# Patient Record
Sex: Male | Born: 1970 | Race: White | Hispanic: No | Marital: Married | Smoking: Former smoker
Health system: Southern US, Community
[De-identification: ages and names within clinical notes are randomized; demographics above are authoritative.]

## PROBLEM LIST (undated history)

## (undated) DIAGNOSIS — R519 Headache, unspecified: Secondary | ICD-10-CM

## (undated) DIAGNOSIS — Z9889 Other specified postprocedural states: Secondary | ICD-10-CM

## (undated) DIAGNOSIS — R112 Nausea with vomiting, unspecified: Secondary | ICD-10-CM

## (undated) DIAGNOSIS — E349 Endocrine disorder, unspecified: Secondary | ICD-10-CM

## (undated) DIAGNOSIS — F32A Depression, unspecified: Secondary | ICD-10-CM

## (undated) DIAGNOSIS — M1990 Unspecified osteoarthritis, unspecified site: Secondary | ICD-10-CM

## (undated) DIAGNOSIS — K5900 Constipation, unspecified: Secondary | ICD-10-CM

## (undated) DIAGNOSIS — T4145XA Adverse effect of unspecified anesthetic, initial encounter: Secondary | ICD-10-CM

## (undated) DIAGNOSIS — I10 Essential (primary) hypertension: Secondary | ICD-10-CM

## (undated) DIAGNOSIS — G4730 Sleep apnea, unspecified: Secondary | ICD-10-CM

## (undated) DIAGNOSIS — F319 Bipolar disorder, unspecified: Secondary | ICD-10-CM

## (undated) DIAGNOSIS — T8859XA Other complications of anesthesia, initial encounter: Secondary | ICD-10-CM

## (undated) DIAGNOSIS — S0990XA Unspecified injury of head, initial encounter: Secondary | ICD-10-CM

## (undated) DIAGNOSIS — K219 Gastro-esophageal reflux disease without esophagitis: Secondary | ICD-10-CM

## (undated) DIAGNOSIS — F419 Anxiety disorder, unspecified: Secondary | ICD-10-CM

## (undated) DIAGNOSIS — F909 Attention-deficit hyperactivity disorder, unspecified type: Secondary | ICD-10-CM

## (undated) DIAGNOSIS — F99 Mental disorder, not otherwise specified: Secondary | ICD-10-CM

## (undated) DIAGNOSIS — F329 Major depressive disorder, single episode, unspecified: Secondary | ICD-10-CM

## (undated) HISTORY — PX: WRIST ARTHROSCOPY: SHX838

## (undated) HISTORY — PX: WRIST SURGERY: SHX841

## (undated) HISTORY — PX: VASECTOMY: SHX75

## (undated) HISTORY — PX: HAND SURGERY: SHX662

## (undated) HISTORY — PX: MICRODISCECTOMY LUMBAR: SUR864

---

## 1996-03-14 HISTORY — PX: WRIST FUSION: SHX839

## 2000-02-28 ENCOUNTER — Ambulatory Visit: Admission: RE | Admit: 2000-02-28 | Discharge: 2000-02-28 | Payer: Self-pay | Admitting: Family Medicine

## 2000-03-03 ENCOUNTER — Encounter: Payer: Self-pay | Admitting: Family Medicine

## 2000-03-03 ENCOUNTER — Ambulatory Visit (HOSPITAL_COMMUNITY): Admission: RE | Admit: 2000-03-03 | Discharge: 2000-03-03 | Payer: Self-pay | Admitting: Family Medicine

## 2000-10-17 ENCOUNTER — Encounter: Admission: RE | Admit: 2000-10-17 | Discharge: 2000-10-17 | Payer: Self-pay | Admitting: Family Medicine

## 2000-10-17 ENCOUNTER — Encounter: Payer: Self-pay | Admitting: Family Medicine

## 2005-08-23 ENCOUNTER — Encounter: Admission: RE | Admit: 2005-08-23 | Discharge: 2005-08-23 | Payer: Self-pay | Admitting: Family Medicine

## 2007-04-24 ENCOUNTER — Encounter: Admission: RE | Admit: 2007-04-24 | Discharge: 2007-04-24 | Payer: Self-pay | Admitting: Family Medicine

## 2008-03-14 ENCOUNTER — Emergency Department (HOSPITAL_COMMUNITY): Admission: EM | Admit: 2008-03-14 | Discharge: 2008-03-15 | Payer: Self-pay | Admitting: Emergency Medicine

## 2008-03-27 ENCOUNTER — Ambulatory Visit (HOSPITAL_COMMUNITY): Admission: RE | Admit: 2008-03-27 | Discharge: 2008-03-27 | Payer: Self-pay | Admitting: Family Medicine

## 2009-12-12 ENCOUNTER — Emergency Department (HOSPITAL_COMMUNITY): Admission: EM | Admit: 2009-12-12 | Discharge: 2009-12-12 | Payer: Self-pay | Admitting: Emergency Medicine

## 2010-03-01 ENCOUNTER — Ambulatory Visit (HOSPITAL_COMMUNITY)
Admission: RE | Admit: 2010-03-01 | Discharge: 2010-03-02 | Payer: Self-pay | Source: Home / Self Care | Attending: Orthopaedic Surgery | Admitting: Orthopaedic Surgery

## 2010-04-22 ENCOUNTER — Emergency Department (HOSPITAL_COMMUNITY)
Admission: EM | Admit: 2010-04-22 | Discharge: 2010-04-23 | Disposition: A | Discharge status: Other Acute Inpatient Hospital | Payer: BC Managed Care – PPO | Attending: Emergency Medicine | Admitting: Emergency Medicine

## 2010-04-22 DIAGNOSIS — Z79899 Other long term (current) drug therapy: Secondary | ICD-10-CM | POA: Insufficient documentation

## 2010-04-22 DIAGNOSIS — R45851 Suicidal ideations: Secondary | ICD-10-CM | POA: Insufficient documentation

## 2010-04-22 DIAGNOSIS — F3112 Bipolar disorder, current episode manic without psychotic features, moderate: Secondary | ICD-10-CM

## 2010-04-22 DIAGNOSIS — F329 Major depressive disorder, single episode, unspecified: Secondary | ICD-10-CM | POA: Insufficient documentation

## 2010-04-22 DIAGNOSIS — F3289 Other specified depressive episodes: Secondary | ICD-10-CM | POA: Insufficient documentation

## 2010-04-22 LAB — COMPREHENSIVE METABOLIC PANEL
BUN: 10 mg/dL (ref 6–23)
CO2: 29 mEq/L (ref 19–32)
Calcium: 9.6 mg/dL (ref 8.4–10.5)
Creatinine, Ser: 1.18 mg/dL (ref 0.4–1.5)
GFR calc Af Amer: >60 mL/min (ref >60)
GFR calc non Af Amer: >60 mL/min (ref >60)
Glucose, Bld: 89 mg/dL (ref 70–99)

## 2010-04-22 LAB — CBC
MCV: 89.7 fL (ref 78.0–100.0)
Platelets: 228 10*3/uL (ref 150–400)
RBC: 4.55 MIL/uL (ref 4.22–5.81)
WBC: 5 10*3/uL (ref 4.0–10.5)

## 2010-04-22 LAB — RAPID URINE DRUG SCREEN, HOSP PERFORMED
Barbiturates: NOT DETECTED
Benzodiazepines: POSITIVE — AB
Cocaine: NOT DETECTED

## 2010-04-22 LAB — DIFFERENTIAL
Eosinophils Absolute: 0.1 10*3/uL (ref 0.0–0.7)
Lymphocytes Relative: 40 % (ref 12–46)
Lymphs Abs: 2 10*3/uL (ref 0.7–4.0)
Neutrophils Relative %: 49 % (ref 43–77)

## 2010-04-22 LAB — ETHANOL: Alcohol, Ethyl (B): <5 mg/dL (ref 0–10)

## 2010-04-23 ENCOUNTER — Inpatient Hospital Stay (HOSPITAL_COMMUNITY)
Admission: AD | Admit: 2010-04-23 | Discharge: 2010-04-27 | DRG: 430 | Disposition: A | Discharge status: Home / Self Care | Payer: BC Managed Care – PPO | Source: Ambulatory Visit | Attending: Psychiatry | Admitting: Psychiatry

## 2010-04-23 DIAGNOSIS — M549 Dorsalgia, unspecified: Secondary | ICD-10-CM

## 2010-04-23 DIAGNOSIS — R45851 Suicidal ideations: Secondary | ICD-10-CM

## 2010-04-23 DIAGNOSIS — R45850 Homicidal ideations: Secondary | ICD-10-CM

## 2010-04-23 DIAGNOSIS — Z6379 Other stressful life events affecting family and household: Secondary | ICD-10-CM

## 2010-04-23 DIAGNOSIS — G8929 Other chronic pain: Secondary | ICD-10-CM

## 2010-04-23 DIAGNOSIS — F39 Unspecified mood [affective] disorder: Principal | ICD-10-CM

## 2010-04-23 LAB — VALPROIC ACID LEVEL: Valproic Acid Lvl: 76.6 ug/mL (ref 50.0–100.0)

## 2010-04-24 DIAGNOSIS — F39 Unspecified mood [affective] disorder: Secondary | ICD-10-CM

## 2010-04-26 ENCOUNTER — Emergency Department (HOSPITAL_COMMUNITY)
Admission: EM | Admit: 2010-04-26 | Discharge: 2010-04-26 | Disposition: A | Discharge status: Other Acute Inpatient Hospital | Payer: BC Managed Care – PPO | Attending: Emergency Medicine | Admitting: Emergency Medicine

## 2010-04-26 ENCOUNTER — Emergency Department (HOSPITAL_COMMUNITY): Payer: BC Managed Care – PPO

## 2010-04-26 DIAGNOSIS — Y9367 Activity, basketball: Secondary | ICD-10-CM | POA: Insufficient documentation

## 2010-04-26 DIAGNOSIS — Y9210 Unspecified residential institution as the place of occurrence of the external cause: Secondary | ICD-10-CM | POA: Insufficient documentation

## 2010-04-26 DIAGNOSIS — F319 Bipolar disorder, unspecified: Secondary | ICD-10-CM | POA: Insufficient documentation

## 2010-04-26 DIAGNOSIS — IMO0002 Reserved for concepts with insufficient information to code with codable children: Secondary | ICD-10-CM | POA: Insufficient documentation

## 2010-04-26 DIAGNOSIS — W219XXA Striking against or struck by unspecified sports equipment, initial encounter: Secondary | ICD-10-CM | POA: Insufficient documentation

## 2010-05-02 NOTE — H&P (Signed)
NAMENOELLE, [name]                ACCOUNT NO.:  [id number]  MEDICAL RECORD NO.:  [id number]           PATIENT TYPE:  I  LOCATION:  0503                          FACILITY:  [facility]  PHYSICIAN:  Marlis Edelson, DO        DATE OF BIRTH:  May 09, 1970  DATE OF ADMISSION:  04/23/2010 DATE OF DISCHARGE:                      PSYCHIATRIC ADMISSION ASSESSMENT   This is a voluntary admission to the services of Dr. Huston Foley.  This is a 40 year old married white male.  He presented to the emergency department at [facility].  He reported that he was having an increase in anxiety and panic attacks.  He stated he was suicidal and homicidal without a plan.  He is employed as an Stage manager at [employer] and he had to leave a meeting because he felt like he was going to harm his coworkers. He has been having thoughts of hurting individuals who may look at him wrong or say the wrong things the wrong time.  He states he is at the end of a very tight rope and he is trying to hold on but he is losing his grip.  He is status post an L4-L5 microdiskectomy on March 01, 2010.  He has been totally off of pain pills now about 3 weeks but he states that his sleep is still off.  He is barely getting 6 hours of sleep a night and this is with Seroquel and Xanax.  His Depakote was cut back about 3 weeks ago because at 2000 mg a day he gets severe tremor. His tremor is better since decreasing it to 1500 mg and his Depakote level is therapeutic at 76.6.  PAST PSYCHIATRIC HISTORY:  He has seen Valinda Hoar, nurse practitioner, for mood disorder complaints for a number of years.  He is not sure how long.  He has 20 years ago cut his wrists when he was upset.  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  He is married twice, this time for 12 years.  He has a 49 year old daughter who is up at [employer] and a 72 year old son who lives with the patient.  He is employed as an Environmental consultant at  [employer].  FAMILY HISTORY:  His father and grandfather were alcoholics.  His grandmother was a substance abuse person.  ALCOHOL AND DRUG HISTORY:  He denies anything other than occasional social beer, one 12 ounce beer maybe 4 times a month.  PRIMARY CARE PROVIDER:  [facility] Sprang.  MEDICAL PROBLEMS:  He has chronic back pain for which he had an L4-L5 microdiskectomy on March 01, 2010.  MEDICATIONS: 1. His currently prescribed meds are Depakote 1500 mg at h.s. 2. Seroquel 25 mg at h.s. 3. Xanax 0.5 to 1 mg p.o. daily.  ALLERGIES:  He has no known drug allergies.  POSITIVE PHYSICAL FINDINGS:  He was afebrile.  His temperature was 97.4 to 98.3.  His pulse was 72 to 108, respirations were 18 to 20 and his blood pressure ranged from 113/74 to 131/84.  His Depakote level was therapeutic at 76.6.  He had no abnormalities of his BMET  nor his CBC. His UDS was positive for benzodiazepines but he is prescribed and he had no measurable alcohol.  He does walk gingerly, he is still having some back discomfort.  MENTAL STATUS EXAM:  Today he is alert and oriented.  He is appropriately groomed, dressed and nourished in his own clothing.  His speech is not pressured.  His mood is anxiously depressed.  Thought processes are clear, rational and goal oriented.  He is starting to second guess whether it was wise to be admitted.  Judgment and insight are intact.  Concentration and memory are intact.  Intelligence is at least average.  DIAGNOSES:  AXIS I:  Mood disorder not otherwise specified. AXIS II:  None. AXIS III:  Chronic back pain status post microdiskectomy L4-L5 on March 01, 2010. AXIS IV:  Moderate, some conflict at work. AXIS V:  36.  PLAN:  To admit for safety and stabilization.  Medication adjustments will be made as indicated.  He has taken Neurontin in the past with benefit and we are going to go ahead and restart that at 300 mg q.4 h while awake.  He was seen in the emergency  department by Dr. Rogers Blocker and Risperdal 1 mg p.o. b.i.d. was added to his regimen.  Estimated length of stay is 3 to 5 days.     Mickie Leonarda Salon, P.A.-C.   ______________________________ Marlis Edelson, DO    MD/MEDQ  D:  04/24/2010  T:  04/24/2010  Job:  [id number]  Electronically Signed by Jaci Lazier ADAMS P.A.-C. on 05/02/2010 12:36:38 PM Electronically Signed by Marlis Edelson MD on 05/02/2010 07:33:32 PM

## 2010-05-05 NOTE — Discharge Summary (Signed)
NAMENICHOALS, HEYDE NO.:  [id number]  MEDICAL RECORD NO.:  [id number]          PATIENT TYPE:  LOCATION:                                 FACILITY:  PHYSICIAN:  Marlis Edelson, DO             DATE OF BIRTH:  DATE OF ADMISSION:  04/23/2010 DATE OF DISCHARGE:                              DISCHARGE SUMMARY   PATIENT IDENTIFICATION:  This is a 40 year old male that was voluntarily admitted on 02/10-02/2011.  REASON FOR ADMISSION:  This is a of voluntary admission where patient reporting increased anxiety and panic attacks, suicidal and homicidal thoughts without a plan.  He was having thoughts of hurting individuals at work who he felt looked at him wrong or said the wrong things at the wrong time.  He was feeling very stressed, having mood swings.  Sleep has been poor.  The patient had been in outpatient treatment with Valinda Hoar for a history of mood disorder.  PERTINENT LABS:  Valproic acid level was at 76.6.  Alcohol level was less than 5.  His CMP was within normal limits.  Urine drug screen was positive for benzodiazepines.  FINAL DIAGNOSIS:  Mood disorder NOS.  SIGNIFICANT FINDINGS:  This is a middle-aged male who was casually dressed with good eye contact.  Normal motor behavior with normal speech, not pressured, not slurred.  Fully alert and appropriate affect. Had good and judgment and present insight.  Started the patient on Risperdal and added Rozerem for sleep.  The patient's mood became more stable.  He felt more composed.  PROCEDURES PERFORM AND TREATMENT RENDERED:  The patient had to go to the emergency room on February 13.  The patient was playing basketball and had thought his finger was broken.  He went to the emergency department and x-rays showed a fracture base of the middle phalanx little finger left hand.  A splint was applied.  The patient did receive a prescription for some pain medication with follow-up with a  hand specialist.  CONDITION OF THE PATIENT ON DISCHARGE:  The patient was fully alert, cooperative, casually dressed, good eye contact, was feeling much better with the medication.  Slept better on the Rozerem.  Felt more composed and more relaxed.  He was fully coherent.  No signs of mania or hypomania.  Did not appear to be overtly anxious or in any significant distress from the fracture.  The patient was seen by Dr. Huston Foley.  Mood was improved.  He felt he was doing well with his medications, and we felt the patient was stable for discharge.  DISCHARGE MEDICATIONS: 1. Neurontin 300 mg q.i.d. 2. Risperdal 1 mg daily, 2 mg at bedtime. 3. Naproxen 500 mg twice a day p.r.n. for pain. 4. Remeron 8 mg for sleep. 5. Depakote 1500 mg q.h.s.  FOLLOWUP:  The patient had an appointment with Valinda Hoar nurse practitioner on Wednesday February 22, and the patient was advised to see his hand specialist.  The patient reported having his own hand specialist that he would rather follow up with in 3 days for fracture of his  finger.  We also provided instructions in the emergency room, the patient requested a prescription for his pain medicine on discharge which we as well provided to the patient.  The patient was advised to return to the emergency department if he was having suicidal or homicidal thoughts or psychotic symptoms.     Landry Corporal, N.P.   ______________________________ Marlis Edelson, DO    JO/MEDQ  D:  04/28/2010  T:  04/28/2010  Job:  [id number]  Electronically Signed by Limmie PatriciaP. on 05/03/2010 09:49:54 AM Electronically Signed by Marlis Edelson MD on 05/05/2010 08:36:44 PM

## 2010-05-24 LAB — PROTIME-INR
INR: 0.94 (ref 0.00–1.49)
Prothrombin Time: 12.8 seconds (ref 11.6–15.2)

## 2010-05-24 LAB — BASIC METABOLIC PANEL
Calcium: 9.7 mg/dL (ref 8.4–10.5)
GFR calc Af Amer: >60 mL/min (ref >60)
GFR calc non Af Amer: >60 mL/min (ref >60)
Sodium: 142 mEq/L (ref 135–145)

## 2010-05-24 LAB — CBC
Hemoglobin: 14.2 g/dL (ref 13.0–17.0)
Platelets: 218 10*3/uL (ref 150–400)
RBC: 4.49 MIL/uL (ref 4.22–5.81)

## 2010-05-24 LAB — SURGICAL PCR SCREEN: MRSA, PCR: NEGATIVE

## 2010-06-28 LAB — CBC
HCT: 41.6 % (ref 39.0–52.0)
MCHC: 34.6 g/dL (ref 30.0–36.0)
Platelets: 244 10*3/uL (ref 150–400)
RDW: 13 % (ref 11.5–15.5)

## 2010-06-28 LAB — URINALYSIS, ROUTINE W REFLEX MICROSCOPIC
Bilirubin Urine: NEGATIVE
Glucose, UA: NEGATIVE mg/dL
Ketones, ur: 15 mg/dL — AB
Nitrite: NEGATIVE
Protein, ur: NEGATIVE mg/dL

## 2010-06-28 LAB — COMPREHENSIVE METABOLIC PANEL
Albumin: 4.1 g/dL (ref 3.5–5.2)
Alkaline Phosphatase: 55 U/L (ref 39–117)
BUN: 21 mg/dL (ref 6–23)
Calcium: 8.9 mg/dL (ref 8.4–10.5)
Glucose, Bld: 115 mg/dL — ABNORMAL HIGH (ref 70–99)
Potassium: 3.8 mEq/L (ref 3.5–5.1)
Total Protein: 6.7 g/dL (ref 6.0–8.3)

## 2010-06-28 LAB — DIFFERENTIAL
Lymphocytes Relative: 36 % (ref 12–46)
Lymphs Abs: 1.9 10*3/uL (ref 0.7–4.0)
Monocytes Absolute: 0.5 10*3/uL (ref 0.1–1.0)
Monocytes Relative: 9 % (ref 3–12)
Neutro Abs: 2.8 10*3/uL (ref 1.7–7.7)
Neutrophils Relative %: 52 % (ref 43–77)

## 2010-06-28 LAB — VALPROIC ACID LEVEL: Valproic Acid Lvl: 59.5 ug/mL (ref 50.0–100.0)

## 2010-10-12 ENCOUNTER — Other Ambulatory Visit: Payer: Self-pay | Admitting: Gastroenterology

## 2010-10-12 DIAGNOSIS — R112 Nausea with vomiting, unspecified: Secondary | ICD-10-CM

## 2010-10-12 DIAGNOSIS — K921 Melena: Secondary | ICD-10-CM

## 2010-10-12 DIAGNOSIS — R197 Diarrhea, unspecified: Secondary | ICD-10-CM

## 2010-10-15 ENCOUNTER — Ambulatory Visit
Admission: RE | Admit: 2010-10-15 | Discharge: 2010-10-15 | Disposition: A | Discharge status: Home / Self Care | Payer: BC Managed Care – PPO | Source: Ambulatory Visit | Attending: Gastroenterology | Admitting: Gastroenterology

## 2010-10-15 DIAGNOSIS — K921 Melena: Secondary | ICD-10-CM

## 2010-10-15 DIAGNOSIS — R112 Nausea with vomiting, unspecified: Secondary | ICD-10-CM

## 2010-10-15 DIAGNOSIS — R197 Diarrhea, unspecified: Secondary | ICD-10-CM

## 2010-10-15 MED ORDER — IOHEXOL 300 MG/ML  SOLN
125.0000 mL | Freq: Once | INTRAMUSCULAR | Status: AC | PRN
  Administered 2010-10-15: 125 mL via INTRAVENOUS

## 2011-03-22 ENCOUNTER — Other Ambulatory Visit: Payer: Self-pay | Admitting: Psychiatry

## 2011-03-22 DIAGNOSIS — R51 Headache: Secondary | ICD-10-CM

## 2011-03-24 ENCOUNTER — Ambulatory Visit
Admission: RE | Admit: 2011-03-24 | Discharge: 2011-03-24 | Disposition: A | Discharge status: Home / Self Care | Payer: BC Managed Care – PPO | Source: Ambulatory Visit | Attending: Psychiatry | Admitting: Psychiatry

## 2011-03-24 ENCOUNTER — Other Ambulatory Visit: Payer: BC Managed Care – PPO

## 2011-03-24 DIAGNOSIS — R51 Headache: Secondary | ICD-10-CM

## 2011-03-24 MED ORDER — GADOBENATE DIMEGLUMINE 529 MG/ML IV SOLN
20.0000 mL | Freq: Once | INTRAVENOUS | Status: AC | PRN
  Administered 2011-03-24: 20 mL via INTRAVENOUS

## 2011-03-26 ENCOUNTER — Other Ambulatory Visit: Payer: BC Managed Care – PPO

## 2011-07-22 ENCOUNTER — Emergency Department (HOSPITAL_COMMUNITY): Payer: BC Managed Care – PPO

## 2011-07-22 ENCOUNTER — Encounter (HOSPITAL_COMMUNITY): Payer: Self-pay | Admitting: *Deleted

## 2011-07-22 ENCOUNTER — Emergency Department (HOSPITAL_COMMUNITY)
Admission: EM | Admit: 2011-07-22 | Discharge: 2011-07-23 | Disposition: A | Discharge status: Home / Self Care | Payer: BC Managed Care – PPO | Attending: Emergency Medicine | Admitting: Emergency Medicine

## 2011-07-22 DIAGNOSIS — R5381 Other malaise: Secondary | ICD-10-CM | POA: Insufficient documentation

## 2011-07-22 DIAGNOSIS — R5383 Other fatigue: Secondary | ICD-10-CM | POA: Insufficient documentation

## 2011-07-22 DIAGNOSIS — R079 Chest pain, unspecified: Secondary | ICD-10-CM | POA: Insufficient documentation

## 2011-07-22 DIAGNOSIS — Z79899 Other long term (current) drug therapy: Secondary | ICD-10-CM | POA: Insufficient documentation

## 2011-07-22 DIAGNOSIS — R209 Unspecified disturbances of skin sensation: Secondary | ICD-10-CM | POA: Insufficient documentation

## 2011-07-22 LAB — POCT I-STAT TROPONIN I: Troponin i, poc: 0 ng/mL (ref 0.00–0.08)

## 2011-07-22 LAB — POCT I-STAT, CHEM 8
BUN: 15 mg/dL (ref 6–23)
Chloride: 102 mEq/L (ref 96–112)
Creatinine, Ser: 1 mg/dL (ref 0.50–1.35)
Potassium: 4.3 mEq/L (ref 3.5–5.1)
Sodium: 140 mEq/L (ref 135–145)
TCO2: 27 mmol/L (ref 0–100)

## 2011-07-22 NOTE — ED Notes (Signed)
Pt c/o CP since 4:30 today.  Started in left chest and was tight, has decreased and now is aching, some sharpness under chest.  SOB and weak with nausea, no diaphoresis.

## 2011-07-22 NOTE — ED Notes (Signed)
Patient transported to X-ray 

## 2011-07-23 LAB — CBC
MCV: 91.3 fL (ref 78.0–100.0)
Platelets: 287 10*3/uL (ref 150–400)
RBC: 4.71 MIL/uL (ref 4.22–5.81)
RDW: 12.5 % (ref 11.5–15.5)
WBC: 7.1 10*3/uL (ref 4.0–10.5)

## 2011-07-23 LAB — CARDIAC PANEL(CRET KIN+CKTOT+MB+TROPI)
CK, MB: 1.8 ng/mL (ref 0.3–4.0)
Total CK: 78 U/L (ref 7–232)
Troponin I: <0.3 ng/mL (ref <0.30)

## 2011-07-23 LAB — D-DIMER, QUANTITATIVE: D-Dimer, Quant: <0.22 ug/mL-FEU (ref 0.00–0.48)

## 2011-07-23 LAB — DIFFERENTIAL
Basophils Absolute: 0 10*3/uL (ref 0.0–0.1)
Eosinophils Relative: 4 % (ref 0–5)
Lymphocytes Relative: 39 % (ref 12–46)
Lymphs Abs: 2.8 10*3/uL (ref 0.7–4.0)
Neutro Abs: 3.5 10*3/uL (ref 1.7–7.7)
Neutrophils Relative %: 49 % (ref 43–77)

## 2011-07-23 MED ORDER — KETOROLAC TROMETHAMINE 30 MG/ML IJ SOLN
30.0000 mg | Freq: Once | INTRAMUSCULAR | Status: AC
  Administered 2011-07-23: 30 mg via INTRAVENOUS
  Filled 2011-07-23: qty 1

## 2011-07-23 NOTE — ED Notes (Signed)
D/c instructions reviewed w/ pt and family - pt and family deny any further questions or concerns at present.\ 

## 2011-07-23 NOTE — ED Notes (Signed)
Pt reports left sided chest pain radiating to left arm that began appox 1630 yesterday - pt reports pain worse w/ movement and deep inspiration - pt in no acute distress on assessment rates pain approx 3/10 on pain scale. Pt states he had some nausea denies vomiting - states he felt hot and diaphoretic on onset of symptoms. Family at bedside x1 - pt in no acute distress, resting comfortably in bed.

## 2011-07-23 NOTE — Discharge Instructions (Signed)
Your workup today has not shown a specific cause for your symptoms. Please followup with equal cardiology as directed. Take a full dose aspirin each day. Return to the emergency for worsening condition or new concerning symptoms.  Aspirin and Your Heart Aspirin affects the way your blood clots and helps "thin" the blood. Aspirin has many uses in heart disease. It may be used as a primary prevention to help reduce the risk of heart related events. It also can be used as a secondary measure to prevent more heart attacks or to prevent additional damage from blood clots.  ASPIRIN MAY HELP IF YOU:  Have had a heart attack or chest pain.   Have undergone open heart surgery such as CABG (Coronary Artery Bypass Surgery).   Have had coronary angioplasty with or without stents.   Have experienced a stroke or TIA (transient ischemic attack).   Have peripheral vascular disease (PAD).   Have chronic heart rhythm problems such as atrial fibrillation.   Are at risk for heart disease.  BEFORE STARTING ASPIRIN Before you start taking aspirin, your caregiver will need to review your medical history. Many things will need to be taken into consideration, such as:  Smoking status.   Blood pressure.   Diabetes.   Gender.   Weight.   Cholesterol level.  ASPIRIN DOSES  Aspirin should only be taken on the advice of your caregiver. Talk to your caregiver about how much aspirin you should take. Aspirin comes in different doses such as:   81 mg.   162 mg.   325 mg.   The aspirin dose you take may be affected by many factors, some of which include:   Your current medications, especially if your are taking blood-thinners or anti-platelet medicine.   Liver function.   Heart disease risk.   Age.   Aspirin comes in two forms:   Non-enteric-coated. This type of aspirin does not have a coating and is absorbed faster. Non-enteric coated aspirin is recommended for patients experiencing chest pain  symptoms. This type of aspirin also comes in a chewable form.   Enteric-coated. This means the aspirin has a special coating that releases the medicine very slowly. Enteric-coated aspirin causes less stomach upset. This type of aspirin should not be chewed or crushed.  ASPIRIN SIDE EFFECTS Daily use of aspirin can increase your risk of serious side effects, some of these include:  Increased bleeding. This can range from a cut that does not stop bleeding to more serious problems such as stomach bleeding or bleeding into the brain (Intracerebral bleeding).   Increased bruising.   Stomach upset.   An allergic reaction such as red, itchy skin.   Increased risk of bleeding when combined with non-steroidal anti-inflammatory medicine (NSAIDS).   Alcohol should be drank in moderation when taking aspirin. Alcohol can increase the risk of stomach bleeding when taken with aspirin.   Aspirin should not be given to children less than 47 years of age due to the association of Reye syndrome. Reye syndrome is a serious illness that can affect the brain and liver. Studies have linked Reye syndrome with aspirin use in children.   People that have nasal polyps have an increased risk of developing an aspirin allergy.  SEEK MEDICAL CARE IF:   You develop an allergic reaction such as:   Hives.   Itchy skin.   Swelling of the lips, tongue or face.   You develop stomach pain.   You have unusual bleeding or bruising.  You have ringing in your ears.  SEEK IMMEDIATE MEDICAL CARE IF:   You have severe chest pain, especially if the pain is crushing or pressure-like and spreads to the arms, back, neck, or jaw. THIS IS AN EMERGENCY. Do not wait to see if the pain will go away. Get medical help at once. Call your local emergency services (911 in the [place] DO NOT drive yourself to the hospital.   You have stroke-like symptoms such as:   Loss of vision.   Difficulty talking.   Numbness or weakness on  one side of your body.   Numbness or weakness in your arm or leg.   Not thinking clearly or feeling confused.   Your bowel movements are bloody, dark red or black in color.   You vomit or cough up blood.   You have blood in your urine.   You have shortness of breath, coughing or wheezing.  MAKE SURE YOU:   Understand these instructions.   Will monitor your condition.   Seek immediate medical care if necessary.  Document Released: 02/11/2008 Document Revised: 02/17/2011 Document Reviewed: 02/11/2008 [facility] Patient Information 2012 [place], [place].  Chest Pain (Nonspecific) It is often hard to give a specific diagnosis for the cause of chest pain. There is always a chance that your pain could be related to something serious, such as a heart attack or a blood clot in the lungs. You need to follow up with your caregiver for further evaluation. CAUSES   Heartburn.   Pneumonia or bronchitis.   Anxiety or stress.   Inflammation around your heart (pericarditis) or lung (pleuritis or pleurisy).   A blood clot in the lung.   A collapsed lung (pneumothorax). It can develop suddenly on its own (spontaneous pneumothorax) or from injury (trauma) to the chest.   Shingles infection (herpes zoster virus).  The chest wall is composed of bones, muscles, and cartilage. Any of these can be the source of the pain.  The bones can be bruised by injury.   The muscles or cartilage can be strained by coughing or overwork.   The cartilage can be affected by inflammation and become sore (costochondritis).  DIAGNOSIS  Lab tests or other studies, such as X-rays, electrocardiography, stress testing, or cardiac imaging, may be needed to find the cause of your pain.  TREATMENT   Treatment depends on what may be causing your chest pain. Treatment may include:   Acid blockers for heartburn.   Anti-inflammatory medicine.   Pain medicine for inflammatory conditions.   Antibiotics if an  infection is present.   You may be advised to change lifestyle habits. This includes stopping smoking and avoiding alcohol, caffeine, and chocolate.   You may be advised to keep your head raised (elevated) when sleeping. This reduces the chance of acid going backward from your stomach into your esophagus.   Most of the time, nonspecific chest pain will improve within 2 to 3 days with rest and mild pain medicine.  HOME CARE INSTRUCTIONS   If antibiotics were prescribed, take your antibiotics as directed. Finish them even if you start to feel better.   For the next few days, avoid physical activities that bring on chest pain. Continue physical activities as directed.   Do not smoke.   Avoid drinking alcohol.   Only take over-the-counter or prescription medicine for pain, discomfort, or fever as directed by your caregiver.   Follow your caregiver's suggestions for further testing if your chest pain does not go away.  Keep any follow-up appointments you made. If you do not go to an appointment, you could develop lasting (chronic) problems with pain. If there is any problem keeping an appointment, you must call to reschedule.  SEEK MEDICAL CARE IF:   You think you are having problems from the medicine you are taking. Read your medicine instructions carefully.   Your chest pain does not go away, even after treatment.   You develop a rash with blisters on your chest.  SEEK IMMEDIATE MEDICAL CARE IF:   You have increased chest pain or pain that spreads to your arm, neck, jaw, back, or abdomen.   You develop shortness of breath, an increasing cough, or you are coughing up blood.   You have severe back or abdominal pain, feel nauseous, or vomit.   You develop severe weakness, fainting, or chills.   You have a fever.  THIS IS AN EMERGENCY. Do not wait to see if the pain will go away. Get medical help at once. Call your local emergency services (911 in [place] Do not drive yourself to the  hospital. MAKE SURE YOU:   Understand these instructions.   Will watch your condition.   Will get help right away if you are not doing well or get worse.  Document Released: 12/08/2004 Document Revised: 02/17/2011 Document Reviewed: 10/04/2007 [facility] Patient Information 2012 [place], [place].

## 2011-07-25 NOTE — ED Provider Notes (Signed)
History     CSN: [id number]  Arrival date & time 07/22/11  2125   First MD Initiated Contact with Patient 07/22/11 2351      Chief Complaint  Patient presents with  . Chest Pain    (Consider location/radiation/quality/duration/timing/severity/associated sxs/prior treatment) HPI Redo-year-old male presents to emergency room complaining of chest pain. Pain has been ongoing for the last several months as a dull ache with occasional sharp pinching sensation. This pain has been intermittent lasting minutes to hours at a time but always resolving. Today around 4:30 he had consistent pain and some tingling in his left hand. Patient was seen at urgent care and referred on to the emergency department. Patient describes tightness across his chest, some aching he denies any cough, no leg swelling, no history of DVT or PE in himself or family. He has had no diaphoresis. Patient has had some generalized weakness but no nausea no shortness of breath. Patient reports he's not taking a full breath at times due tightness in his chest. Patient without prior history of coronary disease. He reports remote history of hypertension when he was overweight but is since been normal but normotensive since losing weight. Patient does have significant family history of coronary disease with his father with an MI in his 33s. Patient reports symptoms occurred today while he was putting on a cigar. He does not smoke on a regular basis.  History reviewed. No pertinent past medical history.  Past Surgical History  Procedure Date  . Hand surgery     right surgery  . Microdiscectomy lumbar     L4-5    History reviewed. No pertinent family history.  History  Substance Use Topics  . Smoking status: Current Everyday Smoker    Types: Cigars  . Smokeless tobacco: Not on file  . Alcohol Use: No      Review of Systems  All other systems reviewed and are negative.    Allergies  Review of patient's allergies  indicates no known allergies.  Home Medications   Current Outpatient Rx  Name Route Sig Dispense Refill  . DIVALPROEX SODIUM ER 500 MG PO TB24 Oral Take 1,500 mg by mouth at bedtime.    . OMEGA-3 FATTY ACIDS 1000 MG PO CAPS Oral Take 2 g by mouth daily.    Marland Kitchen HYDROCODONE-ACETAMINOPHEN 7.5-325 MG PO TABS Oral Take 2 tablets by mouth every 6 (six) hours as needed. For back pain    . LAMOTRIGINE 100 MG PO TABS Oral Take 100 mg by mouth 2 (two) times daily.      BP 110/67  Pulse 66  Temp(Src) 98.4 F (36.9 C) (Oral)  Resp 12  SpO2 99%  Physical Exam  Nursing note and vitals reviewed. Constitutional: He is oriented to person, place, and time. He appears well-developed and well-nourished.  HENT:  Head: Normocephalic and atraumatic.  Right Ear: External ear normal.  Left Ear: External ear normal.  Nose: Nose normal.  Mouth/Throat: Oropharynx is clear and moist.  Eyes: Conjunctivae and EOM are normal. Pupils are equal, round, and reactive to light.  Neck: Normal range of motion. Neck supple. No JVD present. No tracheal deviation present. No thyromegaly present.  Cardiovascular: Normal rate, regular rhythm, normal heart sounds and intact distal pulses.  Exam reveals no gallop and no friction rub.   No murmur heard. Pulmonary/Chest: Effort normal and breath sounds normal. No stridor. No respiratory distress. He has no wheezes. He has no rales. He exhibits no tenderness.  Abdominal: Soft. Bowel  sounds are normal. He exhibits no distension and no mass. There is no tenderness. There is no rebound and no guarding.  Musculoskeletal: Normal range of motion. He exhibits no edema and no tenderness.  Lymphadenopathy:    He has no cervical adenopathy.  Neurological: He is oriented to person, place, and time. He has normal reflexes. No cranial nerve deficit. He exhibits normal muscle tone. Coordination normal.  Skin: Skin is dry. No rash noted. No erythema. No pallor.  Psychiatric: He has a normal  mood and affect. His behavior is normal. Judgment and thought content normal.    ED Course  Procedures (including critical care time)  Results for orders placed during the hospital encounter of 07/22/11  CBC      Component Value Range   WBC 7.1  4.0 - 10.5 (K/uL)   RBC 4.71  4.22 - 5.81 (MIL/uL)   Hemoglobin 15.2  13.0 - 17.0 (g/dL)   HCT 16.1  09.6 - 04.5 (%)   MCV 91.3  78.0 - 100.0 (fL)   MCH 32.3  26.0 - 34.0 (pg)   MCHC 35.3  30.0 - 36.0 (g/dL)   RDW 40.9  81.1 - 91.4 (%)   Platelets 287  150 - 400 (K/uL)  DIFFERENTIAL      Component Value Range   Neutrophils Relative 49  43 - 77 (%)   Neutro Abs 3.5  1.7 - 7.7 (K/uL)   Lymphocytes Relative 39  12 - 46 (%)   Lymphs Abs 2.8  0.7 - 4.0 (K/uL)   Monocytes Relative 8  3 - 12 (%)   Monocytes Absolute 0.5  0.1 - 1.0 (K/uL)   Eosinophils Relative 4  0 - 5 (%)   Eosinophils Absolute 0.3  0.0 - 0.7 (K/uL)   Basophils Relative 0  0 - 1 (%)   Basophils Absolute 0.0  0.0 - 0.1 (K/uL)  POCT I-STAT, CHEM 8      Component Value Range   Sodium 140  135 - 145 (mEq/L)   Potassium 4.3  3.5 - 5.1 (mEq/L)   Chloride 102  96 - 112 (mEq/L)   BUN 15  6 - 23 (mg/dL)   Creatinine, Ser 7.82  0.50 - 1.35 (mg/dL)   Glucose, Bld 84  70 - 99 (mg/dL)   Calcium, Ion 9.56  2.13 - 1.32 (mmol/L)   TCO2 27  0 - 100 (mmol/L)   Hemoglobin 15.6  13.0 - 17.0 (g/dL)   HCT 08.6  57.8 - 46.9 (%)  POCT I-STAT TROPONIN I      Component Value Range   Troponin i, poc 0.00  0.00 - 0.08 (ng/mL)   Comment 3           D-DIMER, QUANTITATIVE      Component Value Range   D-Dimer, Quant <0.22  0.00 - 0.48 (ug/mL-FEU)  CARDIAC PANEL(CRET KIN+CKTOT+MB+TROPI)      Component Value Range   Total CK 78  7 - 232 (U/L)   CK, MB 1.8  0.3 - 4.0 (ng/mL)   Troponin I <0.30  <0.30 (ng/mL)   Relative Index RELATIVE INDEX IS INVALID  0.0 - 2.5     Results for orders placed during the hospital encounter of 07/22/11  CBC      Component Value Range   WBC 7.1  4.0 - 10.5  (K/uL)   RBC 4.71  4.22 - 5.81 (MIL/uL)   Hemoglobin 15.2  13.0 - 17.0 (g/dL)   HCT 62.9  52.8 - 41.3 (%)  MCV 91.3  78.0 - 100.0 (fL)   MCH 32.3  26.0 - 34.0 (pg)   MCHC 35.3  30.0 - 36.0 (g/dL)   RDW 78.2  95.6 - 21.3 (%)   Platelets 287  150 - 400 (K/uL)  DIFFERENTIAL      Component Value Range   Neutrophils Relative 49  43 - 77 (%)   Neutro Abs 3.5  1.7 - 7.7 (K/uL)   Lymphocytes Relative 39  12 - 46 (%)   Lymphs Abs 2.8  0.7 - 4.0 (K/uL)   Monocytes Relative 8  3 - 12 (%)   Monocytes Absolute 0.5  0.1 - 1.0 (K/uL)   Eosinophils Relative 4  0 - 5 (%)   Eosinophils Absolute 0.3  0.0 - 0.7 (K/uL)   Basophils Relative 0  0 - 1 (%)   Basophils Absolute 0.0  0.0 - 0.1 (K/uL)  POCT I-STAT, CHEM 8      Component Value Range   Sodium 140  135 - 145 (mEq/L)   Potassium 4.3  3.5 - 5.1 (mEq/L)   Chloride 102  96 - 112 (mEq/L)   BUN 15  6 - 23 (mg/dL)   Creatinine, Ser 0.86  0.50 - 1.35 (mg/dL)   Glucose, Bld 84  70 - 99 (mg/dL)   Calcium, Ion 5.78  4.69 - 1.32 (mmol/L)   TCO2 27  0 - 100 (mmol/L)   Hemoglobin 15.6  13.0 - 17.0 (g/dL)   HCT 62.9  52.8 - 41.3 (%)  POCT I-STAT TROPONIN I      Component Value Range   Troponin i, poc 0.00  0.00 - 0.08 (ng/mL)   Comment 3           D-DIMER, QUANTITATIVE      Component Value Range   D-Dimer, Quant <0.22  0.00 - 0.48 (ug/mL-FEU)  CARDIAC PANEL(CRET KIN+CKTOT+MB+TROPI)      Component Value Range   Total CK 78  7 - 232 (U/L)   CK, MB 1.8  0.3 - 4.0 (ng/mL)   Troponin I <0.30  <0.30 (ng/mL)   Relative Index RELATIVE INDEX IS INVALID  0.0 - 2.5    Dg Chest 2 View  07/23/2011  *RADIOLOGY REPORT*  Clinical Data: Chest pain and tightness radiating to the left arm.  CHEST - 2 VIEW  Comparison: 01/23/2010  Findings: Shallow inspiration with elevation of the left hemidiaphragm.  Normal heart size and pulmonary vascularity.  No focal airspace consolidation in the lungs.  No blunting of costophrenic angles.  No pneumothorax.  No significant  change since the previous study.  IMPRESSION: No evidence of active pulmonary disease.  Original Report Authenticated By: Marlon Pel, M.D.      Date: 07/22/2011  Rate: 83  Rhythm: normal sinus rhythm  QRS Axis: normal  Intervals: normal  ST/T Wave abnormalities: normal  Conduction Disutrbances:none  Narrative Interpretation:   Old EKG Reviewed: unchanged    1. Chest pain       MDM  41 yo male with chest pain that seems atypical in nature, only risk factor is family history.  Timi score 0.  D/w Cardiology for close follow up outpatient.        Olivia Mackie, MD 07/25/11 [phone]

## 2011-12-06 ENCOUNTER — Other Ambulatory Visit: Payer: Self-pay | Admitting: Endocrinology

## 2011-12-06 DIAGNOSIS — E041 Nontoxic single thyroid nodule: Secondary | ICD-10-CM

## 2011-12-09 ENCOUNTER — Other Ambulatory Visit: Payer: BC Managed Care – PPO

## 2012-01-02 ENCOUNTER — Ambulatory Visit
Admission: RE | Admit: 2012-01-02 | Discharge: 2012-01-02 | Disposition: A | Discharge status: Home / Self Care | Payer: BC Managed Care – PPO | Source: Ambulatory Visit | Attending: Endocrinology | Admitting: Endocrinology

## 2012-01-02 DIAGNOSIS — E041 Nontoxic single thyroid nodule: Secondary | ICD-10-CM

## 2012-05-01 ENCOUNTER — Other Ambulatory Visit: Payer: Self-pay | Admitting: Otolaryngology

## 2012-05-01 DIAGNOSIS — R51 Headache: Secondary | ICD-10-CM

## 2012-09-24 ENCOUNTER — Other Ambulatory Visit: Payer: Self-pay | Admitting: *Deleted

## 2012-09-24 DIAGNOSIS — E291 Testicular hypofunction: Secondary | ICD-10-CM

## 2012-09-25 ENCOUNTER — Other Ambulatory Visit (INDEPENDENT_AMBULATORY_CARE_PROVIDER_SITE_OTHER): Payer: BC Managed Care – PPO

## 2012-09-25 DIAGNOSIS — E291 Testicular hypofunction: Secondary | ICD-10-CM

## 2012-10-01 ENCOUNTER — Other Ambulatory Visit: Payer: Self-pay | Admitting: *Deleted

## 2012-10-05 ENCOUNTER — Ambulatory Visit (INDEPENDENT_AMBULATORY_CARE_PROVIDER_SITE_OTHER): Payer: BC Managed Care – PPO | Admitting: Endocrinology

## 2012-10-05 ENCOUNTER — Encounter: Payer: Self-pay | Admitting: Endocrinology

## 2012-10-05 VITALS — BP 124/78 | HR 94 | Temp 98.3°F | Resp 12 | Ht 73.0 in | Wt 222.2 lb

## 2012-10-05 DIAGNOSIS — E230 Hypopituitarism: Secondary | ICD-10-CM | POA: Insufficient documentation

## 2012-10-05 DIAGNOSIS — E291 Testicular hypofunction: Secondary | ICD-10-CM

## 2012-10-05 MED ORDER — TESTOSTERONE 4 MG/24HR TD PT24: 1.0000 | MEDICATED_PATCH | Freq: Every day | TRANSDERMAL | Status: DC

## 2012-10-05 NOTE — Progress Notes (Signed)
Patient ID: Shawn Cervantes, male   DOB: 06-08-1970, 42 y.o.   MRN: [id number]   History of Present Illness:  Followup of HYPOGONADISM   About 2 years prior to his diagnosis he started noticing increasing tiredness especially in the evening. Also was having decreased exercise capacity, decreased libido and some depression. Baseline levels of testosterone were low He was diagnosed to have hypogonadotropic hypogonadism  He did feel significantly better with starting the AndroGel initially.   The lab findings have included PRETREATMENT level of 228 total and 6.3 free ( normal >6.8) on 08/27/10. LH, prolactin in 9/13 were normal. .  Medications that the patient has used include fortesta, Axiron, Androgel.   He was previously on AndroGel and on 2 pumps a day his testosterone level in 1/13 was 293. In 2/13 the level had improved to 588 with 3 pumps daily.   He preferred the Axiron because of decreased risk of transference. With 2 pumps of Axiron since 4/13 his level was 389. Also with using 2 pumps twice a day his level was relatively low at 3.03 and his regimen was changed again.  He has been on 4-6 applications of Fortesta with variable results and has not been in the normal range Now he has less fatigue and increased muscle strength and libido with current regimen of using Fortesta 3 applications on each side.  His last testosterone level was 1192 using 6 pumps a day; with using 4 pumps the level was 118 with a different assay.   His testosterone level is now again low with reducing the dose to 4 pumps a day. He complains of some loss of energy and decreased libido but has gained 10 pounds and has not been exercising  No visits with results within 1 Week(s) from this visit. Latest known visit with results is:  Lab on 09/25/2012  Component Date Value Range Status  . Testosterone 09/25/2012 154.43* 350.00 - 890.00 ng/dL Final      Medication List       This list is accurate as of: 10/05/12   3:46 PM.  Always use your most recent med list.               amphetamine-dextroamphetamine 15 MG tablet  Commonly known as:  ADDERALL     clonazePAM 0.5 MG tablet  Commonly known as:  KLONOPIN     cyclobenzaprine 10 MG tablet  Commonly known as:  FLEXERIL     diclofenac 75 MG EC tablet  Commonly known as:  VOLTAREN     divalproex 500 MG 24 hr tablet  Commonly known as:  DEPAKOTE ER  Take 1,500 mg by mouth at bedtime.     doxycycline 100 MG tablet  Commonly known as:  VIBRA-TABS     fish oil-omega-3 fatty acids 1000 MG capsule  Take 2 g by mouth daily.     FORTESTA 10 MG/ACT (2%) Gel  Generic drug:  Testosterone     HYDROcodone-acetaminophen 7.5-325 MG per tablet  Commonly known as:  NORCO  Take 2 tablets by mouth every 6 (six) hours as needed. For back pain     lamoTRIgine 100 MG tablet  Commonly known as:  LAMICTAL  Take 100 mg by mouth 2 (two) times daily.     predniSONE 10 MG tablet  Commonly known as:  DELTASONE        Allergies: No Known Allergies  No past medical history on file.  Past Surgical History  Procedure Laterality Date  .  Hand surgery      right surgery  . Microdiscectomy lumbar      L4-5    No family history on file.  Social History:  reports that he has been smoking Cigars.  He does not have any smokeless tobacco history on file. He reports that he does not drink alcohol. His drug history is not on file.  Review of Systems -   No history of diabetes or glucose intolerance   Assessment/Plan:   Since he has had inconsistent levels with Azucena Freed will switch him to Androderm 4 mg patch daily He will have another lab test in about 6 weeks and decide on dosage at that time Also encouraged him to resume his exercise

## 2012-10-05 NOTE — Patient Instructions (Signed)
Change to Androderm 4 mg daily

## 2012-11-05 ENCOUNTER — Other Ambulatory Visit: Payer: Self-pay | Admitting: *Deleted

## 2012-11-05 DIAGNOSIS — E230 Hypopituitarism: Secondary | ICD-10-CM

## 2012-11-09 ENCOUNTER — Other Ambulatory Visit: Payer: BC Managed Care – PPO

## 2012-11-26 ENCOUNTER — Other Ambulatory Visit: Payer: BC Managed Care – PPO

## 2012-11-26 DIAGNOSIS — E230 Hypopituitarism: Secondary | ICD-10-CM

## 2012-11-27 LAB — TESTOSTERONE, FREE, TOTAL, SHBG
Testosterone-% Free: 2.1 % (ref 1.6–2.9)
Testosterone: 384 ng/dL (ref 300–890)

## 2012-12-04 ENCOUNTER — Encounter: Payer: Self-pay | Admitting: Endocrinology

## 2012-12-04 ENCOUNTER — Ambulatory Visit (INDEPENDENT_AMBULATORY_CARE_PROVIDER_SITE_OTHER): Payer: BC Managed Care – PPO | Admitting: Endocrinology

## 2012-12-04 VITALS — BP 120/84 | HR 80 | Temp 97.9°F | Wt 221.0 lb

## 2012-12-04 DIAGNOSIS — Z23 Encounter for immunization: Secondary | ICD-10-CM

## 2012-12-04 DIAGNOSIS — E230 Hypopituitarism: Secondary | ICD-10-CM

## 2012-12-04 DIAGNOSIS — E291 Testicular hypofunction: Secondary | ICD-10-CM

## 2012-12-04 MED ORDER — CLOMIPHENE CITRATE 50 MG PO TABS: 50.0000 mg | ORAL_TABLET | Freq: Every day | ORAL | Status: DC

## 2012-12-04 NOTE — Progress Notes (Signed)
Patient ID: Shawn Cervantes, male   DOB: 27-Jul-1970, 42 y.o.   MRN: [id number]   History of Present Illness:  Followup of HYPOGONADISM    Past history: About 2 years prior to his diagnosis he started noticing increasing tiredness especially in the evening. Also was having decreased exercise capacity, decreased libido and some depression. He was diagnosed to have hypogonadotropic hypogonadism  He did feel significantly better with starting the AndroGel initially.   The lab findings have included PRETREATMENT testosterone level of 228 total and 6.3 free ( normal >6.8) on 08/27/10.  LH, prolactin in 9/13 were normal. .   Medications that the patient has used include fortesta, Axiron, Androgel.  On AndroGel and on 2 pumps a day his testosterone level in 1/13 was 293. In 2/13 the level had improved to 588 with 3 pumps daily.  With 2 pumps of Axiron since 4/13 his level was 389. Also with using 2 pumps twice a day his level was relatively low at 3.03 and his regimen was changed again.  On 4-6 applications of Fortesta daily had variable results and has not been in the normal range His last testosterone level was 1192 using 6 pumps a day; with using 4 pumps the level was 118 with a different assay.   Most recently because of lower levels with 4 pumps a day he was switched to Androderm 4 mg However  he now complains of some loss of energy and decreased libido but has not been exercising Has not been making efforts to lose weight with better diet also He does note that he and his wife want to start a family now  Wt Readings from Last 3 Encounters:  12/04/12 221 lb (100.245 kg)  10/05/12 222 lb 3.2 oz (100.789 kg)     No visits with results within 1 Week(s) from this visit. Latest known visit with results is:  Appointment on 11/26/2012  Component Date Value Range Status  . Testosterone 11/26/2012 384  300 - 890 ng/dL Final   Comment:           Tanner Stage       Male              Male                                 I              < 30 ng/dL        < 10 ng/dL                                        II             < 150 ng/dL       < 30 ng/dL                                        III            100-320 ng/dL     < 35 ng/dL  IV             200-970 ng/dL     98-11 ng/dL                                        V/Adult        300-890 ng/dL     91-47 ng/dL                             . Sex Hormone Binding 11/26/2012 31  13 - 71 nmol/L Final  . Testosterone, Free 11/26/2012 80.5  47.0 - 244.0 pg/mL Final   Comment:                            The concentration of free testosterone is derived from a mathematical                          expression based on constants for the binding of testosterone to sex                          hormone-binding globulin and albumin.  . Testosterone-% Freee. 11/26/2012 2.1  1.6 - 2.9 % Final      Medication List       This list is accurate as of: 12/04/12  9:07 AM.  Always use your most recent med list.               amphetamine-dextroamphetamine 15 MG tablet  Commonly known as:  ADDERALL     clonazePAM 0.5 MG tablet  Commonly known as:  KLONOPIN     cyclobenzaprine 10 MG tablet  Commonly known as:  FLEXERIL     diclofenac 75 MG EC tablet  Commonly known as:  VOLTAREN     divalproex 500 MG 24 hr tablet  Commonly known as:  DEPAKOTE ER  Take 1,500 mg by mouth at bedtime.     fish oil-omega-3 fatty acids 1000 MG capsule  Take 2 g by mouth daily.     lamoTRIgine 100 MG tablet  Commonly known as:  LAMICTAL  Take 100 mg by mouth 2 (two) times daily.     predniSONE 10 MG tablet  Commonly known as:  DELTASONE     testosterone 4 MG/24HR Pt24 patch  Commonly known as:  ANDRODERM  Place 1 patch onto the skin daily.     traMADol 50 MG tablet  Commonly known as:  ULTRAM        Allergies: No Known Allergies  No past medical history on file.  Past Surgical History  Procedure Laterality  Date  . Hand surgery      right surgery  . Microdiscectomy lumbar      L4-5    No family history on file.  Social History:  reports that he has been smoking Cigars.  He does not have any smokeless tobacco history on file. He reports that he does not drink alcohol. His drug history is not on file.  Review of Systems -   No history of diabetes or glucose intolerance No history of hypertension, blood pressure high normal    Assessment/Plan:   Since he has been on Androderm 4 mg patch daily he does not feel subjectively as good and  this may be related to his level being lower than before although still in the therapeutic range. Since he is interested in fertility will go head and switch him to clomiphene low dose and check testosterone level in a month. May consider 6 mg of Androderm subsequently Also encouraged him to resume his exercise when he can and also improve his diet for weight loss

## 2012-12-04 NOTE — Patient Instructions (Signed)
No visits with results within 1 Week(s) from this visit. Latest known visit with results is:  Appointment on 11/26/2012  Component Date Value Range Status  . Testosterone 11/26/2012 384  300 - 890 ng/dL Final   Comment:           Tanner Stage       Male              Male                                        I              < 30 ng/dL        < 10 ng/dL                                        II             < 150 ng/dL       < 30 ng/dL                                        III            100-320 ng/dL     < 35 ng/dL                                        IV             200-970 ng/dL     16-10 ng/dL                                        V/Adult        300-890 ng/dL     96-04 ng/dL                             . Sex Hormone Binding 11/26/2012 31  13 - 71 nmol/L Final  . Testosterone, Free 11/26/2012 80.5  47.0 - 244.0 pg/mL Final   Comment:                            The concentration of free testosterone is derived from a mathematical                          expression based on constants for the binding of testosterone to sex                          hormone-binding globulin and albumin.  . Testosterone-% Freee. 11/26/2012 2.1  1.6 - 2.9 % Final   Clomiphene 50 mg 1/2 tab 3x week Lab 1 mth

## 2013-01-04 ENCOUNTER — Other Ambulatory Visit: Payer: BC Managed Care – PPO

## 2013-01-28 ENCOUNTER — Ambulatory Visit (INDEPENDENT_AMBULATORY_CARE_PROVIDER_SITE_OTHER): Payer: BC Managed Care – PPO | Admitting: Endocrinology

## 2013-01-28 ENCOUNTER — Telehealth: Payer: Self-pay | Admitting: Endocrinology

## 2013-01-28 ENCOUNTER — Encounter: Payer: Self-pay | Admitting: Endocrinology

## 2013-01-28 VITALS — BP 110/76 | HR 79 | Temp 98.4°F | Resp 12 | Ht 73.0 in | Wt 222.7 lb

## 2013-01-28 DIAGNOSIS — E291 Testicular hypofunction: Secondary | ICD-10-CM

## 2013-01-28 DIAGNOSIS — E230 Hypopituitarism: Secondary | ICD-10-CM

## 2013-01-28 NOTE — Progress Notes (Signed)
Patient ID: Shawn Cervantes, male   DOB: [birth year], 42 y.o.   MRN: [id number]   Chief complaint: Followup of HYPOGONADISM   History of Present Illness:    Past history: About 2 years prior to his diagnosis he started noticing increasing tiredness especially in the evening. Also was having decreased exercise capacity, decreased libido and some depression. He was diagnosed to have hypogonadotropic hypogonadism  He did feel significantly better with starting supplementation with AndroGel initially.  The lab findings have included PRE-TREATMENT testosterone level of 228 total and 6.3 free ( normal >6.8) on 08/27/10.  LH, prolactin in 9/13 were normal. .   Medications that the patient has used include fortesta, Axiron, Androgel and Androderm.  On AndroGel and on 2 pumps a day his testosterone level in 1/13 was 293. In 2/13 the level had improved to 588 with 3 pumps daily.  With 2 pumps of Axiron since 4/13 his level was 389. Also with using 2 pumps twice a day his level was relatively low at 3.03 and his regimen was changed again.  On 4-6 applications of Fortesta daily had variable results and has not been in the normal range His last testosterone level was 1192 using 6 pumps a day; with using 4 pumps the level was 118 with a different assay.   When he had lower levels with 4 pumps a day he was switched to Androderm 4 mg With this however he had some loss of energy and decreased libido and was not exercising, testosterone level was relatively low at 384 He was recommended clomiphene because he was interested in getting his wife pregnant  He did not start the clomiphene because of the cost and also the urologist felt that he would not be able to be successful with the in vitro fertilization He has not taken any Androderm for the last 10 days and is starting to feel significantly fatigued He did not request a new prescription because he is interested in the pellets for supplementation   Wt Readings  from Last 3 Encounters:  01/28/13 222 lb 11.2 oz (101.016 kg)  12/04/12 221 lb (100.245 kg)  10/05/12 222 lb 3.2 oz (100.789 kg)     No visits with results within 1 Week(s) from this visit. Latest known visit with results is:  Appointment on 11/26/2012  Component Date Value Range Status  . Testosterone 11/26/2012 384  300 - 890 ng/dL Final   Comment:           Tanner Stage       Male              Male                                        I              < 30 ng/dL        < 10 ng/dL                                        II             < 150 ng/dL       < 30 ng/dL  III            100-320 ng/dL     < 35 ng/dL                                        IV             200-970 ng/dL     16-10 ng/dL                                        V/Adult        300-890 ng/dL     96-04 ng/dL                             . Sex Hormone Binding 11/26/2012 31  13 - 71 nmol/L Final  . Testosterone, Free 11/26/2012 80.5  47.0 - 244.0 pg/mL Final   Comment:                            The concentration of free testosterone is derived from a mathematical                          expression based on constants for the binding of testosterone to sex                          hormone-binding globulin and albumin.  . Testosterone-% Freee. 11/26/2012 2.1  1.6 - 2.9 % Final      Medication List       This list is accurate as of: 01/28/13  3:25 PM.  Always use your most recent med list.               amphetamine-dextroamphetamine 15 MG tablet  Commonly known as:  ADDERALL     clomiPHENE 50 MG tablet  Commonly known as:  CLOMID  Take 1 tablet (50 mg total) by mouth daily.     clonazePAM 0.5 MG tablet  Commonly known as:  KLONOPIN     cyclobenzaprine 10 MG tablet  Commonly known as:  FLEXERIL     diclofenac 75 MG EC tablet  Commonly known as:  VOLTAREN     divalproex 500 MG 24 hr tablet  Commonly known as:  DEPAKOTE ER  Take 1,500 mg by mouth at bedtime.      fish oil-omega-3 fatty acids 1000 MG capsule  Take 2 g by mouth daily.     lamoTRIgine 100 MG tablet  Commonly known as:  LAMICTAL  Take 100 mg by mouth 2 (two) times daily.     testosterone 4 MG/24HR Pt24 patch  Commonly known as:  ANDRODERM  Place 1 patch onto the skin daily.        Allergies: No Known Allergies  No past medical history on file.  Past Surgical History  Procedure Laterality Date  . Hand surgery      right surgery  . Microdiscectomy lumbar      L4-5    No family history on file.  Social History:  reports that he has been smoking Cigars.  He does not have any smokeless tobacco history on file. He reports that he does  not drink alcohol. His drug history is not on file.  Review of Systems -   No history of diabetes or glucose intolerance No history of hypertension, blood pressure high normal    Assessment/Plan:   Since he has has had relatively low levels on Androderm 4 mg patch and did not  feel well subjectively day may be a candidate for 6 mg However since he is interested in the testosterone pellet Therapy will look into this for sending the prescription  We'll need to monitor his levels with either treatment Currently not exercising and he will need to do this once he is subjectively feeling better   [facility]

## 2013-01-28 NOTE — Telephone Encounter (Signed)
Please find out from [facility], Dr.Nesi group if they are doing any pellet injections for testosterone, if they are we will need a referral done otherwise he will need prescription for Androderm, 4 mg and 2 mg patches together

## 2013-01-29 NOTE — Patient Instructions (Signed)
Treatment To be decided

## 2013-02-01 ENCOUNTER — Other Ambulatory Visit: Payer: Self-pay | Admitting: *Deleted

## 2013-02-01 MED ORDER — TESTOSTERONE 2 MG/24HR TD PT24: MEDICATED_PATCH | TRANSDERMAL | Status: DC

## 2013-02-01 MED ORDER — TESTOSTERONE 4 MG/24HR TD PT24: 1.0000 | MEDICATED_PATCH | Freq: Every day | TRANSDERMAL | Status: DC

## 2013-02-04 ENCOUNTER — Telehealth: Payer: Self-pay | Admitting: Endocrinology

## 2013-02-04 NOTE — Telephone Encounter (Signed)
Pharmacy called and would like to know the medication Dr.Kumar prescribed only comes in 60 qty not 30 qty They would like to know if pt could have full quantity? Call back [phone]  Thank you :)

## 2013-03-11 ENCOUNTER — Other Ambulatory Visit (INDEPENDENT_AMBULATORY_CARE_PROVIDER_SITE_OTHER): Payer: BC Managed Care – PPO

## 2013-03-11 DIAGNOSIS — E230 Hypopituitarism: Secondary | ICD-10-CM

## 2013-03-11 DIAGNOSIS — E291 Testicular hypofunction: Secondary | ICD-10-CM

## 2013-03-11 LAB — TESTOSTERONE: Testosterone: 223.19 ng/dL — ABNORMAL LOW (ref 350.00–890.00)

## 2013-03-13 ENCOUNTER — Other Ambulatory Visit: Payer: Self-pay | Admitting: *Deleted

## 2013-03-13 MED ORDER — TESTOSTERONE 20.25 MG/1.25GM (1.62%) TD GEL: TRANSDERMAL | Status: DC

## 2013-03-28 ENCOUNTER — Other Ambulatory Visit (HOSPITAL_COMMUNITY): Payer: Self-pay | Admitting: Orthopaedic Surgery

## 2013-03-29 ENCOUNTER — Encounter (HOSPITAL_COMMUNITY)
Admission: RE | Admit: 2013-03-29 | Discharge: 2013-03-29 | Disposition: A | Discharge status: Home / Self Care | Payer: BC Managed Care – PPO | Source: Ambulatory Visit | Attending: Orthopaedic Surgery | Admitting: Orthopaedic Surgery

## 2013-03-29 ENCOUNTER — Encounter (HOSPITAL_COMMUNITY): Payer: Self-pay

## 2013-03-29 HISTORY — DX: Depression, unspecified: F32.A

## 2013-03-29 HISTORY — DX: Mental disorder, not otherwise specified: F99

## 2013-03-29 HISTORY — DX: Anxiety disorder, unspecified: F41.9

## 2013-03-29 HISTORY — DX: Unspecified osteoarthritis, unspecified site: M19.90

## 2013-03-29 HISTORY — DX: Other specified postprocedural states: Z98.890

## 2013-03-29 HISTORY — DX: Attention-deficit hyperactivity disorder, unspecified type: F90.9

## 2013-03-29 HISTORY — DX: Unspecified injury of head, initial encounter: S09.90XA

## 2013-03-29 HISTORY — DX: Constipation, unspecified: K59.00

## 2013-03-29 HISTORY — DX: Other complications of anesthesia, initial encounter: T88.59XA

## 2013-03-29 HISTORY — DX: Nausea with vomiting, unspecified: R11.2

## 2013-03-29 HISTORY — DX: Endocrine disorder, unspecified: E34.9

## 2013-03-29 HISTORY — DX: Adverse effect of unspecified anesthetic, initial encounter: T41.45XA

## 2013-03-29 HISTORY — DX: Major depressive disorder, single episode, unspecified: F32.9

## 2013-03-29 LAB — COMPREHENSIVE METABOLIC PANEL
ALBUMIN: 4.1 g/dL (ref 3.5–5.2)
ALT: 38 U/L (ref 0–53)
AST: 22 U/L (ref 0–37)
Alkaline Phosphatase: 33 U/L — ABNORMAL LOW (ref 39–117)
BUN: 16 mg/dL (ref 6–23)
CO2: 25 meq/L (ref 19–32)
CREATININE: 1 mg/dL (ref 0.50–1.35)
Calcium: 9.2 mg/dL (ref 8.4–10.5)
Chloride: 98 mEq/L (ref 96–112)
GFR calc Af Amer: >90 mL/min (ref >90)
GFR, EST NON AFRICAN AMERICAN: 90 mL/min — AB (ref >90)
Glucose, Bld: 82 mg/dL (ref 70–99)
Potassium: 4.5 mEq/L (ref 3.7–5.3)
SODIUM: 137 meq/L (ref 137–147)
Total Bilirubin: 0.4 mg/dL (ref 0.3–1.2)
Total Protein: 7 g/dL (ref 6.0–8.3)

## 2013-03-29 LAB — SURGICAL PCR SCREEN
MRSA, PCR: NEGATIVE
STAPHYLOCOCCUS AUREUS: NEGATIVE

## 2013-03-29 LAB — CBC
HCT: 41.6 % (ref 39.0–52.0)
Hemoglobin: 15.2 g/dL (ref 13.0–17.0)
MCH: 32.7 pg (ref 26.0–34.0)
MCHC: 36.5 g/dL — ABNORMAL HIGH (ref 30.0–36.0)
MCV: 89.5 fL (ref 78.0–100.0)
PLATELETS: 224 10*3/uL (ref 150–400)
RBC: 4.65 MIL/uL (ref 4.22–5.81)
RDW: 12.3 % (ref 11.5–15.5)
WBC: 6.4 10*3/uL (ref 4.0–10.5)

## 2013-03-29 LAB — PROTIME-INR
INR: 1.05 (ref 0.00–1.49)
Prothrombin Time: 13.5 seconds (ref 11.6–15.2)

## 2013-03-29 NOTE — Pre-Procedure Instructions (Addendum)
Shawn Cervantes  03/29/2013   Your procedure is scheduled on: Monday, January 19.  Report to [facility], Main Entrance Jul[facility] Rainier/Entrance "A" at 11:30AM.  Call this number if you have problems the morning of surgery: [phone]   Remember:   Do not eat food or drink liquids after midnight.   Take these the morning of surgery with A SIP OF WATER: clonazePAM (KLONOPIN), lamoTRIgine (LAMICTAL), oxyCODONE-acetaminophen (PERCOCET/ROXICET), Oxycodone-Acetaminophen ER (XARTEMIS XR). Stop taking: diclofenac (VOLTAREN) ,fish oil-omega-3 faty acid.    Do not wear jewelry, make-up or nail polish.  Do not wear lotions, powders, or perfumes. You may wear deodorant.  Do not shave 48 hours prior to surgery. Men may shave face and neck.  Do not bring valuables to the hospital.  [facility] is not responsible for any belongings or valuables.               Contacts, dentures or bridgework may not be worn into surgery.  Leave suitcase in the car. After surgery it may be brought to your room.  For patients admitted to the hospital, discharge time is determined by your treatment team.               Patients discharged the day of surgery will not be allowed to drive home.  Name and phone number of your driver: -   Special Instructions: Shower using CHG 2 nights before surgery and the night before surgery.  If you shower the day of surgery use CHG.  Use special wash - you have one bottle of CHG for all showers.  You should use approximately 1/3 of the bottle for each shower.   Please read over the following fact sheets that you were given: Pain Booklet, Coughing and Deep Breathing and Surgical Site Infection Prevention

## 2013-03-31 MED ORDER — CEFAZOLIN SODIUM-DEXTROSE 2-3 GM-% IV SOLR
2.0000 g | INTRAVENOUS | Status: AC
  Administered 2013-04-01: 2 g via INTRAVENOUS
  Filled 2013-03-31: qty 50

## 2013-04-01 ENCOUNTER — Ambulatory Visit (HOSPITAL_COMMUNITY): Payer: BC Managed Care – PPO | Admitting: Anesthesiology

## 2013-04-01 ENCOUNTER — Observation Stay (HOSPITAL_COMMUNITY)
Admission: RE | Admit: 2013-04-01 | Discharge: 2013-04-02 | Disposition: A | Discharge status: Home / Self Care | Payer: BC Managed Care – PPO | Source: Ambulatory Visit | Attending: Orthopaedic Surgery | Admitting: Orthopaedic Surgery

## 2013-04-01 ENCOUNTER — Ambulatory Visit (HOSPITAL_COMMUNITY): Payer: BC Managed Care – PPO

## 2013-04-01 ENCOUNTER — Encounter (HOSPITAL_COMMUNITY)
Admission: RE | Disposition: A | Discharge status: Home / Self Care | Payer: Self-pay | Source: Ambulatory Visit | Attending: Orthopaedic Surgery

## 2013-04-01 ENCOUNTER — Encounter (HOSPITAL_COMMUNITY): Payer: Self-pay | Admitting: Anesthesiology

## 2013-04-01 ENCOUNTER — Encounter (HOSPITAL_COMMUNITY): Payer: BC Managed Care – PPO | Admitting: Anesthesiology

## 2013-04-01 DIAGNOSIS — I519 Heart disease, unspecified: Secondary | ICD-10-CM | POA: Insufficient documentation

## 2013-04-01 DIAGNOSIS — I10 Essential (primary) hypertension: Secondary | ICD-10-CM | POA: Insufficient documentation

## 2013-04-01 DIAGNOSIS — M216X9 Other acquired deformities of unspecified foot: Secondary | ICD-10-CM | POA: Insufficient documentation

## 2013-04-01 DIAGNOSIS — M5126 Other intervertebral disc displacement, lumbar region: Principal | ICD-10-CM | POA: Insufficient documentation

## 2013-04-01 DIAGNOSIS — E119 Type 2 diabetes mellitus without complications: Secondary | ICD-10-CM | POA: Insufficient documentation

## 2013-04-01 HISTORY — PX: LUMBAR LAMINECTOMY: SHX95

## 2013-04-01 LAB — POCT I-STAT 4, (NA,K, GLUC, HGB,HCT)
Glucose, Bld: 114 mg/dL — ABNORMAL HIGH (ref 70–99)
HCT: 32 % — ABNORMAL LOW (ref 39.0–52.0)
HEMOGLOBIN: 10.9 g/dL — AB (ref 13.0–17.0)
POTASSIUM: 4.3 meq/L (ref 3.7–5.3)
SODIUM: 140 meq/L (ref 137–147)

## 2013-04-01 LAB — PREPARE RBC (CROSSMATCH)

## 2013-04-01 SURGERY — MICRODISCECTOMY LUMBAR LAMINECTOMY
Anesthesia: General | Site: Back | Laterality: Right

## 2013-04-01 MED ORDER — SODIUM CHLORIDE 0.9 % IJ SOLN
3.0000 mL | Freq: Two times a day (BID) | INTRAMUSCULAR | Status: DC
  Administered 2013-04-02: 3 mL via INTRAVENOUS

## 2013-04-01 MED ORDER — BUPIVACAINE HCL (PF) 0.25 % IJ SOLN
INTRAMUSCULAR | Status: DC | PRN
  Administered 2013-04-01: 10 mL

## 2013-04-01 MED ORDER — FENTANYL CITRATE 0.05 MG/ML IJ SOLN
INTRAMUSCULAR | Status: DC | PRN
  Administered 2013-04-01: 100 ug via INTRAVENOUS
  Administered 2013-04-01 (×4): 50 ug via INTRAVENOUS

## 2013-04-01 MED ORDER — ZOLPIDEM TARTRATE 5 MG PO TABS
10.0000 mg | ORAL_TABLET | Freq: Every evening | ORAL | Status: DC | PRN
  Administered 2013-04-02: 10 mg via ORAL
  Filled 2013-04-01: qty 2

## 2013-04-01 MED ORDER — PANTOPRAZOLE SODIUM 40 MG IV SOLR
40.0000 mg | Freq: Every day | INTRAVENOUS | Status: DC
  Administered 2013-04-01: 40 mg via INTRAVENOUS
  Filled 2013-04-01 (×2): qty 40

## 2013-04-01 MED ORDER — OXYCODONE HCL 5 MG PO TABS: 5.0000 mg | ORAL_TABLET | Freq: Once | ORAL | Status: DC | PRN

## 2013-04-01 MED ORDER — ONDANSETRON HCL 4 MG/2ML IJ SOLN: 4.0000 mg | INTRAMUSCULAR | Status: DC | PRN

## 2013-04-01 MED ORDER — METHOCARBAMOL 500 MG PO TABS
500.0000 mg | ORAL_TABLET | Freq: Four times a day (QID) | ORAL | Status: DC | PRN
  Administered 2013-04-01 – 2013-04-02 (×2): 500 mg via ORAL
  Filled 2013-04-01 (×2): qty 1

## 2013-04-01 MED ORDER — METHOCARBAMOL 100 MG/ML IJ SOLN
500.0000 mg | Freq: Four times a day (QID) | INTRAVENOUS | Status: DC | PRN
  Filled 2013-04-01: qty 5

## 2013-04-01 MED ORDER — NEOSTIGMINE METHYLSULFATE 1 MG/ML IJ SOLN
INTRAMUSCULAR | Status: DC | PRN
  Administered 2013-04-01: 1 mg via INTRAVENOUS

## 2013-04-01 MED ORDER — FLEET ENEMA 7-19 GM/118ML RE ENEM: 1.0000 | ENEMA | Freq: Once | RECTAL | Status: AC | PRN

## 2013-04-01 MED ORDER — THROMBIN 20000 UNITS EX SOLR
CUTANEOUS | Status: DC | PRN
  Administered 2013-04-01: 15:00:00 via TOPICAL

## 2013-04-01 MED ORDER — HYDROMORPHONE HCL PF 1 MG/ML IJ SOLN
INTRAMUSCULAR | Status: AC
  Administered 2013-04-01: 0.5 mg via INTRAVENOUS
  Filled 2013-04-01: qty 1

## 2013-04-01 MED ORDER — HEMOSTATIC AGENTS (NO CHARGE) OPTIME
TOPICAL | Status: DC | PRN
  Administered 2013-04-01: 1 via TOPICAL

## 2013-04-01 MED ORDER — KCL IN DEXTROSE-NACL 20-5-0.45 MEQ/L-%-% IV SOLN
INTRAVENOUS | Status: DC
  Filled 2013-04-01 (×4): qty 1000

## 2013-04-01 MED ORDER — ACETAMINOPHEN 325 MG PO TABS: 650.0000 mg | ORAL_TABLET | ORAL | Status: DC | PRN

## 2013-04-01 MED ORDER — MENTHOL 3 MG MT LOZG: 1.0000 | LOZENGE | OROMUCOSAL | Status: DC | PRN

## 2013-04-01 MED ORDER — KETOROLAC TROMETHAMINE 30 MG/ML IJ SOLN
30.0000 mg | Freq: Four times a day (QID) | INTRAMUSCULAR | Status: AC
  Administered 2013-04-01 – 2013-04-02 (×4): 30 mg via INTRAVENOUS
  Filled 2013-04-01 (×5): qty 1

## 2013-04-01 MED ORDER — LAMOTRIGINE 100 MG PO TABS
100.0000 mg | ORAL_TABLET | Freq: Two times a day (BID) | ORAL | Status: DC
  Administered 2013-04-01 – 2013-04-02 (×2): 100 mg via ORAL
  Filled 2013-04-01 (×3): qty 1

## 2013-04-01 MED ORDER — LACTATED RINGERS IV SOLN
INTRAVENOUS | Status: DC
  Administered 2013-04-01: 12:00:00 via INTRAVENOUS

## 2013-04-01 MED ORDER — GLYCOPYRROLATE 0.2 MG/ML IJ SOLN
INTRAMUSCULAR | Status: DC | PRN
  Administered 2013-04-01: 0.2 mg via INTRAVENOUS

## 2013-04-01 MED ORDER — AMPHETAMINE-DEXTROAMPHETAMINE 10 MG PO TABS: 15.0000 mg | ORAL_TABLET | Freq: Every day | ORAL | Status: DC

## 2013-04-01 MED ORDER — PHENYLEPHRINE HCL 10 MG/ML IJ SOLN
INTRAMUSCULAR | Status: DC | PRN
  Administered 2013-04-01 (×6): 80 ug via INTRAVENOUS

## 2013-04-01 MED ORDER — HYDROMORPHONE HCL PF 1 MG/ML IJ SOLN
0.2500 mg | INTRAMUSCULAR | Status: DC | PRN
  Administered 2013-04-01 (×2): 0.5 mg via INTRAVENOUS

## 2013-04-01 MED ORDER — DOCUSATE SODIUM 100 MG PO CAPS
100.0000 mg | ORAL_CAPSULE | Freq: Two times a day (BID) | ORAL | Status: DC
  Administered 2013-04-01 – 2013-04-02 (×2): 100 mg via ORAL
  Filled 2013-04-01 (×2): qty 1

## 2013-04-01 MED ORDER — DIVALPROEX SODIUM ER 500 MG PO TB24
1500.0000 mg | ORAL_TABLET | Freq: Every day | ORAL | Status: DC
  Administered 2013-04-01: 1500 mg via ORAL
  Filled 2013-04-01 (×2): qty 3

## 2013-04-01 MED ORDER — HYDROCODONE-ACETAMINOPHEN 5-325 MG PO TABS
1.0000 | ORAL_TABLET | ORAL | Status: DC | PRN
  Administered 2013-04-01: 2 via ORAL
  Filled 2013-04-01: qty 2

## 2013-04-01 MED ORDER — THROMBIN 20000 UNITS EX SOLR
CUTANEOUS | Status: AC
  Filled 2013-04-01: qty 20000

## 2013-04-01 MED ORDER — OXYCODONE-ACETAMINOPHEN 5-325 MG PO TABS
1.0000 | ORAL_TABLET | ORAL | Status: DC | PRN
  Administered 2013-04-01 – 2013-04-02 (×3): 2 via ORAL
  Filled 2013-04-01 (×4): qty 2

## 2013-04-01 MED ORDER — SENNOSIDES-DOCUSATE SODIUM 8.6-50 MG PO TABS: 1.0000 | ORAL_TABLET | Freq: Every evening | ORAL | Status: DC | PRN

## 2013-04-01 MED ORDER — LACTATED RINGERS IV SOLN
INTRAVENOUS | Status: DC | PRN
  Administered 2013-04-01 (×3): via INTRAVENOUS

## 2013-04-01 MED ORDER — AMPHETAMINE-DEXTROAMPHETAMINE 15 MG PO TABS: 15.0000 mg | ORAL_TABLET | Freq: Every day | ORAL | Status: DC

## 2013-04-01 MED ORDER — SODIUM CHLORIDE 0.9 % IV SOLN: 250.0000 mL | INTRAVENOUS | Status: DC

## 2013-04-01 MED ORDER — CLONAZEPAM 0.5 MG PO TABS
0.2500 mg | ORAL_TABLET | Freq: Two times a day (BID) | ORAL | Status: DC | PRN
Start: 2013-04-01 — End: 2013-04-02

## 2013-04-01 MED ORDER — OXYCODONE-ACETAMINOPHEN 5-325 MG PO TABS: 1.0000 | ORAL_TABLET | ORAL | Status: DC | PRN

## 2013-04-01 MED ORDER — ACETAMINOPHEN 650 MG RE SUPP: 650.0000 mg | RECTAL | Status: DC | PRN

## 2013-04-01 MED ORDER — SODIUM CHLORIDE 0.9 % IJ SOLN: 3.0000 mL | INTRAMUSCULAR | Status: DC | PRN

## 2013-04-01 MED ORDER — LIDOCAINE HCL (CARDIAC) 20 MG/ML IV SOLN
INTRAVENOUS | Status: DC | PRN
  Administered 2013-04-01: 80 mg via INTRAVENOUS

## 2013-04-01 MED ORDER — DEXAMETHASONE SODIUM PHOSPHATE 4 MG/ML IJ SOLN
INTRAMUSCULAR | Status: DC | PRN
  Administered 2013-04-01: 8 mg via INTRAVENOUS

## 2013-04-01 MED ORDER — PROPOFOL 10 MG/ML IV BOLUS
INTRAVENOUS | Status: DC | PRN
  Administered 2013-04-01: 180 mg via INTRAVENOUS

## 2013-04-01 MED ORDER — ALBUMIN HUMAN 5 % IV SOLN
INTRAVENOUS | Status: DC | PRN
  Administered 2013-04-01 (×2): via INTRAVENOUS

## 2013-04-01 MED ORDER — BISACODYL 10 MG RE SUPP: 10.0000 mg | Freq: Every day | RECTAL | Status: DC | PRN

## 2013-04-01 MED ORDER — BUPIVACAINE HCL (PF) 0.25 % IJ SOLN
INTRAMUSCULAR | Status: AC
  Filled 2013-04-01: qty 30

## 2013-04-01 MED ORDER — PHENOL 1.4 % MT LIQD: 1.0000 | OROMUCOSAL | Status: DC | PRN

## 2013-04-01 MED ORDER — ONDANSETRON HCL 4 MG/2ML IJ SOLN
INTRAMUSCULAR | Status: DC | PRN
  Administered 2013-04-01: 4 mg via INTRAVENOUS

## 2013-04-01 MED ORDER — ARTIFICIAL TEARS OP OINT
TOPICAL_OINTMENT | OPHTHALMIC | Status: DC | PRN
  Administered 2013-04-01: 1 via OPHTHALMIC

## 2013-04-01 MED ORDER — MIDAZOLAM HCL 5 MG/5ML IJ SOLN
INTRAMUSCULAR | Status: DC | PRN
  Administered 2013-04-01: 2 mg via INTRAVENOUS

## 2013-04-01 MED ORDER — CEFAZOLIN SODIUM 1-5 GM-% IV SOLN
1.0000 g | Freq: Three times a day (TID) | INTRAVENOUS | Status: AC
  Administered 2013-04-01 – 2013-04-02 (×2): 1 g via INTRAVENOUS
  Filled 2013-04-01 (×2): qty 50

## 2013-04-01 MED ORDER — HYDROMORPHONE HCL PF 1 MG/ML IJ SOLN
0.5000 mg | INTRAMUSCULAR | Status: DC | PRN
  Administered 2013-04-02: 1 mg via INTRAVENOUS
  Filled 2013-04-01: qty 1

## 2013-04-01 MED ORDER — ROCURONIUM BROMIDE 100 MG/10ML IV SOLN
INTRAVENOUS | Status: DC | PRN
  Administered 2013-04-01: 50 mg via INTRAVENOUS

## 2013-04-01 MED ORDER — OXYCODONE HCL 5 MG/5ML PO SOLN: 5.0000 mg | Freq: Once | ORAL | Status: DC | PRN

## 2013-04-01 MED ORDER — SODIUM CHLORIDE 0.9 % IR SOLN
Status: DC | PRN
  Administered 2013-04-01: 1000 mL

## 2013-04-01 MED ORDER — PROMETHAZINE HCL 25 MG/ML IJ SOLN: 6.2500 mg | INTRAMUSCULAR | Status: DC | PRN

## 2013-04-01 SURGICAL SUPPLY — 52 items
ADH SKN CLS APL DERMABOND .7 (GAUZE/BANDAGES/DRESSINGS) ×1
BUR ROUND FLUTED 4 SOFT TCH (BURR) IMPLANT
CORDS BIPOLAR (ELECTRODE) ×2 IMPLANT
COVER SURGICAL LIGHT HANDLE (MISCELLANEOUS) ×2 IMPLANT
DERMABOND ADVANCED (GAUZE/BANDAGES/DRESSINGS) ×1
DERMABOND ADVANCED .7 DNX12 (GAUZE/BANDAGES/DRESSINGS) ×1 IMPLANT
DRAPE MICROSCOPE LEICA (MISCELLANEOUS) ×2 IMPLANT
DRAPE PROXIMA HALF (DRAPES) ×4 IMPLANT
DRSG EMULSION OIL 3X3 NADH (GAUZE/BANDAGES/DRESSINGS) ×2 IMPLANT
DRSG MEPILEX BORDER 4X4 (GAUZE/BANDAGES/DRESSINGS) ×2 IMPLANT
DURAPREP 26ML APPLICATOR (WOUND CARE) ×2 IMPLANT
DURASEAL SPINE SEALANT 3ML (MISCELLANEOUS) ×1 IMPLANT
ELECT REM PT RETURN 9FT ADLT (ELECTROSURGICAL) ×2
ELECTRODE REM PT RTRN 9FT ADLT (ELECTROSURGICAL) ×1 IMPLANT
GLOVE BIOGEL PI IND STRL 7.5 (GLOVE) ×1 IMPLANT
GLOVE BIOGEL PI IND STRL 8 (GLOVE) ×1 IMPLANT
GLOVE BIOGEL PI INDICATOR 7.5 (GLOVE) ×1
GLOVE BIOGEL PI INDICATOR 8 (GLOVE) ×1
GLOVE ECLIPSE 7.0 STRL STRAW (GLOVE) ×2 IMPLANT
GLOVE ORTHO TXT STRL SZ7.5 (GLOVE) ×2 IMPLANT
GOWN PREVENTION PLUS LG XLONG (DISPOSABLE) IMPLANT
GOWN STRL NON-REIN LRG LVL3 (GOWN DISPOSABLE) ×6 IMPLANT
KIT BASIN OR (CUSTOM PROCEDURE TRAY) ×2 IMPLANT
KIT ROOM TURNOVER OR (KITS) ×2 IMPLANT
NDL SPNL 18GX3.5 QUINCKE PK (NEEDLE) ×1 IMPLANT
NDL SUT .5 MAYO 1.404X.05X (NEEDLE) ×1 IMPLANT
NEEDLE 22X1 1/2 (OR ONLY) (NEEDLE) ×2 IMPLANT
NEEDLE MAYO TAPER (NEEDLE) ×2
NEEDLE SPNL 18GX3.5 QUINCKE PK (NEEDLE) ×2 IMPLANT
NS IRRIG 1000ML POUR BTL (IV SOLUTION) ×2 IMPLANT
PACK LAMINECTOMY ORTHO (CUSTOM PROCEDURE TRAY) ×2 IMPLANT
PAD ARMBOARD 7.5X6 YLW CONV (MISCELLANEOUS) ×4 IMPLANT
PATTIES SURGICAL .5 X.5 (GAUZE/BANDAGES/DRESSINGS) ×3 IMPLANT
PATTIES SURGICAL .75X.75 (GAUZE/BANDAGES/DRESSINGS) IMPLANT
SPONGE GAUZE 4X4 12PLY (GAUZE/BANDAGES/DRESSINGS) ×2 IMPLANT
SUT NURALON 4 0 TR CR/8 (SUTURE) ×1 IMPLANT
SUT PROLENE 6 0 BV (SUTURE) ×1 IMPLANT
SUT PROLENE 6 0 C 1 24 (SUTURE) ×1 IMPLANT
SUT PROLENE 6 0 CC (SUTURE) ×1 IMPLANT
SUT PROLENE 6 0 PC 1 (SUTURE) ×1 IMPLANT
SUT VIC AB 0 CT1 27 (SUTURE) ×4
SUT VIC AB 0 CT1 27XBRD ANBCTR (SUTURE) IMPLANT
SUT VIC AB 2-0 CT1 27 (SUTURE) ×2
SUT VIC AB 2-0 CT1 TAPERPNT 27 (SUTURE) ×1 IMPLANT
SUT VICRYL 0 TIES 12 18 (SUTURE) ×2 IMPLANT
SUT VICRYL 4-0 PS2 18IN ABS (SUTURE) IMPLANT
SUT VICRYL AB 2 0 TIES (SUTURE) ×2 IMPLANT
SYR 20ML ECCENTRIC (SYRINGE) IMPLANT
SYR CONTROL 10ML LL (SYRINGE) ×3 IMPLANT
TOWEL OR 17X24 6PK STRL BLUE (TOWEL DISPOSABLE) ×2 IMPLANT
TOWEL OR 17X26 10 PK STRL BLUE (TOWEL DISPOSABLE) ×2 IMPLANT
WATER STERILE IRR 1000ML POUR (IV SOLUTION) ×2 IMPLANT

## 2013-04-01 NOTE — Brief Op Note (Signed)
04/01/2013  5:02 PM  PATIENT:  Shawn Cervantes  43 y.o. 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  PROCEDURE:  Procedure(s) with comments: MICRODISCECTOMY LUMBAR LAMINECTOMY (Right) - 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 Dural tear and repair  SURGEON:  Surgeon(s) and Role:    * Eldred MangesMark C Yates, MD - Primary  PHYSICIAN ASSISTANT: Maurie BoettcherSheila VernonPAC  ASSISTANTS: none   ANESTHESIA:   general  EBL:  Total I/O In: 2500 [I.V.:2000; IV Piggyback:500] Out: 1000 [Blood:1000]  BLOOD ADMINISTERED:none  DRAINS: none   LOCAL MEDICATIONS USED:  MARCAINE     SPECIMEN:  No Specimen  DISPOSITION OF SPECIMEN:  N/A  COUNTS:  YES  TOURNIQUET:  * No tourniquets in log *  DICTATION: .Note written in EPIC  PLAN OF CARE: Admit for overnight observation  PATIENT DISPOSITION:  PACU - hemodynamically stable.   Delay start of Pharmacological VTE agent (>24hrs) due to surgical blood loss or risk of bleeding: yes

## 2013-04-01 NOTE — Preoperative (Signed)
Beta Blockers   Reason not to administer Beta Blockers:Not Applicable 

## 2013-04-01 NOTE — Interval H&P Note (Signed)
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 After consideration of risks, benefits and other options for treatment, the patient has consented to  Procedure(s) with comments: MICRODISCECTOMY LUMBAR LAMINECTOMY (Right) - 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 as a surgical intervention .  The patient's history has been reviewed, patient examined, no change in status, stable for surgery.  I have reviewed the patient's chart and labs.  Questions were answered to the patient's satisfaction.     [name] C

## 2013-04-01 NOTE — Interval H&P Note (Signed)
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 After consideration of risks, benefits and other options for treatment, the patient has consented to  Procedure(s) with comments: MICRODISCECTOMY LUMBAR LAMINECTOMY (Right) - 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 as a surgical intervention .  The patient's history has been reviewed, patient examined, no change in status, stable for surgery.  I have reviewed the patient's chart and labs.  Questions were answered to the patient's satisfaction.     Marino Rogerson C   

## 2013-04-01 NOTE — Anesthesia Preprocedure Evaluation (Addendum)
Anesthesia Evaluation  Patient identified by MRN, date of birth, ID band Patient awake    Reviewed: Allergy & Precautions, H&P , NPO status , Patient's Chart, lab work & pertinent test results  History of Anesthesia Complications (+) PONV  Airway Mallampati: II TM Distance: >3 FB Neck ROM: Full    Dental   Pulmonary COPDCurrent Smoker,  + rhonchi         Cardiovascular Rhythm:Regular Rate:Normal     Neuro/Psych Anxiety Depression ADHDH/o head injury    GI/Hepatic   Endo/Other    Renal/GU      Musculoskeletal   Abdominal (+) + obese,   Peds  Hematology   Anesthesia Other Findings   Reproductive/Obstetrics                          Anesthesia Physical Anesthesia Plan  ASA: III  Anesthesia Plan: General   Post-op Pain Management:    Induction: Intravenous  Airway Management Planned: Oral ETT  Additional Equipment:   Intra-op Plan:   Post-operative Plan: Extubation in OR  Informed Consent: I have reviewed the patients History and Physical, chart, labs and discussed the procedure including the risks, benefits and alternatives for the proposed anesthesia with the patient or authorized representative who has indicated his/her understanding and acceptance.     Plan Discussed with: CRNA and Surgeon  Anesthesia Plan Comments:         Anesthesia Quick Evaluation

## 2013-04-01 NOTE — Transfer of Care (Signed)
Immediate Anesthesia Transfer of Care Note  Patient: Shawn Cervantes  Procedure(s) Performed: Procedure(s) with comments: MICRODISCECTOMY LUMBAR LAMINECTOMY (Right) - 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  Patient Location: PACU  Anesthesia Type:General  Level of Consciousness: awake, alert  and oriented  Airway & Oxygen Therapy: Patient Spontanous Breathing and Patient connected to nasal cannula oxygen  Post-op Assessment: Report given to PACU RN, Post -op Vital signs reviewed and stable and Patient moving all extremities X 4  Post vital signs: Reviewed and stable  Complications: No apparent anesthesia complications

## 2013-04-01 NOTE — OR Nursing (Signed)
1700: about three 1" long marks visible post-op on left lateral ankle, two faint, one more pronounced apparently from pressure from pas hose tubing/connector or something.  Already looking better before living OR.  Pointed out to surgeon in PACU, where it looked better yet, and surgeon states monitor for resolution.

## 2013-04-01 NOTE — H&P (Signed)
[facility]   A Division of [facility]   [address], [place], [place] [address] Telephone: [phone]  Fax: [phone]     PATIENT: Shawn Cervantes, Shawn Cervantes   MR#: [id number]  DOB: 08/11/1970       This 43 year old male states he is miserable with severe back pain, right leg pain, and right leg weakness and has partial foot drop.  Dr. Margretta DittyNeustadt had ordered an MRI scan.  He has been treated with a Medrol Dosepak, intramuscular cortisone injections, chiropractic treatment greater than a month with persistent symptoms.  He was seen in December.  He had an episode when he was putting up a folding table.  He suddenly had sharp onset of back pain and leg pain without relief.  Previous right L4-5 microdiscectomy in December 2011.  He has had several years of complete pain relief and resumption of normal activities.  The patient has taken Norco, prednisone pack, hydrocodone, and Tylenol No. 3 without relief.  He requested a stronger pain medication and Percocet was prescribed.  He is normally followed by Dr. Tally Joeavid Swayne at [facility].   MEDICATIONS:  Include Depakote 1500 mg daily, Lamictal 200 mg daily, Klonopin 0.25 mg p.r.n., Fortesta 4 pumps daily, Flexeril 10 mg p.r.n., Ambien 10 mg at night, and Adderall 15 mg daily.   PAST SURGICAL HISTORY:  Include right thumb tendon repair x2, wrist reconstruction in 1998, wrist fusion in 1999, L4-5 microdiscectomy in December 2011.   FAMILY HISTORY:  Positive for diabetes, heart disease and hypertension.   SOCIAL HISTORY:  Married to his wife, [name].  Works as an Scientist, product/process developmentT professional.  Does not smoke cigarettes.  Occasionally puffs a cigar but does not inhale.  He does drink socially.   REVIEW OF SYSTEMS:  Positive for history of alcohol use, excessive at one point in the past, shoulder bursitis, previous hand surgeries, migraines and depression.   PHYSICAL EXAMINATION:  The patient is 6 feet 2 inches.  Weight 220  pounds.  Alert and oriented.  WD, WN, NAD.   Extraocular movements are intact.  No brachial plexus tenderness.  He has anterior tib weakness on the right side, 1/2-grade.  EHL is 1/2-grade weak.  Gastrocsoleus is normal.  Sensory testing is normal.   RADIOGRAPHS:  MRI scan reviewed.  This shows a large disc herniation with significant compression at L4-5, which is paracentral.   PLAN:  Microdiscectomy, possible bilateral if the disc cannot be adequately decompressed from the right side.  Risks of surgery discussed including bleeding and infection.  Discussed overnight stay, the use of operative microscope, the possibility of dural tear, spinal fluid leakage, dural repair, re-operation, the possibility of recurrent disc herniation at some point in the future and if so, then fusion would be considered.  He has some dehydration of other discs and at this point I discussed with him that since his original surgery he had done well, that microdiscectomy would be indicated.  All questions answered and he understands.  Requests to proceed.   For additional information please see handwritten notes, reports, orders and prescriptions in this chart.      Mark C. Ophelia CharterYates, M.D.    Auto-Authenticated by Veverly FellsMark C. Ophelia CharterYates, M.D.

## 2013-04-02 LAB — ABO/RH: ABO/RH(D): O POS

## 2013-04-02 NOTE — Progress Notes (Signed)
Subjective: 1 Day Post-Op Procedure(s) (LRB): MICRODISCECTOMY LUMBAR LAMINECTOMY (Right) Patient reports pain as 4 on 0-10 scale.    Objective: Vital signs in last 24 hours: Temp:  [97.1 F (36.2 C)-97.8 F (36.6 C)] 97.7 F (36.5 C) (01/20 0621) Pulse Rate:  [75-104] 95 (01/20 0621) Resp:  [12-20] 18 (01/20 0621) BP: (109-130)/(61-75) 130/75 mmHg (01/20 0621) SpO2:  [95 %-98 %] 98 % (01/20 0621)  Intake/Output from previous day: 01/19 0701 - 01/20 0700 In: 2600 [I.V.:2100; IV Piggyback:500] Out: 1000 [Blood:1000] Intake/Output this shift:     Recent Labs  04/01/13 1601  HGB 10.9*    Recent Labs  04/01/13 1601  HCT 32.0*    Recent Labs  04/01/13 1601  NA 140  K 4.3  GLUCOSE 114*   No results found for this basename: LABPT, INR,  in the last 72 hours  Neurologically intact  Dressing dry incision dry.  Right and left EHL, AT, GC all 5/5. Sensory intact. Incisional pain.  Assessment/Plan: 1 Day Post-Op Procedure(s) (LRB): MICRODISCECTOMY LUMBAR LAMINECTOMY (Right) Plan    OOB ambulation. Discharge today if safe with mobility.    Yamil Dougher C 04/02/2013, 7:37 AM

## 2013-04-02 NOTE — Progress Notes (Signed)
Patient ID: Shawn Cervantes, male   DOB: 08/23/1970, 43 y.o.   MRN: [id number] Ambulatory. Pain controlled. Rx on chart. Discharge Home office followup one to two weeks.

## 2013-04-02 NOTE — Op Note (Signed)
NAMESUHAIL, PELOQUIN                ACCOUNT NO.:  [id number]  MEDICAL RECORD NO.:  [id number]  LOCATION:                               FACILITY:  [facility]  PHYSICIAN:  Shanty Ginty C. Ophelia Charter, M.D.    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  PROCEDURES:  Right L4-5 microdiskectomy.  Removal of large HNP, free fragment.  SURGEON:  Lissete Maestas C. Ophelia Charter, M.D.  ASSISTANT:  Wende Neighbors, PA-C, medically necessary and present for the entire procedure.  DRAINS:  None.  ANESTHESIA:  General plus Marcaine skin local.  INDICATIONS:  This 43 year old male had previous microdiskectomy by Dr. Ophelia Charter in December 2011, had normal postoperative course, has been back working, not had any trouble in interval for 2-1/2 years when he developed recurrent back, right leg pain, partial foot drop.  An MRI showed large disk herniation with significant compression-free fragment and disk material both cephalad and caudal to the level of the disk.  He had been treated conservatively, pain progressed to the point where he is having difficulty walking and standing, and is brought in for microdiskectomy.  He had been taking Percocet for pain, and through chiropractor treatment of Medrol Dosepak, muscle relaxants, and gradually stronger narcotic pain medication.  DESCRIPTION OF PROCEDURE:  After induction of general anesthesia and orotracheal intubation, the patient was placed prone on chest rolls. Standard prepping and draping was performed with DuraPrep.  Pads were placed underneath the arms, rolled yellow foam crates as well as over the ulnar nerve.  Old incision after the DuraPrep had dried, was marked with sterile skin marker after  squaring the area out with towels and large Betadine Steri-Drape was applied.  The patient was large at 240 pounds.  Old incision was opened, extended 2-3 mm on each end. Subperiosteal dissection on the lamina.  Due to the patient's large size, initially, Penfield 4 was placed, cross-table lateral x-ray showed that this was at the 3-4 level.  L4-5 level had Taylor retractor placed laterally and Penfield 4 placed, confirmed with cross-table lateral radiograph.  Laminotomy was performed.  Initially, using the bur, it was thinned down and laminotomy was performed removing some lateral bone and right L4 laminotomy.  There was some overhanging ligamentum, which was removed.  Dura was dorsally displaced, was tight and disk space was identified.  Operative microscope was draped and was brought in.  Dura would not mobilize well.  Annulus was incised and passes were made with micropituitary up and down.  Gradually, there was partial decompression, then several large chunks of disk were teased out.  Once the portion of the disk had been decompressed, ball-tip and black nerve hook was used to gently tease corners of large pieces of disk, which were removed. Nerve root was free.  Shoulder of the nerve root was free.  Hockey stick was placed anterior to the dura with no areas of compression.  Epstein was used in the disk space, pushing  chunks of disk down toward the middle of the disk and then removed with standard pituitary.  Final pass was made with hockey stick and along the shoulder of the nerve root ventrally.  There was small pieces of bone that was adherent to the dura and as the hockey stick was being removed under direct visualization with the microscope, a dural tear occurred.  This was 3-mm and was adjacent to an epidural vein present directly on the dura that had been bleeding, some patty had been placed.  With dural tear, nerve root buckled out and was poking out through the dural tear.   Nerve root was pushed back in with a Penfield 4, and Nurolon sutures were used to simple sutures for closure.  Due to the angle, repair was difficult, needle had to be bent few times to finally be able to pass sutures through both sides.  Nerve root was reduced using the operative microscope under direct visualization.  The field was dry.  There was no spinal fluid leakage.  Some DuraSeal was mixed and dripped down over the area where the two Nurolon simple sutures were placed.  No remaining disk material was present.  Nerve root was well decompressed both centrally and out on the right side.  Copious irrigation.  Some thrombin and Gelfoam had been placed with patties directly over the epidural vein that was oozing.  Few minutes were given for this to stop.  Standard layered closure of the deep fascia with 0 Vicryl, 2-0 Vicryl subcutaneous tissue, Dermabond for the skin, postop dressing.  The patient was injected with Marcaine.  Tolerated the procedure well and was transferred to the recovery room in stable condition.     Kamdon Reisig C. Ophelia CharterYates, M.D.     MCY/MEDQ  D:  04/01/2013  T:  04/02/2013  Job:  [id number]

## 2013-04-02 NOTE — Anesthesia Postprocedure Evaluation (Signed)
Anesthesia Post Note  Patient: Shawn Cervantes  Procedure(s) Performed: Procedure(s) (LRB): MICRODISCECTOMY LUMBAR LAMINECTOMY (Right)  Anesthesia type: General  Patient location: PACU  Post pain: Pain level controlled and Adequate analgesia  Post assessment: Post-op Vital signs reviewed, Patient's Cardiovascular Status Stable, Respiratory Function Stable, Patent Airway and Pain level controlled  Last Vitals:  Filed Vitals:   04/02/13 0621  BP: 130/75  Pulse: 95  Temp: 36.5 C  Resp: 18    Post vital signs: Reviewed and stable  Level of consciousness: awake, alert  and oriented  Complications: No apparent anesthesia complications

## 2013-04-03 LAB — TYPE AND SCREEN
ABO/RH(D): O POS
Antibody Screen: NEGATIVE
UNIT DIVISION: 0
Unit division: 0

## 2013-04-04 ENCOUNTER — Encounter (HOSPITAL_COMMUNITY): Payer: Self-pay | Admitting: Orthopaedic Surgery

## 2013-04-16 NOTE — Discharge Instructions (Signed)
    No lifting greater than 10 lbs. Avoid bending, stooping and twisting. Walk in house for first week them may start to get out slowly increasing distance up to one mile by 3 weeks post op. Keep incision dry for 3 days, may use tegaderm or similar water impervious dressing.  

## 2013-04-16 NOTE — Discharge Summary (Signed)
Physician Discharge Summary  Patient ID: Junie BameDerrik T Smaltz MRN: [id number] DOB/AGE: 45/11/1970 45 y.o.  Admit date: 04/01/2013 Discharge date: 04/02/2013  Admission Diagnoses:  HNP (herniated nucleus pulposus), lumbar, Recurrent right L4-5  Discharge Diagnoses:  Principal Problem:   HNP (herniated nucleus pulposus), lumbar Recurrent right L4-5  Past Medical History  Diagnosis Date  . Complication of anesthesia   . PONV (postoperative nausea and vomiting)   . Anxiety   . Mental disorder   . Depression     bi polar  . ADHD (attention deficit hyperactivity disorder)   . Head injury   . Constipation   . Arthritis   . Testosterone deficiency     Surgeries: Procedure(s): MICRODISCECTOMY LUMBAR LAMINECTOMY right L4-5 on 04/01/2013   Consultants (if any):  none  Discharged Condition: Improved  Hospital Course: Chrisean Leitha Schuller Mckissack is an 43 y.o. male who was admitted 04/01/2013 with a diagnosis of HNP (herniated nucleus pulposus), lumbar and went to the operating room on 04/01/2013 and underwent the above named procedures.    He was given perioperative antibiotics:  Anti-infectives   Start     Dose/Rate Route Frequency Ordered Stop   04/01/13 2100  ceFAZolin (ANCEF) IVPB 1 g/50 mL premix     1 g 100 mL/hr over 30 Minutes Intravenous Every 8 hours 04/01/13 1827 04/02/13 0604   04/01/13 0600  ceFAZolin (ANCEF) IVPB 2 g/50 mL premix     2 g 100 mL/hr over 30 Minutes Intravenous On call to O.R. 03/31/13 1326 04/01/13 1345    .  He was given sequential compression devices, early ambulation for DVT prophylaxis.  He benefited maximally from the hospital stay and there were no complications.    Recent vital signs:  Filed Vitals:   04/02/13 0621  BP: 130/75  Pulse: 95  Temp: 97.7 F (36.5 C)  Resp: 18    Recent laboratory studies:  Lab Results  Component Value Date   HGB 10.9* 04/01/2013   HGB 15.2 03/29/2013   HGB 15.6 07/22/2011   Lab Results  Component Value Date   WBC 6.4  03/29/2013   PLT 224 03/29/2013   Lab Results  Component Value Date   INR 1.05 03/29/2013   Lab Results  Component Value Date   NA 140 04/01/2013   K 4.3 04/01/2013   CL 98 03/29/2013   CO2 25 03/29/2013   BUN 16 03/29/2013   CREATININE 1.00 03/29/2013   GLUCOSE 114* 04/01/2013    Discharge Medications:     Medication List    STOP taking these medications       XARTEMIS XR 7.5-325 MG Tbcr  Generic drug:  Oxycodone-Acetaminophen ER  Replaced by:  oxyCODONE-acetaminophen 5-325 MG per tablet      TAKE these medications       amphetamine-dextroamphetamine 15 MG tablet  Commonly known as:  ADDERALL  Take 15 mg by mouth daily.     clonazePAM 0.5 MG tablet  Commonly known as:  KLONOPIN  0.25 mg as needed for anxiety.     cyclobenzaprine 10 MG tablet  Commonly known as:  FLEXERIL  Take 10 mg by mouth daily as needed for muscle spasms.     diclofenac 75 MG EC tablet  Commonly known as:  VOLTAREN  Take 75 mg by mouth 3 (three) times daily.     divalproex 500 MG 24 hr tablet  Commonly known as:  DEPAKOTE ER  Take 1,500 mg by mouth at bedtime.     fish oil-omega-3 fatty  acids 1000 MG capsule  Take 2 g by mouth daily.     lamoTRIgine 100 MG tablet  Commonly known as:  LAMICTAL  Take 100 mg by mouth 2 (two) times daily.     oxyCODONE-acetaminophen 5-325 MG per tablet  Commonly known as:  PERCOCET/ROXICET  Take 1-2 tablets by mouth every 6 (six) hours as needed for moderate pain or severe pain.     oxyCODONE-acetaminophen 5-325 MG per tablet  Commonly known as:  ROXICET  Take 1-2 tablets by mouth every 4 (four) hours as needed for moderate pain.     Testosterone 20.25 MG/1.25GM (1.62%) Gel  Commonly known as:  ANDROGEL  Apply 3 pumps on each thigh     zolpidem 10 MG tablet  Commonly known as:  AMBIEN  Take 10 mg by mouth at bedtime as needed for sleep.        Diagnostic Studies: Dg Chest 2 View  03/29/2013   CLINICAL DATA:  Preoperative evaluation for  microdiskectomy, history smoking  EXAM: CHEST  2 VIEW  COMPARISON:  07/23/2011  FINDINGS: Upper-normal size of cardiac silhouette.  Mediastinal contours and pulmonary vascularity normal.  Lungs clear.  No pleural effusion or pneumothorax.  Bones unremarkable.  Increased stool identified within the colon in the upper abdomen.  IMPRESSION: No acute cardiopulmonary abnormalities.  Increased stool within colon visualized in the upper abdomen.   Electronically Signed   By: Ulyses Southward M.D.   On: 03/29/2013 16:43   Dg Lumbar Spine 2-3 Views  04/01/2013   CLINICAL DATA:  HNP  EXAM: LUMBAR SPINE - 2-3 VIEW  COMPARISON:  CT ABD/PELVIS W CM dated 10/15/2010  FINDINGS: Two lateral views. The first, labeled 1417 hr, demonstrates surgical devices projecting posterior to the L3-4 interspace.  The second image, labeled 1424 hr, demonstrates surgical devices projecting posterior to the L4 vertebral body and L4-5 interspace.  IMPRESSION: Intraoperative localization of L4 and the L4-5 interspace.   Electronically Signed   By: Jeronimo Greaves M.D.   On: 04/01/2013 19:08    Disposition: 01-Home or Self Care DISCHARGE INSTRUCTIONS:No lifting greater than 10 lbs. Avoid bending, stooping and twisting. Walk in house for first week them may start to get out slowly increasing distance up to one mile by 3 weeks post op. Keep incision dry for 3 days, may use tegaderm or similar water impervious dressing.        Follow-up Information   Follow up with Eldred Manges, MD. Schedule an appointment as soon as possible for a visit in 2 weeks.   Specialty:  Orthopedic Surgery   Contact information:   [address] [place] [address] [phone]        Signed: Wende Neighbors 04/16/2013, 3:35 PM

## 2013-05-14 ENCOUNTER — Other Ambulatory Visit: Payer: Self-pay | Admitting: *Deleted

## 2013-05-14 MED ORDER — TESTOSTERONE 20.25 MG/1.25GM (1.62%) TD GEL: TRANSDERMAL | Status: DC

## 2013-06-21 ENCOUNTER — Other Ambulatory Visit (INDEPENDENT_AMBULATORY_CARE_PROVIDER_SITE_OTHER): Payer: BC Managed Care – PPO

## 2013-06-21 ENCOUNTER — Other Ambulatory Visit: Payer: Self-pay | Admitting: *Deleted

## 2013-06-21 DIAGNOSIS — E291 Testicular hypofunction: Secondary | ICD-10-CM

## 2013-06-21 DIAGNOSIS — E230 Hypopituitarism: Secondary | ICD-10-CM

## 2013-06-21 LAB — TESTOSTERONE: Testosterone: 221.2 ng/dL — ABNORMAL LOW (ref 350.00–890.00)

## 2013-07-17 ENCOUNTER — Ambulatory Visit (INDEPENDENT_AMBULATORY_CARE_PROVIDER_SITE_OTHER): Payer: BC Managed Care – PPO | Admitting: Endocrinology

## 2013-07-17 ENCOUNTER — Encounter: Payer: Self-pay | Admitting: Endocrinology

## 2013-07-17 VITALS — BP 122/82 | HR 104 | Temp 98.4°F | Resp 12 | Wt 235.0 lb

## 2013-07-17 DIAGNOSIS — E291 Testicular hypofunction: Secondary | ICD-10-CM

## 2013-07-17 DIAGNOSIS — E230 Hypopituitarism: Secondary | ICD-10-CM

## 2013-07-17 NOTE — Progress Notes (Signed)
Patient ID: Shawn Cervantes, male   DOB: 03/23/1970, 43 y.o.   MRN: [id number]   Chief complaint: Followup of HYPOGONADISM   History of Present Illness:    Past history: About 2 years prior to his diagnosis he started noticing increasing tiredness especially in the evening. Also was having decreased exercise capacity, decreased libido and some depression. He was diagnosed to have hypogonadotropic hypogonadism  He did feel significantly better with starting supplementation with AndroGel initially.  The lab findings have included PRE-TREATMENT testosterone level of 228 total and 6.3 free ( normal >6.8) on 08/27/10.  LH, prolactin in 9/13 were normal. .   Medications that the patient has used include fortesta, Axiron, Androgel and Androderm.  On AndroGel and on 2 pumps a day his testosterone level in 1/13 was 293. In 2/13 the level had improved to 588 with 3 pumps daily.  With 2 pumps of Axiron since 4/13 his level was 389. Also with using 2 pumps twice a day his level was relatively low at 3.03 and his regimen was changed again.  On 4-6 applications of Fortesta daily had variable results and has not been in the normal range  When he had lower levels with 4 pumps a day he was switched to Androderm 4 mg With this however he had some loss of energy and decreased libido and testosterone level was low both with a 4 mg and 6 mg doses He is now on AndroGel again at high dose of 3 pumps on each thigh and his testosterone level is even lower. He thinks he is taking this quite regularly but he has difficulty applying this as it takes a long time to dry and sometimes leaves a dry residue that flakes off He is having relatively low energy level again, about 50% of normal  Wt Readings from Last 3 Encounters:  07/17/13 235 lb (106.595 kg)  03/29/13 239 lb 3.2 oz (108.5 kg)  01/28/13 222 lb 11.2 oz (101.016 kg)     No visits with results within 1 Week(s) from this visit. Latest known visit with results  is:  Appointment on 06/21/2013  Component Date Value Ref Range Status  . Testosterone 06/21/2013 221.20* 350.00 - 890.00 ng/dL Final      Medication List       This list is accurate as of: 07/17/13  4:26 PM.  Always use your most recent med list.               amphetamine-dextroamphetamine 15 MG tablet  Commonly known as:  ADDERALL  Take 15 mg by mouth daily.     clonazePAM 0.5 MG tablet  Commonly known as:  KLONOPIN  0.25 mg as needed for anxiety.     cyclobenzaprine 10 MG tablet  Commonly known as:  FLEXERIL  Take 10 mg by mouth daily as needed for muscle spasms.     diclofenac 75 MG EC tablet  Commonly known as:  VOLTAREN  Take 75 mg by mouth 3 (three) times daily.     divalproex 500 MG 24 hr tablet  Commonly known as:  DEPAKOTE ER  Take 1,500 mg by mouth at bedtime.     fish oil-omega-3 fatty acids 1000 MG capsule  Take 2 g by mouth daily.     HYDROcodone-acetaminophen 5-325 MG per tablet  Commonly known as:  NORCO/VICODIN     lamoTRIgine 100 MG tablet  Commonly known as:  LAMICTAL  Take 100 mg by mouth 2 (two) times daily.  oxyCODONE-acetaminophen 5-325 MG per tablet  Commonly known as:  PERCOCET/ROXICET  Take 1-2 tablets by mouth every 6 (six) hours as needed for moderate pain or severe pain.     oxyCODONE-acetaminophen 5-325 MG per tablet  Commonly known as:  ROXICET  Take 1-2 tablets by mouth every 4 (four) hours as needed for moderate pain.     QUEtiapine 25 MG tablet  Commonly known as:  SEROQUEL  Take 25 mg by mouth daily.     Testosterone 20.25 MG/1.25GM (1.62%) Gel  Commonly known as:  ANDROGEL  Apply 3 pumps on each thigh     zolpidem 10 MG tablet  Commonly known as:  AMBIEN  Take 10 mg by mouth at bedtime as needed for sleep.        Allergies: No Known Allergies  Past Medical History  Diagnosis Date  . Complication of anesthesia   . PONV (postoperative nausea and vomiting)   . Anxiety   . Mental disorder   . Depression      bi polar  . ADHD (attention deficit hyperactivity disorder)   . Head injury   . Constipation   . Arthritis   . Testosterone deficiency     Past Surgical History  Procedure Laterality Date  . Hand surgery Left     tendon repai x2  . Microdiscectomy lumbar      L4-5  . Wrist surgery Right     reconstution  . Wrist fusion Right 1998  . Wrist arthroscopy    . Vasectomy    . 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 Microdiscectomy lumbar      L4-5    History reviewed. No pertinent family history.  Social History:  reports that he has been smoking Cigars.  He does not have any smokeless tobacco history on file. He reports that he does not drink alcohol or use illicit drugs.  Review of Systems -   No history of diabetes or glucose intolerance No history of hypertension, blood pressure high normal    Assessment/Plan:   Since he has has had relatively low levels on AndroGel 6 pumps a day  and the nasal testosterone is not covered by his insurance will send him to a urologist for testosterone pellet Therapy   Reather LittlerAjay Shakeira Rhee

## 2013-07-17 NOTE — Patient Instructions (Signed)
Androgel 3 pumps to each upper arm or leg daily, allow to dry

## 2013-08-08 ENCOUNTER — Ambulatory Visit
Admission: RE | Admit: 2013-08-08 | Discharge: 2013-08-08 | Disposition: A | Discharge status: Home / Self Care | Payer: BC Managed Care – PPO | Source: Ambulatory Visit | Attending: Family Medicine | Admitting: Family Medicine

## 2013-08-08 ENCOUNTER — Other Ambulatory Visit: Payer: Self-pay | Admitting: Family Medicine

## 2013-08-08 DIAGNOSIS — M79609 Pain in unspecified limb: Secondary | ICD-10-CM

## 2013-09-09 ENCOUNTER — Telehealth: Payer: Self-pay | Admitting: Endocrinology

## 2013-09-09 NOTE — Telephone Encounter (Signed)
Will Dr. Lucianne MussKumar rx him Azucena FreedFortesta the testosterone med

## 2013-09-09 NOTE — Telephone Encounter (Signed)
Please advise 

## 2013-09-10 ENCOUNTER — Telehealth: Payer: Self-pay | Admitting: *Deleted

## 2013-09-10 MED ORDER — TESTOSTERONE 20.25 MG/1.25GM (1.62%) TD GEL: TRANSDERMAL | Status: DC

## 2013-09-10 NOTE — Telephone Encounter (Signed)
Patient said when he looked into the pellet treatment it was very expensive, so he needs to do the [place], rx printed and on your desk for signature.

## 2013-09-10 NOTE — Telephone Encounter (Signed)
He said the only time he's had decent levels have been while he was on fortesta, and he said it's a little cheaper than the androgel.

## 2013-09-10 NOTE — Telephone Encounter (Signed)
He was supposed to be seeing the urologist for pellet treatment. If he is not doing that any contrast Fortesta 3 pumps on each thigh and see me in followup in 2 months with labs

## 2013-09-11 ENCOUNTER — Other Ambulatory Visit: Payer: Self-pay | Admitting: *Deleted

## 2013-09-11 ENCOUNTER — Telehealth: Payer: Self-pay | Admitting: *Deleted

## 2013-09-11 MED ORDER — TESTOSTERONE 10 MG/ACT (2%) TD GEL: TRANSDERMAL | Status: DC

## 2013-09-11 NOTE — Telephone Encounter (Signed)
He said when he checked into the cost of the pellet treatment he realized it was going to be very expensive, so he's opted to continue the [place].  Rx printed and one your desk.

## 2013-10-16 ENCOUNTER — Other Ambulatory Visit: Payer: Self-pay | Admitting: *Deleted

## 2013-10-16 ENCOUNTER — Telehealth: Payer: Self-pay | Admitting: Endocrinology

## 2013-10-16 MED ORDER — TESTOSTERONE 10 MG/ACT (2%) TD GEL: TRANSDERMAL | Status: DC

## 2013-10-16 NOTE — Telephone Encounter (Signed)
Call in fortesta as a generic [facility]

## 2013-10-16 NOTE — Telephone Encounter (Signed)
rx faxed

## 2013-12-25 ENCOUNTER — Other Ambulatory Visit: Payer: Self-pay | Admitting: Endocrinology

## 2014-04-01 ENCOUNTER — Other Ambulatory Visit: Payer: Self-pay | Admitting: Family Medicine

## 2014-04-01 ENCOUNTER — Ambulatory Visit
Admission: RE | Admit: 2014-04-01 | Discharge: 2014-04-01 | Disposition: A | Discharge status: Home / Self Care | Payer: BC Managed Care – PPO | Source: Ambulatory Visit | Attending: Family Medicine | Admitting: Family Medicine

## 2014-04-01 DIAGNOSIS — J019 Acute sinusitis, unspecified: Secondary | ICD-10-CM

## 2014-07-08 ENCOUNTER — Other Ambulatory Visit: Payer: Self-pay | Admitting: Endocrinology

## 2014-07-11 ENCOUNTER — Other Ambulatory Visit: Payer: Self-pay | Admitting: Neurological Surgery

## 2014-07-15 ENCOUNTER — Encounter (HOSPITAL_COMMUNITY)
Admission: RE | Admit: 2014-07-15 | Discharge: 2014-07-15 | Disposition: A | Discharge status: Home / Self Care | Payer: BC Managed Care – PPO | Source: Ambulatory Visit | Attending: Neurological Surgery | Admitting: Neurological Surgery

## 2014-07-15 ENCOUNTER — Encounter (HOSPITAL_COMMUNITY): Payer: Self-pay

## 2014-07-15 ENCOUNTER — Ambulatory Visit (HOSPITAL_COMMUNITY)
Admission: RE | Admit: 2014-07-15 | Discharge: 2014-07-15 | Disposition: A | Discharge status: Home / Self Care | Payer: BC Managed Care – PPO | Source: Ambulatory Visit | Attending: Neurological Surgery | Admitting: Neurological Surgery

## 2014-07-15 DIAGNOSIS — IMO0002 Reserved for concepts with insufficient information to code with codable children: Secondary | ICD-10-CM

## 2014-07-15 LAB — BASIC METABOLIC PANEL
ANION GAP: 10 (ref 5–15)
BUN: 7 mg/dL (ref 6–20)
CO2: 26 mmol/L (ref 22–32)
Calcium: 8.9 mg/dL (ref 8.9–10.3)
Chloride: 102 mmol/L (ref 101–111)
Creatinine, Ser: 0.91 mg/dL (ref 0.61–1.24)
GFR calc non Af Amer: >60 mL/min (ref >60)
GLUCOSE: 85 mg/dL (ref 70–99)
POTASSIUM: 3.9 mmol/L (ref 3.5–5.1)
SODIUM: 138 mmol/L (ref 135–145)

## 2014-07-15 LAB — TYPE AND SCREEN
ABO/RH(D): O POS
Antibody Screen: NEGATIVE

## 2014-07-15 LAB — CBC WITH DIFFERENTIAL/PLATELET
BASOS ABS: 0 10*3/uL (ref 0.0–0.1)
BASOS PCT: 0 % (ref 0–1)
EOS ABS: 0.1 10*3/uL (ref 0.0–0.7)
EOS PCT: 1 % (ref 0–5)
HEMATOCRIT: 45 % (ref 39.0–52.0)
Hemoglobin: 15.6 g/dL (ref 13.0–17.0)
Lymphocytes Relative: 43 % (ref 12–46)
Lymphs Abs: 2.3 10*3/uL (ref 0.7–4.0)
MCH: 31.1 pg (ref 26.0–34.0)
MCHC: 34.7 g/dL (ref 30.0–36.0)
MCV: 89.6 fL (ref 78.0–100.0)
MONO ABS: 0.5 10*3/uL (ref 0.1–1.0)
Monocytes Relative: 10 % (ref 3–12)
Neutro Abs: 2.5 10*3/uL (ref 1.7–7.7)
Neutrophils Relative %: 46 % (ref 43–77)
Platelets: 264 10*3/uL (ref 150–400)
RBC: 5.02 MIL/uL (ref 4.22–5.81)
RDW: 12.7 % (ref 11.5–15.5)
WBC: 5.4 10*3/uL (ref 4.0–10.5)

## 2014-07-15 LAB — PROTIME-INR
INR: 1.06 (ref 0.00–1.49)
Prothrombin Time: 13.9 seconds (ref 11.6–15.2)

## 2014-07-15 LAB — SURGICAL PCR SCREEN
MRSA, PCR: NEGATIVE
Staphylococcus aureus: NEGATIVE

## 2014-07-15 NOTE — Progress Notes (Signed)
   07/15/14 1427  OBSTRUCTIVE SLEEP APNEA  Have you ever been diagnosed with sleep apnea through a sleep study? No  Do you snore loudly (loud enough to be heard through closed doors)?  0  Do you often feel tired, fatigued, or sleepy during the daytime? 0  Has anyone observed you stop breathing during your sleep? 0  Do you have, or are you being treated for high blood pressure? 0  BMI more than 35 kg/m2? 0  Age over 46 years old? 0  Neck circumference greater than 40 cm/16 inches? 1  Gender: 1

## 2014-07-15 NOTE — Pre-Procedure Instructions (Signed)
Shawn Cervantes  07/15/2014   Your procedure is scheduled on:  Thursday, May 5 @ 0730 am  [facility].  Call this number if you have problems the morning of surgery: [phone]   Remember:   Do not eat food or drink liquids after midnight. Wednesday night   Take these medicines the morning of surgery with A SIP OF WATER: oxycodone if needed for pain   Do not wear jewelry.  Do not wear lotions, powders, or perfumes. Do not wear deodorant.  Do not shave 48 hours prior to surgery. Men may shave face and neck.  Do not bring valuables to the hospital.  [facility] is not responsible   for any belongings or valuables.               Contacts, dentures or bridgework may not be worn into surgery.  Leave suitcase in the car. After surgery it may be brought to your room.  For patients admitted to the hospital, discharge time is determined by your   treatment team.     Special Instructions: [place] - Preparing for Surgery  Before surgery, you can play an important role.  Because skin is not sterile, your skin needs to be as free of germs as possible.  You can reduce the number of germs on you skin by washing with CHG (chlorahexidine gluconate) soap before surgery.  CHG is an antiseptic cleaner which kills germs and bonds with the skin to continue killing germs even after washing.  Please DO NOT use if you have an allergy to CHG or antibacterial soaps.  If your skin becomes reddened/irritated stop using the CHG and inform your nurse when you arrive at Short Stay.  Do not shave (including legs and underarms) for at least 48 hours prior to the first CHG shower.  You may shave your face.  Please follow these instructions carefully:   1.  Shower with CHG Soap the night before surgery and the    morning of Surgery.  2.  If you choose to wash your hair, wash your hair first as usual with your   normal shampoo.  3.  After you shampoo, rinse your hair and  body thoroughly to remove the Shampoo.  4.  Use CHG as you would any other liquid soap.  You can apply chg directly   to the skin and wash gently with scrungie or a clean washcloth.  5.  Apply the CHG Soap to your body ONLY FROM THE NECK DOWN.    Do not use on open wounds or open sores.  Avoid contact with your eyes,   ears, mouth and genitals (private parts).  Wash genitals (private parts)   with your normal soap.  6.  Wash thoroughly, paying special attention to the area where your surgery   will be performed.  7.  Thoroughly rinse your body with warm water from the neck down.  8.  DO NOT shower/wash with your normal soap after using and rinsing off   the CHG Soap.  9.  Pat yourself dry with a clean towel.            10.  Wear clean pajamas.            11.  Place clean sheets on your bed the night of your first shower and do not  sleep with pets.  Day of Surgery  Do not apply any lotions/deoderants the morning of  surgery.  Please wear clean clothes to the hospital/surgery center.     Please read over the following fact sheets that you were given: Pain Booklet, Coughing and Deep Breathing, Blood Transfusion Information and Surgical Site Infection Prevention

## 2014-07-16 MED ORDER — CEFAZOLIN SODIUM-DEXTROSE 2-3 GM-% IV SOLR
2.0000 g | INTRAVENOUS | Status: AC
  Administered 2014-07-17: 2 g via INTRAVENOUS
  Filled 2014-07-16: qty 50

## 2014-07-16 MED ORDER — DEXAMETHASONE SODIUM PHOSPHATE 10 MG/ML IJ SOLN
10.0000 mg | INTRAMUSCULAR | Status: AC
  Administered 2014-07-17: 10 mg via INTRAVENOUS
  Filled 2014-07-16: qty 1

## 2014-07-17 ENCOUNTER — Encounter (HOSPITAL_COMMUNITY): Payer: Self-pay | Admitting: *Deleted

## 2014-07-17 ENCOUNTER — Inpatient Hospital Stay (HOSPITAL_COMMUNITY)
Admission: RE | Admit: 2014-07-17 | Discharge: 2014-07-18 | DRG: 460 | Disposition: A | Discharge status: Home / Self Care | Payer: BC Managed Care – PPO | Source: Ambulatory Visit | Attending: Neurological Surgery | Admitting: Neurological Surgery

## 2014-07-17 ENCOUNTER — Inpatient Hospital Stay (HOSPITAL_COMMUNITY): Payer: BC Managed Care – PPO | Admitting: Anesthesiology

## 2014-07-17 ENCOUNTER — Inpatient Hospital Stay (HOSPITAL_COMMUNITY): Payer: BC Managed Care – PPO

## 2014-07-17 ENCOUNTER — Encounter (HOSPITAL_COMMUNITY)
Admission: RE | Disposition: A | Discharge status: Home / Self Care | Payer: Self-pay | Source: Ambulatory Visit | Attending: Neurological Surgery

## 2014-07-17 DIAGNOSIS — Z87891 Personal history of nicotine dependence: Secondary | ICD-10-CM | POA: Diagnosis not present

## 2014-07-17 DIAGNOSIS — M961 Postlaminectomy syndrome, not elsewhere classified: Principal | ICD-10-CM | POA: Diagnosis present

## 2014-07-17 DIAGNOSIS — M545 Low back pain: Secondary | ICD-10-CM | POA: Diagnosis present

## 2014-07-17 DIAGNOSIS — F909 Attention-deficit hyperactivity disorder, unspecified type: Secondary | ICD-10-CM | POA: Diagnosis not present

## 2014-07-17 DIAGNOSIS — M1990 Unspecified osteoarthritis, unspecified site: Secondary | ICD-10-CM | POA: Diagnosis present

## 2014-07-17 DIAGNOSIS — F419 Anxiety disorder, unspecified: Secondary | ICD-10-CM | POA: Diagnosis not present

## 2014-07-17 DIAGNOSIS — M5136 Other intervertebral disc degeneration, lumbar region: Secondary | ICD-10-CM | POA: Diagnosis present

## 2014-07-17 DIAGNOSIS — Z981 Arthrodesis status: Secondary | ICD-10-CM

## 2014-07-17 DIAGNOSIS — M5126 Other intervertebral disc displacement, lumbar region: Secondary | ICD-10-CM | POA: Diagnosis present

## 2014-07-17 DIAGNOSIS — F319 Bipolar disorder, unspecified: Secondary | ICD-10-CM | POA: Diagnosis not present

## 2014-07-17 DIAGNOSIS — Z79899 Other long term (current) drug therapy: Secondary | ICD-10-CM

## 2014-07-17 DIAGNOSIS — Z419 Encounter for procedure for purposes other than remedying health state, unspecified: Secondary | ICD-10-CM

## 2014-07-17 DIAGNOSIS — Y838 Other surgical procedures as the cause of abnormal reaction of the patient, or of later complication, without mention of misadventure at the time of the procedure: Secondary | ICD-10-CM | POA: Diagnosis present

## 2014-07-17 HISTORY — PX: TRANSFORAMINAL LUMBAR INTERBODY FUSION (TLIF) WITH PEDICLE SCREW FIXATION 1 LEVEL: SHX6141

## 2014-07-17 SURGERY — TRANSFORAMINAL LUMBAR INTERBODY FUSION (TLIF) WITH PEDICLE SCREW FIXATION 1 LEVEL
Anesthesia: General | Site: Back

## 2014-07-17 MED ORDER — POTASSIUM CHLORIDE IN NACL 20-0.9 MEQ/L-% IV SOLN
INTRAVENOUS | Status: DC
  Filled 2014-07-17 (×3): qty 1000

## 2014-07-17 MED ORDER — MENTHOL 3 MG MT LOZG: 1.0000 | LOZENGE | OROMUCOSAL | Status: DC | PRN

## 2014-07-17 MED ORDER — MIDAZOLAM HCL 2 MG/2ML IJ SOLN
INTRAMUSCULAR | Status: AC
  Filled 2014-07-17: qty 2

## 2014-07-17 MED ORDER — LIDOCAINE HCL 4 % MT SOLN
OROMUCOSAL | Status: DC | PRN
  Administered 2014-07-17: 4 mL via TOPICAL

## 2014-07-17 MED ORDER — ONDANSETRON HCL 4 MG/2ML IJ SOLN
INTRAMUSCULAR | Status: AC
  Filled 2014-07-17: qty 2

## 2014-07-17 MED ORDER — THROMBIN 5000 UNITS EX SOLR
OROMUCOSAL | Status: DC | PRN
  Administered 2014-07-17: 09:00:00 via TOPICAL

## 2014-07-17 MED ORDER — AMPHETAMINE-DEXTROAMPHETAMINE 10 MG PO TABS: 15.0000 mg | ORAL_TABLET | Freq: Every day | ORAL | Status: DC

## 2014-07-17 MED ORDER — HYDROMORPHONE HCL 1 MG/ML IJ SOLN
0.5000 mg | INTRAMUSCULAR | Status: AC | PRN
  Administered 2014-07-17 (×4): 0.5 mg via INTRAVENOUS

## 2014-07-17 MED ORDER — DIVALPROEX SODIUM ER 500 MG PO TB24
1500.0000 mg | ORAL_TABLET | Freq: Every day | ORAL | Status: DC
  Administered 2014-07-17: 1500 mg via ORAL
  Filled 2014-07-17 (×2): qty 3

## 2014-07-17 MED ORDER — DEXAMETHASONE SODIUM PHOSPHATE 4 MG/ML IJ SOLN
4.0000 mg | Freq: Four times a day (QID) | INTRAMUSCULAR | Status: DC
  Filled 2014-07-17 (×4): qty 1

## 2014-07-17 MED ORDER — PROPOFOL 10 MG/ML IV BOLUS
INTRAVENOUS | Status: DC | PRN
  Administered 2014-07-17: 200 mg via INTRAVENOUS

## 2014-07-17 MED ORDER — ONDANSETRON HCL 4 MG/2ML IJ SOLN: 4.0000 mg | Freq: Once | INTRAMUSCULAR | Status: DC | PRN

## 2014-07-17 MED ORDER — NEOSTIGMINE METHYLSULFATE 10 MG/10ML IV SOLN
INTRAVENOUS | Status: AC
  Filled 2014-07-17: qty 1

## 2014-07-17 MED ORDER — SUCCINYLCHOLINE CHLORIDE 20 MG/ML IJ SOLN
INTRAMUSCULAR | Status: DC | PRN
  Administered 2014-07-17: 120 mg via INTRAVENOUS

## 2014-07-17 MED ORDER — ZOLPIDEM TARTRATE 5 MG PO TABS
5.0000 mg | ORAL_TABLET | Freq: Every evening | ORAL | Status: DC | PRN
  Administered 2014-07-17: 5 mg via ORAL
  Filled 2014-07-17: qty 1

## 2014-07-17 MED ORDER — LIDOCAINE HCL (CARDIAC) 20 MG/ML IV SOLN
INTRAVENOUS | Status: AC
  Filled 2014-07-17: qty 10

## 2014-07-17 MED ORDER — FENTANYL CITRATE (PF) 250 MCG/5ML IJ SOLN
INTRAMUSCULAR | Status: AC
  Filled 2014-07-17: qty 5

## 2014-07-17 MED ORDER — SODIUM CHLORIDE 0.9 % IJ SOLN
3.0000 mL | Freq: Two times a day (BID) | INTRAMUSCULAR | Status: DC
  Administered 2014-07-17 (×2): 3 mL via INTRAVENOUS

## 2014-07-17 MED ORDER — OXYCODONE-ACETAMINOPHEN 5-325 MG PO TABS
1.0000 | ORAL_TABLET | ORAL | Status: DC | PRN
  Administered 2014-07-17 – 2014-07-18 (×5): 2 via ORAL
  Filled 2014-07-17 (×5): qty 2

## 2014-07-17 MED ORDER — CEFAZOLIN SODIUM 1-5 GM-% IV SOLN
1.0000 g | Freq: Three times a day (TID) | INTRAVENOUS | Status: AC
  Administered 2014-07-17 (×2): 1 g via INTRAVENOUS
  Filled 2014-07-17 (×2): qty 50

## 2014-07-17 MED ORDER — CELECOXIB 200 MG PO CAPS
200.0000 mg | ORAL_CAPSULE | Freq: Two times a day (BID) | ORAL | Status: DC
  Administered 2014-07-17 (×2): 200 mg via ORAL
  Filled 2014-07-17 (×4): qty 1

## 2014-07-17 MED ORDER — ONDANSETRON HCL 4 MG/2ML IJ SOLN: 4.0000 mg | INTRAMUSCULAR | Status: DC | PRN

## 2014-07-17 MED ORDER — ACETAMINOPHEN 325 MG PO TABS: 650.0000 mg | ORAL_TABLET | ORAL | Status: DC | PRN

## 2014-07-17 MED ORDER — ARTIFICIAL TEARS OP OINT
TOPICAL_OINTMENT | OPHTHALMIC | Status: AC
  Filled 2014-07-17: qty 3.5

## 2014-07-17 MED ORDER — ROCURONIUM BROMIDE 50 MG/5ML IV SOLN
INTRAVENOUS | Status: AC
  Filled 2014-07-17: qty 2

## 2014-07-17 MED ORDER — SUCCINYLCHOLINE CHLORIDE 20 MG/ML IJ SOLN
INTRAMUSCULAR | Status: AC
  Filled 2014-07-17: qty 1

## 2014-07-17 MED ORDER — LACTATED RINGERS IV SOLN
INTRAVENOUS | Status: DC | PRN
  Administered 2014-07-17 (×2): via INTRAVENOUS

## 2014-07-17 MED ORDER — THROMBIN 20000 UNITS EX SOLR
CUTANEOUS | Status: DC | PRN
  Administered 2014-07-17: 09:00:00 via TOPICAL

## 2014-07-17 MED ORDER — CYCLOBENZAPRINE HCL 10 MG PO TABS
10.0000 mg | ORAL_TABLET | Freq: Three times a day (TID) | ORAL | Status: DC | PRN
  Administered 2014-07-17 – 2014-07-18 (×3): 10 mg via ORAL
  Filled 2014-07-17 (×3): qty 1

## 2014-07-17 MED ORDER — EPHEDRINE SULFATE 50 MG/ML IJ SOLN
INTRAMUSCULAR | Status: AC
  Filled 2014-07-17: qty 1

## 2014-07-17 MED ORDER — ARTIFICIAL TEARS OP OINT
TOPICAL_OINTMENT | OPHTHALMIC | Status: DC | PRN
  Administered 2014-07-17: 1 via OPHTHALMIC

## 2014-07-17 MED ORDER — PHENOL 1.4 % MT LIQD: 1.0000 | OROMUCOSAL | Status: DC | PRN

## 2014-07-17 MED ORDER — CLONAZEPAM 0.5 MG PO TABS: 0.2500 mg | ORAL_TABLET | Freq: Two times a day (BID) | ORAL | Status: DC | PRN

## 2014-07-17 MED ORDER — MIDAZOLAM HCL 5 MG/5ML IJ SOLN
INTRAMUSCULAR | Status: DC | PRN
  Administered 2014-07-17: 2 mg via INTRAVENOUS

## 2014-07-17 MED ORDER — ACETAMINOPHEN 650 MG RE SUPP: 650.0000 mg | RECTAL | Status: DC | PRN

## 2014-07-17 MED ORDER — AMPHETAMINE-DEXTROAMPHETAMINE 15 MG PO TABS: 15.0000 mg | ORAL_TABLET | Freq: Every day | ORAL | Status: DC

## 2014-07-17 MED ORDER — CYCLOBENZAPRINE HCL 10 MG PO TABS
ORAL_TABLET | ORAL | Status: AC
  Filled 2014-07-17: qty 1

## 2014-07-17 MED ORDER — FENTANYL CITRATE (PF) 250 MCG/5ML IJ SOLN
INTRAMUSCULAR | Status: DC | PRN
  Administered 2014-07-17: 150 ug via INTRAVENOUS
  Administered 2014-07-17: 100 ug via INTRAVENOUS

## 2014-07-17 MED ORDER — LIDOCAINE HCL (CARDIAC) 20 MG/ML IV SOLN
INTRAVENOUS | Status: DC | PRN
  Administered 2014-07-17: 60 mg via INTRAVENOUS

## 2014-07-17 MED ORDER — HYDROMORPHONE HCL 1 MG/ML IJ SOLN
INTRAMUSCULAR | Status: AC
  Filled 2014-07-17: qty 1

## 2014-07-17 MED ORDER — SODIUM CHLORIDE 0.9 % IJ SOLN
INTRAMUSCULAR | Status: AC
  Filled 2014-07-17: qty 10

## 2014-07-17 MED ORDER — 0.9 % SODIUM CHLORIDE (POUR BTL) OPTIME
TOPICAL | Status: DC | PRN
  Administered 2014-07-17: 1000 mL

## 2014-07-17 MED ORDER — SODIUM CHLORIDE 0.9 % IR SOLN
Status: DC | PRN
  Administered 2014-07-17: 08:00:00

## 2014-07-17 MED ORDER — SODIUM CHLORIDE 0.9 % IJ SOLN: 3.0000 mL | INTRAMUSCULAR | Status: DC | PRN

## 2014-07-17 MED ORDER — PHENYLEPHRINE 40 MCG/ML (10ML) SYRINGE FOR IV PUSH (FOR BLOOD PRESSURE SUPPORT)
PREFILLED_SYRINGE | INTRAVENOUS | Status: AC
  Filled 2014-07-17: qty 10

## 2014-07-17 MED ORDER — LAMOTRIGINE 100 MG PO TABS
100.0000 mg | ORAL_TABLET | Freq: Every day | ORAL | Status: DC
  Administered 2014-07-17: 100 mg via ORAL
  Filled 2014-07-17 (×2): qty 1

## 2014-07-17 MED ORDER — DEXAMETHASONE 4 MG PO TABS
4.0000 mg | ORAL_TABLET | Freq: Four times a day (QID) | ORAL | Status: DC
  Administered 2014-07-17 – 2014-07-18 (×4): 4 mg via ORAL
  Filled 2014-07-17 (×8): qty 1

## 2014-07-17 MED ORDER — BUPIVACAINE HCL (PF) 0.25 % IJ SOLN
INTRAMUSCULAR | Status: DC | PRN
  Administered 2014-07-17: 3 mL

## 2014-07-17 MED ORDER — GLYCOPYRROLATE 0.2 MG/ML IJ SOLN
INTRAMUSCULAR | Status: AC
  Filled 2014-07-17: qty 2

## 2014-07-17 MED ORDER — GABAPENTIN 600 MG PO TABS
600.0000 mg | ORAL_TABLET | Freq: Every day | ORAL | Status: DC
  Administered 2014-07-17: 600 mg via ORAL
  Filled 2014-07-17 (×2): qty 1

## 2014-07-17 MED ORDER — MORPHINE SULFATE 2 MG/ML IJ SOLN
1.0000 mg | INTRAMUSCULAR | Status: DC | PRN
  Administered 2014-07-17: 2 mg via INTRAVENOUS
  Filled 2014-07-17: qty 1

## 2014-07-17 MED ORDER — PROPOFOL 10 MG/ML IV BOLUS
INTRAVENOUS | Status: AC
  Filled 2014-07-17: qty 20

## 2014-07-17 SURGICAL SUPPLY — 73 items
ADH SKN CLS LQ APL DERMABOND (GAUZE/BANDAGES/DRESSINGS) ×1
APL SKNCLS STERI-STRIP NONHPOA (GAUZE/BANDAGES/DRESSINGS) ×1
BAG DECANTER FOR FLEXI CONT (MISCELLANEOUS) ×2 IMPLANT
BENZOIN TINCTURE PRP APPL 2/3 (GAUZE/BANDAGES/DRESSINGS) ×2 IMPLANT
BLADE CLIPPER SURG (BLADE) ×1 IMPLANT
BONE MATRIX OSTEOCEL PRO SM (Bone Implant) ×2 IMPLANT
BUR MATCHSTICK NEURO 3.0 LAGG (BURR) ×2 IMPLANT
CAGE OBLIQUE TLIF 10X10X30-4 (Cage) ×1 IMPLANT
CANISTER SUCT 3000ML PPV (MISCELLANEOUS) ×2 IMPLANT
CLIP NEUROVISION LG (CLIP) ×1 IMPLANT
CONT SPEC 4OZ CLIKSEAL STRL BL (MISCELLANEOUS) ×4 IMPLANT
COVER BACK TABLE 60X90IN (DRAPES) ×2 IMPLANT
DERMABOND ADHESIVE PROPEN (GAUZE/BANDAGES/DRESSINGS) ×1
DERMABOND ADVANCED .7 DNX6 (GAUZE/BANDAGES/DRESSINGS) IMPLANT
DRAPE C-ARM 42X72 X-RAY (DRAPES) ×3 IMPLANT
DRAPE C-ARMOR (DRAPES) ×1 IMPLANT
DRAPE LAPAROTOMY 100X72X124 (DRAPES) ×2 IMPLANT
DRAPE POUCH INSTRU U-SHP 10X18 (DRAPES) ×2 IMPLANT
DRAPE SURG 17X23 STRL (DRAPES) ×2 IMPLANT
DRSG OPSITE 4X5.5 SM (GAUZE/BANDAGES/DRESSINGS) ×2 IMPLANT
DRSG OPSITE POSTOP 4X6 (GAUZE/BANDAGES/DRESSINGS) ×1 IMPLANT
DRSG TELFA 3X8 NADH (GAUZE/BANDAGES/DRESSINGS) IMPLANT
DURAPREP 26ML APPLICATOR (WOUND CARE) ×2 IMPLANT
ELECT REM PT RETURN 9FT ADLT (ELECTROSURGICAL) ×2
ELECTRODE REM PT RTRN 9FT ADLT (ELECTROSURGICAL) ×1 IMPLANT
EVACUATOR 1/8 PVC DRAIN (DRAIN) ×1 IMPLANT
GAUZE SPONGE 4X4 16PLY XRAY LF (GAUZE/BANDAGES/DRESSINGS) IMPLANT
GLOVE BIO SURGEON STRL SZ 6.5 (GLOVE) ×3 IMPLANT
GLOVE BIO SURGEON STRL SZ8 (GLOVE) ×3 IMPLANT
GLOVE BIOGEL PI IND STRL 6.5 (GLOVE) IMPLANT
GLOVE BIOGEL PI IND STRL 7.5 (GLOVE) IMPLANT
GLOVE BIOGEL PI INDICATOR 6.5 (GLOVE) ×1
GLOVE BIOGEL PI INDICATOR 7.5 (GLOVE) ×1
GLOVE ECLIPSE 8.0 STRL XLNG CF (GLOVE) ×1 IMPLANT
GLOVE SURG SS PI 7.0 STRL IVOR (GLOVE) ×2 IMPLANT
GOWN STRL REUS W/ TWL LRG LVL3 (GOWN DISPOSABLE) IMPLANT
GOWN STRL REUS W/ TWL XL LVL3 (GOWN DISPOSABLE) ×2 IMPLANT
GOWN STRL REUS W/TWL 2XL LVL3 (GOWN DISPOSABLE) IMPLANT
GOWN STRL REUS W/TWL LRG LVL3 (GOWN DISPOSABLE) ×4
GOWN STRL REUS W/TWL XL LVL3 (GOWN DISPOSABLE) ×8
HEMOSTAT POWDER KIT SURGIFOAM (HEMOSTASIS) ×1 IMPLANT
KIT BASIN OR (CUSTOM PROCEDURE TRAY) ×2 IMPLANT
KIT NDL NVM5 EMG ELECT (KITS) IMPLANT
KIT NEEDLE NVM5 EMG ELECT (KITS) ×1 IMPLANT
KIT NEEDLE NVM5 EMG ELECTRODE (KITS) ×1
KIT ROOM TURNOVER OR (KITS) ×2 IMPLANT
MILL MEDIUM DISP (BLADE) ×1 IMPLANT
NDL HYPO 25X1 1.5 SAFETY (NEEDLE) ×1 IMPLANT
NEEDLE HYPO 25X1 1.5 SAFETY (NEEDLE) ×2 IMPLANT
NS IRRIG 1000ML POUR BTL (IV SOLUTION) ×2 IMPLANT
PACK LAMINECTOMY NEURO (CUSTOM PROCEDURE TRAY) ×2 IMPLANT
PAD ARMBOARD 7.5X6 YLW CONV (MISCELLANEOUS) ×6 IMPLANT
PAD DRESSING TELFA 3X8 NADH (GAUZE/BANDAGES/DRESSINGS) ×1 IMPLANT
ROD PREBENT 45MM LUMBAR (Rod) ×2 IMPLANT
SCREW LOCK (Screw) ×8 IMPLANT
SCREW LOCK FXNS SPNE MAS PL (Screw) IMPLANT
SCREW PLIF MAS 5.0X35 (Screw) ×2 IMPLANT
SCREW SHANKS 5.5X35 (Screw) ×2 IMPLANT
SCREW TULIP 5.5 (Screw) ×2 IMPLANT
SPONGE LAP 4X18 X RAY DECT (DISPOSABLE) IMPLANT
SPONGE SURGIFOAM ABS GEL 100 (HEMOSTASIS) ×2 IMPLANT
STRIP CLOSURE SKIN 1/2X4 (GAUZE/BANDAGES/DRESSINGS) ×3 IMPLANT
SUT VIC AB 0 CT1 18XCR BRD8 (SUTURE) ×1 IMPLANT
SUT VIC AB 0 CT1 8-18 (SUTURE) ×2
SUT VIC AB 2-0 CP2 18 (SUTURE) ×2 IMPLANT
SUT VIC AB 3-0 SH 8-18 (SUTURE) ×4 IMPLANT
SYR 20ML ECCENTRIC (SYRINGE) ×2 IMPLANT
TOWEL OR 17X24 6PK STRL BLUE (TOWEL DISPOSABLE) ×2 IMPLANT
TOWEL OR 17X26 10 PK STRL BLUE (TOWEL DISPOSABLE) ×2 IMPLANT
TRAP SPECIMEN MUCOUS 40CC (MISCELLANEOUS) ×1 IMPLANT
TRAY FOLEY CATH 14FRSI W/METER (CATHETERS) ×1 IMPLANT
TRAY FOLEY CATH 16FRSI W/METER (SET/KITS/TRAYS/PACK) ×1 IMPLANT
WATER STERILE IRR 1000ML POUR (IV SOLUTION) ×2 IMPLANT

## 2014-07-17 NOTE — Anesthesia Postprocedure Evaluation (Signed)
  Anesthesia Post-op Note  Patient: Shawn Cervantes  Procedure(s) Performed: Procedure(s): Transforaminal Lumbar Interbody Fusion Lumbar four-five with pedicle screws   (N/A)  Patient Location: PACU  Anesthesia Type:General  Level of Consciousness: awake, alert , oriented and patient cooperative  Airway and Oxygen Therapy: Patient Spontanous Breathing  Post-op Pain: mild  Post-op Assessment: Post-op Vital signs reviewed, Patient's Cardiovascular Status Stable, Respiratory Function Stable, Patent Airway, No signs of Nausea or vomiting and Pain level controlled  Post-op Vital Signs: stable  Last Vitals:  Filed Vitals:   07/17/14 1100  BP:   Pulse: 87  Temp:   Resp: 17    Complications: No apparent anesthesia complications

## 2014-07-17 NOTE — Anesthesia Preprocedure Evaluation (Signed)
Anesthesia Evaluation  Patient identified by MRN, date of birth, ID band Patient awake    Reviewed: Allergy & Precautions, NPO status , Patient's Chart, lab work & pertinent test results  History of Anesthesia Complications (+) PONV  Airway Mallampati: I  TM Distance: >3 FB     Dental   Pulmonary former smoker,    Pulmonary exam normal       Cardiovascular Rhythm:Regular Rate:Normal     Neuro/Psych Anxiety Depression    GI/Hepatic   Endo/Other    Renal/GU      Musculoskeletal  (+) Arthritis -,   Abdominal   Peds  (+) ADHD Hematology   Anesthesia Other Findings   Reproductive/Obstetrics                             Anesthesia Physical Anesthesia Plan  ASA: II  Anesthesia Plan: General   Post-op Pain Management:    Induction: Intravenous  Airway Management Planned: Oral ETT  Additional Equipment:   Intra-op Plan:   Post-operative Plan: Extubation in OR  Informed Consent: I have reviewed the patients History and Physical, chart, labs and discussed the procedure including the risks, benefits and alternatives for the proposed anesthesia with the patient or authorized representative who has indicated his/her understanding and acceptance.     Plan Discussed with: CRNA, Anesthesiologist and Surgeon  Anesthesia Plan Comments:         Anesthesia Quick Evaluation

## 2014-07-17 NOTE — Op Note (Signed)
07/17/2014  10:43 AM  PATIENT:  Shawn Cervantes  44 y.o. 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 Decompressive lumbar laminectomy L4-5 requiring more work than would be required for a simple exposure of the disk for PLIF in order to adequately decompress the neural elements and address the spinal stenosis 2. Transforaminal lumbar interbody fusion L4-5 using PEEK interbody cage packed with morcellized allograft and autograft 3. Posterior fixation L4-5 using cortical pedicle screws.    SURGEON:  Marikay Alaravid Fitchett, MD  ASSISTANTS: None  ANESTHESIA:  General  EBL: 100 ml     BLOOD ADMINISTERED:none  DRAINS: None   INDICATION FOR PROCEDURE: This patient underwent 2 previous right L4-5 microdiscectomy by another surgeon in the remote past. He presented with severe chronic discogenic back pain with some right leg pain. He tried medical management without relief. MRI showed severe degenerative disc disease with Modic endplate changes at L4-5. I recommended decompression and instrumented fusion. Patient understood the risks, benefits, and alternatives and potential outcomes and wished to proceed.  PROCEDURE DETAILS:  The patient was brought to the operating room. After induction of generalized endotracheal anesthesia the patient was rolled into the prone position on chest rolls and all pressure points were padded. The patient's lumbar region was cleaned and then prepped with DuraPrep and draped in the usual sterile fashion. Anesthesia was injected and then a dorsal midline incision was made and carried down to the lumbosacral fascia. The fascia was opened and the paraspinous musculature was taken down in a subperiosteal fashion to expose L4-5 bilaterally. A self-retaining retractor was placed. Intraoperative fluoroscopy confirmed my level, and I started  with placement of the L4 cortical pedicle screws. The pedicle screw entry zones were identified utilizing surface landmarks and  AP and lateral fluoroscopy. I scored the cortex with the high-speed drill and then used the hand drill and EMG monitoring to drill an upward and outward direction into the pedicle. I then tapped line to line, and the tap was also monitored. I then placed a 5-0 x 35 mm cortical pedicle screw into the pedicles of L4 bilaterally. I then turned my attention to the decompression and the spinous process was removed and complete lumbar laminectomies, hemi- facetectomies, and foraminotomies were performed at L4-5 on the left. The patient had significant spinal stenosis and this required more work than would be required for a simple exposure of the disc for posterior lumbar interbody fusion. Much more generous decompression was undertaken in order to adequately decompress the neural elements and address the patient's leg pain. The yellow ligament was removed to expose the underlying dura and nerve roots, and a generous foraminotomies was performed to adequately decompress the neural elements. Both the exiting and traversing nerve root was decompressed on the left until a coronary dilator passed easily along the nerve roots. Once the decompression was complete, I turned my attention to the transforaminal lumbar interbody fusion. The epidural venous vasculature was coagulated and cut sharply. Disc space was incised and the initial discectomy was performed with pituitary rongeurs. The disc space was distracted with sequential distractors to a height of 9 mm. We then used a series of scrapers and shavers to prepare the endplates for fusion. The midline was prepared with Epstein curettes. Once the complete discectomy was finished, I packed the disc space with 8 mL of morcellized autograft and allograft. we packed  an appropriate sized peek interbody cage with local autograft and morcellized allograft,  gently retracted the nerve root, and tapped the cage into position at L4-5 on the left. We then turned our attention to the placement of the lower pedicle screws. The pedicle screw entry zones were identified utilizing surface landmarks and fluoroscopy. I drilled into each pedicle utilizing the hand drill and EMG monitoring, and tapped each pedicle with the appropriate tap. We palpated with a ball probe to assure no break in the cortex. We then placed 5-0 x 35 mm cortical pedicle screws into the pedicles bilaterally at L5.  We then placed lordotic rods into the multiaxial screw heads of the pedicle screws and locked these in position with the locking caps and anti-torque device. We then checked our construct with AP and lateral fluoroscopy. Irrigated with copious amounts of bacitracin-containing saline solution.  Inspected the nerve roots once again to assure adequate decompression, lined to the dura with Gelfoam, and closed the muscle and the fascia with 0 Vicryl. Closed the subcutaneous tissues with 2-0 Vicryl and subcuticular tissues with 3-0 Vicryl. The skin was closed with benzoin and Steri-Strips. Dressing was then applied, the patient was awakened from general anesthesia and transported to the recovery room in stable condition. At the end of the procedure all sponge, needle and instrument counts were correct.   PLAN OF CARE: Admit to inpatient   PATIENT DISPOSITION:  PACU - hemodynamically stable.   Delay start of Pharmacological VTE agent (>24hrs) due to surgical blood loss or risk of bleeding:  yes

## 2014-07-17 NOTE — Evaluation (Signed)
Physical Therapy Evaluation Patient Details Name: Shawn Cervantes MRN: [id number] DOB: 03/14/1970 Today's Date: 07/17/2014   History of Present Illness  Patient is a 44 y/o male s/p L4-5 TLIF. PMH of anxiety/depression, ADHD, s/p laminectomy and microdiscectomy 03/2013 and arthritis.  Clinical Impression  Patient presents with pain and guarded mobility secondary to above surgery. Education provided on back precautions. Pt tolerated ambulation with Min guard-S level for safety due to pain. Encourage ambulation with RN later in day to improve mobility/strength. Pt's wife will be staying with him til over the weekend. Would benefit from skilled PT to improve transfers, gait, balance and mobility so pt can maximize independence and return to PLOF.     Follow Up Recommendations No PT follow up;Supervision for mobility/OOB    Equipment Recommendations  None recommended by PT    Recommendations for Other Services       Precautions / Restrictions Precautions Precautions: Back Precaution Booklet Issued: Yes (comment) Precaution Comments: Reviewed precautions. Required Braces or Orthoses: Spinal Brace Spinal Brace: Lumbar corset;Applied in sitting position Restrictions Weight Bearing Restrictions: No      Mobility  Bed Mobility Overal bed mobility: Needs Assistance Bed Mobility: Rolling;Sidelying to Sit Rolling: Supervision Sidelying to sit: Supervision       General bed mobility comments: HOB flat, no use of rails to simulate home environment. Cues for log roll technique.  Transfers Overall transfer level: Needs assistance Equipment used: None Transfers: Sit to/from Stand Sit to Stand: Min guard         General transfer comment: Min guard for safety as pt reporting some left hip stiffness/pain upon standing.  Ambulation/Gait Ambulation/Gait assistance: Min guard;Supervision Ambulation Distance (Feet): 200 Feet Assistive device: None (rail in HR at times) Gait  Pattern/deviations: Step-through pattern;Decreased step length - right;Decreased step length - left   Gait velocity interpretation: Below normal speed for age/gender General Gait Details: Slow, mildly unsteady gait initially progressing to more steady with increased speed. Holding onto rail in hallway for support at times.   Stairs Stairs: Yes Stairs assistance: Supervision Stair Management: Step to pattern;One rail Right;Forwards Number of Stairs: 3 General stair comments: Cues for technique.  Wheelchair Mobility    Modified Rankin (Stroke Patients Only)       Balance Overall balance assessment: No apparent balance deficits (not formally assessed)                                           Pertinent Vitals/Pain Pain Assessment: 0-10 Pain Score: 5  Pain Location: back and into right thigh Pain Descriptors / Indicators: Aching Pain Intervention(s): Monitored during session;Repositioned    Home Living Family/patient expects to be discharged to:: Private residence Living Arrangements: Spouse/significant other Available Help at Discharge: Family;Available 24 hours/day (for the next 3 days and then his mother may come down) Type of Home: House Home Access: Stairs to enter Entrance Stairs-Rails: Right Entrance Stairs-Number of Steps: 3 Home Layout: One level Home Equipment: Other (comment) (walking stick)      Prior Function Level of Independence: Independent with assistive device(s)         Comments: Pt using walking stick at times for ambulation.      Hand Dominance        Extremity/Trunk Assessment   Upper Extremity Assessment: Defer to OT evaluation           Lower Extremity Assessment: Generalized weakness  Communication   Communication: No difficulties  Cognition Arousal/Alertness: Awake/alert Behavior During Therapy: WFL for tasks assessed/performed Overall Cognitive Status: Within Functional Limits for tasks  assessed                      General Comments      Exercises        Assessment/Plan    PT Assessment Patient needs continued PT services  PT Diagnosis Acute pain;Difficulty walking   PT Problem List Decreased strength;Pain;Decreased mobility;Decreased knowledge of precautions;Decreased activity tolerance  PT Treatment Interventions Balance training;Gait training;Stair training;Patient/family education;Functional mobility training;Therapeutic activities;Therapeutic exercise   PT Goals (Current goals can be found in the Care Plan section) Acute Rehab PT Goals Patient Stated Goal: to return home PT Goal Formulation: With patient Time For Goal Achievement: 07/31/14 Potential to Achieve Goals: Good    Frequency Min 5X/week   Barriers to discharge        Co-evaluation               End of Session Equipment Utilized During Treatment: Gait belt;Back brace Activity Tolerance: Patient tolerated treatment well Patient left: Other (comment) (with OT in hallway) Nurse Communication: Mobility status         Time: [id number] PT Time Calculation (min) (ACUTE ONLY): 12 min   Charges:   PT Evaluation $Initial PT Evaluation Tier I: 1 Procedure     PT G CodesAlvie Heidelberg:        Folan, Congetta Odriscoll A 07/17/2014, 4:49 PM Mylo RedShauna Shalay Carder, PT, DPT [phone]

## 2014-07-17 NOTE — Transfer of Care (Signed)
Immediate Anesthesia Transfer of Care Note  Patient: Shawn Cervantes  Procedure(s) Performed: Procedure(s): Transforaminal Lumbar Interbody Fusion Lumbar four-five with pedicle screws   (N/A)  Patient Location: PACU  Anesthesia Type:General  Level of Consciousness: awake, alert  and oriented  Airway & Oxygen Therapy: Patient Spontanous Breathing and Patient connected to nasal cannula oxygen  Post-op Assessment: Report given to RN and Post -op Vital signs reviewed and stable  Post vital signs: Reviewed and stable  Last Vitals:  Filed Vitals:   07/17/14 1040  BP:   Pulse:   Temp: 37 C  Resp:     Complications: No apparent anesthesia complications

## 2014-07-17 NOTE — Plan of Care (Signed)
Problem: Consults Goal: Diagnosis - Spinal Surgery Outcome: Completed/Met Date Met:  07/17/14 Thoraco/Lumbar Spine Fusion     

## 2014-07-17 NOTE — H&P (Signed)
Subjective: Patient is a 44 y.o. male admitted forTLIF L4-5 for post-laminectomy syndrome. Onset of symptoms was 2 yrs ago, progresively worse since that time.  The pain is rated severe, and is located at the low back and radiates to leg. He is s/p R microdiskectomy by another surgeon. The pain is described as aching and occurs all day.  The symptoms have been progressive. Symptoms are exacerbated by activity. MRI or CT showed DDD, recurrent HNP L4-5.   Past Medical History  Diagnosis Date  . Complication of anesthesia   . PONV (postoperative nausea and vomiting)   . Anxiety   . Mental disorder     Bipolar  . Depression     bi polar  . ADHD (attention deficit hyperactivity disorder)   . Head injury     as a child ;had stitches in head  . Constipation   . Testosterone deficiency   . Arthritis     knee and back    Past Surgical History  Procedure Laterality Date  . Hand surgery Left     tendon repai x2  . Microdiscectomy lumbar      L4-5  . Wrist surgery Right     reconstution  . Wrist fusion Right 1998  . Wrist arthroscopy    . Vasectomy    . Lumbar laminectomy Right 04/01/2013    Procedure: MICRODISCECTOMY LUMBAR LAMINECTOMY;  Surgeon: Eldred MangesMark C Yates, MD;  [facility];  Service: Orthopedics;  Laterality: Right;  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  . Microdiscectomy lumbar      L4-5    Prior to Admission medications   Medication Sig Start Date End Date Taking? Authorizing Provider  amphetamine-dextroamphetamine (ADDERALL) 15 MG tablet Take 15 mg by mouth daily.  09/26/12  Yes Historical Provider, MD  clonazePAM (KLONOPIN) 0.5 MG tablet Take 0.25 mg by mouth as needed for anxiety.  08/21/12  Yes Historical Provider, MD  cyclobenzaprine (FLEXERIL) 10 MG tablet Take 10 mg by mouth daily as needed for muscle spasms.  08/27/12  Yes Historical Provider, MD  divalproex (DEPAKOTE ER) 500 MG 24 hr tablet Take 1,500 mg by mouth at bedtime.   Yes Historical Provider, MD  fish  oil-omega-3 fatty acids 1000 MG capsule Take 2 g by mouth daily.   Yes Historical Provider, MD  gabapentin (NEURONTIN) 600 MG tablet Take 600 mg by mouth at bedtime.   Yes Historical Provider, MD  lamoTRIgine (LAMICTAL) 100 MG tablet Take 100 mg by mouth daily.    Yes Historical Provider, MD  oxyCODONE-acetaminophen (ROXICET) 5-325 MG per tablet Take 1-2 tablets by mouth every 4 (four) hours as needed for moderate pain. 04/01/13  Yes Maud DeedSheila Vernon, PA-C  Testosterone 10 MG/ACT (2%) GEL APPLY 3 PUMPS ON EACH SIDE AS DIRECTED 07/08/14  Yes Reather LittlerAjay Kumar, MD  zolpidem (AMBIEN) 10 MG tablet Take 10 mg by mouth at bedtime as needed for sleep.   Yes Historical Provider, MD   No Known Allergies  History  Substance Use Topics  . Smoking status: Former Smoker -- 0.00 packs/day    Types: Cigars    Quit date: 10/14/2013  . Smokeless tobacco: Not on file  . Alcohol Use: No    History reviewed. No pertinent family history.   Review of Systems  Positive ROS: neg  All other systems have been reviewed and were otherwise negative with the exception of those mentioned in the HPI and as above.  Objective: Vital signs in last 24 hours: Temp:  [97.2 F (  36.2 C)] 97.2 F (36.2 C) (05/05 0616) Pulse Rate:  [71] 71 (05/05 0616) Resp:  [20] 20 (05/05 0616) BP: (128)/(67) 128/67 mmHg (05/05 0616) SpO2:  [95 %] 95 % (05/05 0616) Weight:  [239 lb 3.2 oz (108.5 kg)] 239 lb 3.2 oz (108.5 kg) (05/05 0612)  General Appearance: Alert, cooperative, no distress, appears stated age Head: Normocephalic, without obvious abnormality, atraumatic Eyes: PERRL, conjunctiva/corneas clear, EOM's intact    Neck: Supple, symmetrical, trachea midline Back: Symmetric, no curvature, ROM normal, no CVA tenderness Lungs:  respirations unlabored Heart: Regular rate and rhythm Abdomen: Soft, non-tender Extremities: Extremities normal, atraumatic, no cyanosis or edema Pulses: 2+ and symmetric all extremities Skin: Skin color,  texture, turgor normal, no rashes or lesions  NEUROLOGIC:   Mental status: Alert and oriented x4,  no aphasia, good attention span, fund of knowledge, and memory Motor Exam - grossly normal Sensory Exam - grossly normal Reflexes: 1+ Coordination - grossly normal Gait - grossly normal Balance - grossly normal Cranial Nerves: I: smell Not tested  II: visual acuity  OS: nl    OD: nl  II: visual fields Full to confrontation  II: pupils Equal, round, reactive to light  III,VII: ptosis None  III,IV,VI: extraocular muscles  Full ROM  V: mastication Normal  V: facial light touch sensation  Normal  V,VII: corneal reflex  Present  VII: facial muscle function - upper  Normal  VII: facial muscle function - lower Normal  VIII: hearing Not tested  IX: soft palate elevation  Normal  IX,X: gag reflex Present  XI: trapezius strength  5/5  XI: sternocleidomastoid strength 5/5  XI: neck flexion strength  5/5  XII: tongue strength  Normal    Data Review Lab Results  Component Value Date   WBC 5.4 07/15/2014   HGB 15.6 07/15/2014   HCT 45.0 07/15/2014   MCV 89.6 07/15/2014   PLT 264 07/15/2014   Lab Results  Component Value Date   NA 138 07/15/2014   K 3.9 07/15/2014   CL 102 07/15/2014   CO2 26 07/15/2014   BUN 7 07/15/2014   CREATININE 0.91 07/15/2014   GLUCOSE 85 07/15/2014   Lab Results  Component Value Date   INR 1.06 07/15/2014    Assessment/Plan: Patient admitted for TLIF L4-5. Patient has failed a reasonable attempt at conservative therapy.  I explained the condition and procedure to the patient and answered any questions.  Patient wishes to proceed with procedure as planned. Understands risks/ benefits and typical outcomes of procedure.   Santalucia,Myasia Sinatra S 07/17/2014 6:26 AM

## 2014-07-17 NOTE — Anesthesia Procedure Notes (Signed)
Procedure Name: Intubation Date/Time: 07/17/2014 7:32 AM Performed by: Yvonne KendallBECKNER, Clemmie Marxen S Patient Re-evaluated:Patient Re-evaluated prior to inductionOxygen Delivery Method: Circle system utilized Preoxygenation: Pre-oxygenation with 100% oxygen Intubation Type: IV induction Laryngoscope Size: Miller and 3 Grade View: Grade I Tube type: Oral Tube size: 8.0 mm Number of attempts: 1 Placement Confirmation: ETT inserted through vocal cords under direct vision,  breath sounds checked- equal and bilateral and positive ETCO2 Secured at: 23 cm Tube secured with: Tape Dental Injury: Teeth and Oropharynx as per pre-operative assessment

## 2014-07-17 NOTE — Evaluation (Signed)
Occupational Therapy Evaluation Patient Details Name: Junie BameDerrik T Cozad MRN: [id number] DOB: 04/06/1970 Today's Date: 07/17/2014    History of Present Illness Patient is a 44 y/o male s/p L4-5 TLIF. PMH of anxiety/depression, ADHD, s/p laminectomy and microdiscectomy 03/2013 and arthritis.   Clinical Impression   PTA pt lived at home and was independent with PRN use of walking stick. Pt currently at Mod I level and all education and training completed. Pt will have 24/7 assistance at home. No further acute OT needs.     Follow Up Recommendations  No OT follow up    Equipment Recommendations  None recommended by OT    Recommendations for Other Services       Precautions / Restrictions Precautions Precautions: Back Precaution Booklet Issued: Yes (comment) Precaution Comments: Reviewed precautions. Required Braces or Orthoses: Spinal Brace Spinal Brace: Lumbar corset;Applied in sitting position Restrictions Weight Bearing Restrictions: No      Mobility Bed Mobility Overal bed mobility: Needs Assistance Bed Mobility: Rolling;Sidelying to Sit Rolling: Supervision Sidelying to sit: Supervision       General bed mobility comments: Pt in hallway with PT when OT arrived. Pt left standing in room with his wife due to comfort. Verbalized log roll technique.   Transfers Overall transfer level: Modified independent Equipment used: None Transfers: Sit to/from Stand Sit to Stand: Min guard         General transfer comment: Min guard for safety as pt reporting some left hip stiffness/pain upon standing.    Balance Overall balance assessment: No apparent balance deficits (not formally assessed)                                          ADL Overall ADL's : Modified independent                                       General ADL Comments: Pt at Mod I for ADLs. Educated on incorporating back precautions into ADLs including use of cup at sink,  figure four method to don shoes, toilet hygiene, and safety with dressing/bathing. No further acute OT needs.      Vision Additional Comments: No change from baseline          Pertinent Vitals/Pain Pain Assessment: 0-10 Pain Score: 5  Pain Location: back and right thigh Pain Descriptors / Indicators: Aching Pain Intervention(s): Monitored during session     Hand Dominance Right   Extremity/Trunk Assessment Upper Extremity Assessment Upper Extremity Assessment: Overall WFL for tasks assessed   Lower Extremity Assessment Lower Extremity Assessment: Defer to PT evaluation   Cervical / Trunk Assessment Cervical / Trunk Assessment: Normal   Communication Communication Communication: No difficulties   Cognition Arousal/Alertness: Awake/alert Behavior During Therapy: WFL for tasks assessed/performed Overall Cognitive Status: Within Functional Limits for tasks assessed                                Home Living Family/patient expects to be discharged to:: Private residence Living Arrangements: Spouse/significant other Available Help at Discharge: Family;Available 24 hours/day Type of Home: House Home Access: Stairs to enter Entergy CorporationEntrance Stairs-Number of Steps: 3 Entrance Stairs-Rails: Right Home Layout: One level  Home Equipment:  (walking stick)          Prior Functioning/Environment Level of Independence: Independent with assistive device(s)        Comments: Pt using walking stick at times for ambulation.     OT Diagnosis: Generalized weakness;Acute pain    End of Session Equipment Utilized During Treatment: Back brace  Activity Tolerance: Patient tolerated treatment well Patient left: Other (comment) (standing in room with wife for comfort)   Time: 1630-1640 OT Time Calculation (min): 10 min Charges:  OT General Charges $OT Visit: 1 Procedure OT Evaluation $Initial OT Evaluation Tier I: 1 Procedure G-Codes:    Rae LipsMiller,  Mirka Barbone M 07/17/2014, 5:25 PM  Carney LivingLeeAnn Marie Keatin Benham, OTR/L Occupational Therapist [phone] (pager)

## 2014-07-18 MED ORDER — OXYCODONE-ACETAMINOPHEN 5-325 MG PO TABS: 1.0000 | ORAL_TABLET | ORAL | Status: DC | PRN

## 2014-07-18 MED ORDER — CYCLOBENZAPRINE HCL 10 MG PO TABS: 10.0000 mg | ORAL_TABLET | Freq: Three times a day (TID) | ORAL | Status: DC | PRN

## 2014-07-18 NOTE — Discharge Summary (Signed)
Physician Discharge Summary  Patient ID: Shawn Cervantes MRN: [id number] DOB/AGE: 46/11/1970 46 y.o.  Admit date: 07/17/2014 Discharge date: 07/18/2014  Admission Diagnoses: post-laminectomy syndrome/ DDD L4-5    Discharge Diagnoses: same   Discharged Condition: good  Hospital Course: The patient was admitted on 07/17/2014 and taken to the operating room where the patient underwent PLIF L4-5. The patient tolerated the procedure well and was taken to the recovery room and then to the floor in stable condition. The hospital course was routine. There were no complications. The wound remained clean dry and intact. Pt had appropriate back soreness. No complaints of leg pain or new N/T/W. The patient remained afebrile with stable vital signs, and tolerated a regular diet. The patient continued to increase activities, and pain was well controlled with oral pain medications.   Consults: None  Significant Diagnostic Studies:  Results for orders placed or performed during the hospital encounter of 07/15/14  Surgical pcr screen  Result Value Ref Range   MRSA, PCR NEGATIVE NEGATIVE   Staphylococcus aureus NEGATIVE NEGATIVE  Basic metabolic panel  Result Value Ref Range   Sodium 138 135 - 145 mmol/L   Potassium 3.9 3.5 - 5.1 mmol/L   Chloride 102 101 - 111 mmol/L   CO2 26 22 - 32 mmol/L   Glucose, Bld 85 70 - 99 mg/dL   BUN 7 6 - 20 mg/dL   Creatinine, Ser 4.090.91 0.61 - 1.24 mg/dL   Calcium 8.9 8.9 - 81.110.3 mg/dL   GFR calc non Af Amer >60 >60 mL/min   GFR calc Af Amer >60 >60 mL/min   Anion gap 10 5 - 15  CBC WITH DIFFERENTIAL  Result Value Ref Range   WBC 5.4 4.0 - 10.5 K/uL   RBC 5.02 4.22 - 5.81 MIL/uL   Hemoglobin 15.6 13.0 - 17.0 g/dL   HCT 91.445.0 78.239.0 - 95.652.0 %   MCV 89.6 78.0 - 100.0 fL   MCH 31.1 26.0 - 34.0 pg   MCHC 34.7 30.0 - 36.0 g/dL   RDW 21.312.7 08.611.5 - 57.815.5 %   Platelets 264 150 - 400 K/uL   Neutrophils Relative % 46 43 - 77 %   Neutro Abs 2.5 1.7 - 7.7 K/uL   Lymphocytes  Relative 43 12 - 46 %   Lymphs Abs 2.3 0.7 - 4.0 K/uL   Monocytes Relative 10 3 - 12 %   Monocytes Absolute 0.5 0.1 - 1.0 K/uL   Eosinophils Relative 1 0 - 5 %   Eosinophils Absolute 0.1 0.0 - 0.7 K/uL   Basophils Relative 0 0 - 1 %   Basophils Absolute 0.0 0.0 - 0.1 K/uL  Protime-INR  Result Value Ref Range   Prothrombin Time 13.9 11.6 - 15.2 seconds   INR 1.06 0.00 - 1.49  Type and screen  Result Value Ref Range   ABO/RH(D) O POS    Antibody Screen NEG    Sample Expiration 07/18/2014     Chest 2 View  07/15/2014   CLINICAL DATA:  Preop for lumbar spine surgery.  EXAM: CHEST - 2 VIEW  COMPARISON:  Two-view chest 03/29/2013  FINDINGS: The heart size and mediastinal contours are within normal limits. Both lungs are clear. The visualized skeletal structures are unremarkable.  IMPRESSION: Negative two view chest x-ray   Electronically Signed   By: Marin Robertshristopher  Mattern M.D.   On: 07/15/2014 16:57   Dg Lumbar Spine 2-3 Views  07/17/2014   CLINICAL DATA:  L4-5 MAS TLIF  EXAM:  DG C-ARM 61-120 MIN; LUMBAR SPINE - 2-3 VIEW  COMPARISON:  06/05/2014  FLUOROSCOPY TIME:  Radiation Exposure Index (as provided by the fluoroscopic device): Not available  If the device does not provide the exposure index:  Fluoroscopy Time:  1 minutes 22 seconds  Number of Acquired Images:  2  FINDINGS: Pedicle screws are noted at L4-5 with posterior fixation as well as interbody fusion at this level. The numbering nomenclature is similar to that used on prior MRI examination.  IMPRESSION: Interbody fusion at L4-5.   Electronically Signed   By: Alcide Clever M.D.   On: 07/17/2014 10:15   Dg C-arm 1-60 Min  07/17/2014   CLINICAL DATA:  L4-5 MAS TLIF  EXAM: DG C-ARM 61-120 MIN; LUMBAR SPINE - 2-3 VIEW  COMPARISON:  06/05/2014  FLUOROSCOPY TIME:  Radiation Exposure Index (as provided by the fluoroscopic device): Not available  If the device does not provide the exposure index:  Fluoroscopy Time:  1 minutes 22 seconds  Number of  Acquired Images:  2  FINDINGS: Pedicle screws are noted at L4-5 with posterior fixation as well as interbody fusion at this level. The numbering nomenclature is similar to that used on prior MRI examination.  IMPRESSION: Interbody fusion at L4-5.   Electronically Signed   By: Alcide Clever M.D.   On: 07/17/2014 10:15    Antibiotics:  Anti-infectives    Start     Dose/Rate Route Frequency Ordered Stop   07/17/14 1400  ceFAZolin (ANCEF) IVPB 1 g/50 mL premix     1 g 100 mL/hr over 30 Minutes Intravenous Every 8 hours 07/17/14 1200 07/17/14 2158   07/17/14 0829  bacitracin 50,000 Units in sodium chloride irrigation 0.9 % 500 mL irrigation  Status:  Discontinued       As needed 07/17/14 0830 07/17/14 1037   07/17/14 0700  ceFAZolin (ANCEF) IVPB 2 g/50 mL premix     2 g 100 mL/hr over 30 Minutes Intravenous To [facility] 07/16/14 1421 07/17/14 0742      Discharge Exam: Blood pressure 112/64, pulse 90, temperature 98.5 F (36.9 C), temperature source Oral, resp. rate 20, height  (1.854 m), weight 239 lb 3.2 oz (108.5 kg), SpO2 97 %. Neurologic: Grossly normal Incision CDI  Discharge Medications:     Medication List    TAKE these medications        amphetamine-dextroamphetamine 15 MG tablet  Commonly known as:  ADDERALL  Take 15 mg by mouth daily.     clonazePAM 0.5 MG tablet  Commonly known as:  KLONOPIN  Take 0.25 mg by mouth as needed for anxiety.     cyclobenzaprine 10 MG tablet  Commonly known as:  FLEXERIL  Take 1 tablet (10 mg total) by mouth 3 (three) times daily as needed for muscle spasms.     divalproex 500 MG 24 hr tablet  Commonly known as:  DEPAKOTE ER  Take 1,500 mg by mouth at bedtime.     fish oil-omega-3 fatty acids 1000 MG capsule  Take 2 g by mouth daily.     gabapentin 600 MG tablet  Commonly known as:  NEURONTIN  Take 600 mg by mouth at bedtime.     lamoTRIgine 100 MG tablet  Commonly known as:  LAMICTAL  Take 100 mg by mouth daily.      oxyCODONE-acetaminophen 5-325 MG per tablet  Commonly known as:  ROXICET  Take 1-2 tablets by mouth every 4 (four) hours as needed for moderate pain.  Testosterone 10 MG/ACT (2%) Gel  APPLY 3 PUMPS ON EACH SIDE AS DIRECTED     zolpidem 10 MG tablet  Commonly known as:  AMBIEN  Take 10 mg by mouth at bedtime as needed for sleep.        Disposition: home   Final Dx: PLIF L4-5      Discharge Instructions     Remove dressing in 72 hours    Complete by:  As directed      Call MD for:  difficulty breathing, headache or visual disturbances    Complete by:  As directed      Call MD for:  persistant nausea and vomiting    Complete by:  As directed      Call MD for:  redness, tenderness, or signs of infection (pain, swelling, redness, odor or green/yellow discharge around incision site)    Complete by:  As directed      Call MD for:  severe uncontrolled pain    Complete by:  As directed      Call MD for:  temperature >100.4    Complete by:  As directed      Diet - low sodium heart healthy    Complete by:  As directed      Discharge instructions    Complete by:  As directed   No heavy lifting, no strenuous activity, no bending or twisting, no driving     Increase activity slowly    Complete by:  As directed               Signed: Garden,Yaa Donnellan S 07/18/2014, 7:24 AM

## 2014-07-18 NOTE — Progress Notes (Signed)
Patient alert and oriented, mae's well, voiding adequate amount of urine, swallowing without difficulty, no c/o pain. Patient discharged home with family. Script and discharged instructions given to patient. Patient and family stated understanding of d/c instructions given and has an appointment with MD. 

## 2014-07-22 ENCOUNTER — Encounter (HOSPITAL_COMMUNITY): Payer: Self-pay | Admitting: Neurological Surgery

## 2014-07-29 ENCOUNTER — Other Ambulatory Visit: Payer: Self-pay | Admitting: Neurological Surgery

## 2014-07-29 ENCOUNTER — Encounter (HOSPITAL_COMMUNITY): Payer: Self-pay | Admitting: *Deleted

## 2014-07-30 ENCOUNTER — Encounter (HOSPITAL_COMMUNITY): Payer: Self-pay | Admitting: *Deleted

## 2014-07-30 ENCOUNTER — Ambulatory Visit (HOSPITAL_COMMUNITY): Payer: BC Managed Care – PPO | Admitting: Certified Registered Nurse Anesthetist

## 2014-07-30 ENCOUNTER — Ambulatory Visit (HOSPITAL_COMMUNITY)
Admission: RE | Admit: 2014-07-30 | Discharge: 2014-07-31 | Disposition: A | Discharge status: Home / Self Care | Payer: BC Managed Care – PPO | Source: Ambulatory Visit | Attending: Neurological Surgery | Admitting: Neurological Surgery

## 2014-07-30 ENCOUNTER — Encounter (HOSPITAL_COMMUNITY)
Admission: RE | Disposition: A | Discharge status: Home / Self Care | Payer: Self-pay | Source: Ambulatory Visit | Attending: Neurological Surgery

## 2014-07-30 DIAGNOSIS — G9751 Postprocedural hemorrhage and hematoma of a nervous system organ or structure following a nervous system procedure: Secondary | ICD-10-CM | POA: Diagnosis not present

## 2014-07-30 DIAGNOSIS — M179 Osteoarthritis of knee, unspecified: Secondary | ICD-10-CM | POA: Diagnosis not present

## 2014-07-30 DIAGNOSIS — F419 Anxiety disorder, unspecified: Secondary | ICD-10-CM | POA: Diagnosis not present

## 2014-07-30 DIAGNOSIS — E291 Testicular hypofunction: Secondary | ICD-10-CM | POA: Insufficient documentation

## 2014-07-30 DIAGNOSIS — Z87891 Personal history of nicotine dependence: Secondary | ICD-10-CM | POA: Insufficient documentation

## 2014-07-30 DIAGNOSIS — M479 Spondylosis, unspecified: Secondary | ICD-10-CM | POA: Insufficient documentation

## 2014-07-30 DIAGNOSIS — Y838 Other surgical procedures as the cause of abnormal reaction of the patient, or of later complication, without mention of misadventure at the time of the procedure: Secondary | ICD-10-CM | POA: Diagnosis not present

## 2014-07-30 DIAGNOSIS — Y92239 Unspecified place in hospital as the place of occurrence of the external cause: Secondary | ICD-10-CM | POA: Diagnosis not present

## 2014-07-30 DIAGNOSIS — T8130XA Disruption of wound, unspecified, initial encounter: Secondary | ICD-10-CM | POA: Diagnosis present

## 2014-07-30 DIAGNOSIS — F329 Major depressive disorder, single episode, unspecified: Secondary | ICD-10-CM | POA: Diagnosis not present

## 2014-07-30 DIAGNOSIS — Z79899 Other long term (current) drug therapy: Secondary | ICD-10-CM | POA: Diagnosis not present

## 2014-07-30 DIAGNOSIS — F909 Attention-deficit hyperactivity disorder, unspecified type: Secondary | ICD-10-CM | POA: Diagnosis not present

## 2014-07-30 DIAGNOSIS — S31000A Unspecified open wound of lower back and pelvis without penetration into retroperitoneum, initial encounter: Secondary | ICD-10-CM | POA: Diagnosis present

## 2014-07-30 HISTORY — PX: LUMBAR WOUND DEBRIDEMENT: SHX1988

## 2014-07-30 LAB — CBC
HCT: 40.8 % (ref 39.0–52.0)
Hemoglobin: 14.1 g/dL (ref 13.0–17.0)
MCH: 30.9 pg (ref 26.0–34.0)
MCHC: 34.6 g/dL (ref 30.0–36.0)
MCV: 89.3 fL (ref 78.0–100.0)
PLATELETS: 361 10*3/uL (ref 150–400)
RBC: 4.57 MIL/uL (ref 4.22–5.81)
RDW: 11.9 % (ref 11.5–15.5)
WBC: 6.4 10*3/uL (ref 4.0–10.5)

## 2014-07-30 SURGERY — LUMBAR WOUND DEBRIDEMENT
Anesthesia: General

## 2014-07-30 MED ORDER — FENTANYL CITRATE (PF) 250 MCG/5ML IJ SOLN
INTRAMUSCULAR | Status: AC
  Filled 2014-07-30: qty 5

## 2014-07-30 MED ORDER — SODIUM CHLORIDE 0.9 % IR SOLN
Status: DC | PRN
  Administered 2014-07-30: 13:00:00

## 2014-07-30 MED ORDER — ACETAMINOPHEN 325 MG PO TABS: 650.0000 mg | ORAL_TABLET | ORAL | Status: DC | PRN

## 2014-07-30 MED ORDER — DOXYCYCLINE HYCLATE 100 MG PO TBEC
100.0000 mg | DELAYED_RELEASE_TABLET | Freq: Two times a day (BID) | ORAL | Status: DC
  Administered 2014-07-30 – 2014-07-31 (×2): 100 mg via ORAL
  Filled 2014-07-30 (×3): qty 1

## 2014-07-30 MED ORDER — THROMBIN 5000 UNITS EX SOLR
CUTANEOUS | Status: DC | PRN
  Administered 2014-07-30: 5000 [IU] via TOPICAL

## 2014-07-30 MED ORDER — MIDAZOLAM HCL 2 MG/2ML IJ SOLN
INTRAMUSCULAR | Status: AC
  Filled 2014-07-30: qty 2

## 2014-07-30 MED ORDER — ONDANSETRON HCL 4 MG/2ML IJ SOLN
INTRAMUSCULAR | Status: AC
  Filled 2014-07-30: qty 2

## 2014-07-30 MED ORDER — PROPOFOL 10 MG/ML IV BOLUS
INTRAVENOUS | Status: DC | PRN
  Administered 2014-07-30: 200 mg via INTRAVENOUS

## 2014-07-30 MED ORDER — PROPOFOL 10 MG/ML IV BOLUS
INTRAVENOUS | Status: AC
  Filled 2014-07-30: qty 20

## 2014-07-30 MED ORDER — DEXAMETHASONE 4 MG PO TABS
4.0000 mg | ORAL_TABLET | Freq: Four times a day (QID) | ORAL | Status: DC
  Administered 2014-07-30 – 2014-07-31 (×4): 4 mg via ORAL
  Filled 2014-07-30 (×7): qty 1

## 2014-07-30 MED ORDER — 0.9 % SODIUM CHLORIDE (POUR BTL) OPTIME
TOPICAL | Status: DC | PRN
  Administered 2014-07-30: 1000 mL

## 2014-07-30 MED ORDER — MIDAZOLAM HCL 5 MG/5ML IJ SOLN
INTRAMUSCULAR | Status: DC | PRN
  Administered 2014-07-30: 2 mg via INTRAVENOUS

## 2014-07-30 MED ORDER — DEXAMETHASONE SODIUM PHOSPHATE 4 MG/ML IJ SOLN
4.0000 mg | Freq: Four times a day (QID) | INTRAMUSCULAR | Status: DC
  Filled 2014-07-30 (×4): qty 1

## 2014-07-30 MED ORDER — CLONAZEPAM 0.5 MG PO TABS: 0.2500 mg | ORAL_TABLET | Freq: Two times a day (BID) | ORAL | Status: DC | PRN

## 2014-07-30 MED ORDER — GABAPENTIN 600 MG PO TABS
600.0000 mg | ORAL_TABLET | Freq: Every day | ORAL | Status: DC
  Administered 2014-07-30: 600 mg via ORAL
  Filled 2014-07-30 (×2): qty 1

## 2014-07-30 MED ORDER — DEXAMETHASONE SODIUM PHOSPHATE 4 MG/ML IJ SOLN
INTRAMUSCULAR | Status: AC
  Filled 2014-07-30: qty 2

## 2014-07-30 MED ORDER — OXYCODONE HCL 5 MG/5ML PO SOLN: 5.0000 mg | Freq: Once | ORAL | Status: AC | PRN

## 2014-07-30 MED ORDER — ARTIFICIAL TEARS OP OINT
TOPICAL_OINTMENT | OPHTHALMIC | Status: AC
  Filled 2014-07-30: qty 3.5

## 2014-07-30 MED ORDER — OXYCODONE HCL 5 MG PO TABS
5.0000 mg | ORAL_TABLET | Freq: Once | ORAL | Status: AC | PRN
  Administered 2014-07-30: 5 mg via ORAL

## 2014-07-30 MED ORDER — FENTANYL CITRATE (PF) 100 MCG/2ML IJ SOLN
INTRAMUSCULAR | Status: AC
  Administered 2014-07-30: 50 ug via INTRAVENOUS
  Filled 2014-07-30: qty 2

## 2014-07-30 MED ORDER — CYCLOBENZAPRINE HCL 10 MG PO TABS
10.0000 mg | ORAL_TABLET | Freq: Three times a day (TID) | ORAL | Status: DC | PRN
  Administered 2014-07-30 – 2014-07-31 (×3): 10 mg via ORAL
  Filled 2014-07-30 (×2): qty 1

## 2014-07-30 MED ORDER — MORPHINE SULFATE 2 MG/ML IJ SOLN: 1.0000 mg | INTRAMUSCULAR | Status: DC | PRN

## 2014-07-30 MED ORDER — ROCURONIUM BROMIDE 100 MG/10ML IV SOLN
INTRAVENOUS | Status: DC | PRN
  Administered 2014-07-30: 40 mg via INTRAVENOUS
  Administered 2014-07-30: 10 mg via INTRAVENOUS

## 2014-07-30 MED ORDER — SODIUM CHLORIDE 0.9 % IJ SOLN: 3.0000 mL | INTRAMUSCULAR | Status: DC | PRN

## 2014-07-30 MED ORDER — OXYCODONE HCL 5 MG PO TABS
ORAL_TABLET | ORAL | Status: AC
  Filled 2014-07-30: qty 1

## 2014-07-30 MED ORDER — ACETAMINOPHEN 650 MG RE SUPP: 650.0000 mg | RECTAL | Status: DC | PRN

## 2014-07-30 MED ORDER — CEFAZOLIN SODIUM-DEXTROSE 2-3 GM-% IV SOLR
2.0000 g | INTRAVENOUS | Status: AC
  Administered 2014-07-30: 2 g via INTRAVENOUS
  Filled 2014-07-30: qty 50

## 2014-07-30 MED ORDER — MENTHOL 3 MG MT LOZG: 1.0000 | LOZENGE | OROMUCOSAL | Status: DC | PRN

## 2014-07-30 MED ORDER — LACTATED RINGERS IV SOLN
INTRAVENOUS | Status: DC
  Administered 2014-07-30 (×2): via INTRAVENOUS

## 2014-07-30 MED ORDER — NEOSTIGMINE METHYLSULFATE 10 MG/10ML IV SOLN
INTRAVENOUS | Status: AC
  Filled 2014-07-30: qty 2

## 2014-07-30 MED ORDER — ZOLPIDEM TARTRATE 5 MG PO TABS
10.0000 mg | ORAL_TABLET | Freq: Every evening | ORAL | Status: DC | PRN
  Administered 2014-07-30: 10 mg via ORAL
  Filled 2014-07-30: qty 2

## 2014-07-30 MED ORDER — ARTIFICIAL TEARS OP OINT
TOPICAL_OINTMENT | OPHTHALMIC | Status: DC | PRN
  Administered 2014-07-30: 1 via OPHTHALMIC

## 2014-07-30 MED ORDER — ONDANSETRON HCL 4 MG/2ML IJ SOLN: 4.0000 mg | INTRAMUSCULAR | Status: DC | PRN

## 2014-07-30 MED ORDER — FENTANYL CITRATE (PF) 100 MCG/2ML IJ SOLN
50.0000 ug | Freq: Once | INTRAMUSCULAR | Status: AC
  Administered 2014-07-30: 50 ug via INTRAVENOUS
  Filled 2014-07-30: qty 1

## 2014-07-30 MED ORDER — HYDROMORPHONE HCL 1 MG/ML IJ SOLN
0.2500 mg | INTRAMUSCULAR | Status: DC | PRN
  Administered 2014-07-30 (×4): 0.5 mg via INTRAVENOUS

## 2014-07-30 MED ORDER — ONDANSETRON HCL 4 MG/2ML IJ SOLN
INTRAMUSCULAR | Status: DC | PRN
  Administered 2014-07-30: 4 mg via INTRAVENOUS

## 2014-07-30 MED ORDER — AMPHETAMINE-DEXTROAMPHETAMINE 15 MG PO TABS: 15.0000 mg | ORAL_TABLET | Freq: Every day | ORAL | Status: DC

## 2014-07-30 MED ORDER — NON FORMULARY
Status: DC | PRN
  Administered 2014-07-30: 3 mL via SURGICAL_CAVITY

## 2014-07-30 MED ORDER — LAMOTRIGINE 100 MG PO TABS
100.0000 mg | ORAL_TABLET | Freq: Every day | ORAL | Status: DC
  Administered 2014-07-30: 100 mg via ORAL
  Filled 2014-07-30 (×2): qty 1

## 2014-07-30 MED ORDER — SODIUM CHLORIDE 0.9 % IJ SOLN
3.0000 mL | Freq: Two times a day (BID) | INTRAMUSCULAR | Status: DC
  Administered 2014-07-30: 3 mL via INTRAVENOUS

## 2014-07-30 MED ORDER — GLYCOPYRROLATE 0.2 MG/ML IJ SOLN
INTRAMUSCULAR | Status: AC
  Filled 2014-07-30: qty 4

## 2014-07-30 MED ORDER — LIDOCAINE HCL (CARDIAC) 20 MG/ML IV SOLN
INTRAVENOUS | Status: DC | PRN
  Administered 2014-07-30: 40 mg via INTRAVENOUS
  Administered 2014-07-30: 60 mg via INTRATRACHEAL

## 2014-07-30 MED ORDER — DEXAMETHASONE SODIUM PHOSPHATE 4 MG/ML IJ SOLN
INTRAMUSCULAR | Status: DC | PRN
  Administered 2014-07-30: 8 mg via INTRAVENOUS

## 2014-07-30 MED ORDER — FENTANYL CITRATE (PF) 100 MCG/2ML IJ SOLN
INTRAMUSCULAR | Status: DC | PRN
  Administered 2014-07-30 (×2): 50 ug via INTRAVENOUS
  Administered 2014-07-30: 100 ug via INTRAVENOUS
  Administered 2014-07-30: 50 ug via INTRAVENOUS

## 2014-07-30 MED ORDER — PHENOL 1.4 % MT LIQD: 1.0000 | OROMUCOSAL | Status: DC | PRN

## 2014-07-30 MED ORDER — NEOSTIGMINE METHYLSULFATE 10 MG/10ML IV SOLN
INTRAVENOUS | Status: DC | PRN
  Administered 2014-07-30: 5 mg via INTRAVENOUS

## 2014-07-30 MED ORDER — HEMOSTATIC AGENTS (NO CHARGE) OPTIME
TOPICAL | Status: DC | PRN
  Administered 2014-07-30: 1 via TOPICAL

## 2014-07-30 MED ORDER — PROMETHAZINE HCL 25 MG/ML IJ SOLN
6.2500 mg | INTRAMUSCULAR | Status: DC | PRN
Start: 2014-07-30 — End: 2014-07-30

## 2014-07-30 MED ORDER — POTASSIUM CHLORIDE IN NACL 20-0.9 MEQ/L-% IV SOLN
INTRAVENOUS | Status: DC
  Filled 2014-07-30 (×3): qty 1000

## 2014-07-30 MED ORDER — OXYCODONE-ACETAMINOPHEN 5-325 MG PO TABS
1.0000 | ORAL_TABLET | ORAL | Status: DC | PRN
  Administered 2014-07-30 – 2014-07-31 (×5): 2 via ORAL
  Filled 2014-07-30 (×5): qty 2

## 2014-07-30 MED ORDER — KETOROLAC TROMETHAMINE 30 MG/ML IJ SOLN
30.0000 mg | Freq: Once | INTRAMUSCULAR | Status: AC | PRN
  Administered 2014-07-30: 30 mg via INTRAVENOUS

## 2014-07-30 MED ORDER — CYCLOBENZAPRINE HCL 10 MG PO TABS
ORAL_TABLET | ORAL | Status: AC
  Filled 2014-07-30: qty 1

## 2014-07-30 MED ORDER — HYDROMORPHONE HCL 1 MG/ML IJ SOLN
INTRAMUSCULAR | Status: AC
  Administered 2014-07-30: 0.5 mg via INTRAVENOUS
  Filled 2014-07-30: qty 2

## 2014-07-30 MED ORDER — SODIUM CHLORIDE 0.9 % IV SOLN: 250.0000 mL | INTRAVENOUS | Status: DC

## 2014-07-30 MED ORDER — DIVALPROEX SODIUM ER 500 MG PO TB24
1500.0000 mg | ORAL_TABLET | Freq: Every day | ORAL | Status: DC
  Administered 2014-07-30: 1500 mg via ORAL
  Filled 2014-07-30 (×2): qty 3

## 2014-07-30 MED ORDER — KETOROLAC TROMETHAMINE 30 MG/ML IJ SOLN
INTRAMUSCULAR | Status: AC
  Filled 2014-07-30: qty 1

## 2014-07-30 MED ORDER — CEFAZOLIN SODIUM 1-5 GM-% IV SOLN
1.0000 g | Freq: Three times a day (TID) | INTRAVENOUS | Status: AC
  Administered 2014-07-30 – 2014-07-31 (×2): 1 g via INTRAVENOUS
  Filled 2014-07-30 (×2): qty 50

## 2014-07-30 MED ORDER — GLYCOPYRROLATE 0.2 MG/ML IJ SOLN
INTRAMUSCULAR | Status: DC | PRN
  Administered 2014-07-30: .8 mg via INTRAVENOUS

## 2014-07-30 MED ORDER — LIDOCAINE HCL (CARDIAC) 20 MG/ML IV SOLN
INTRAVENOUS | Status: AC
  Filled 2014-07-30: qty 10

## 2014-07-30 SURGICAL SUPPLY — 57 items
ADH SKN CLS APL DERMABOND .7 (GAUZE/BANDAGES/DRESSINGS) ×1
APL SKNCLS STERI-STRIP NONHPOA (GAUZE/BANDAGES/DRESSINGS) ×1
APL SRG 60D 8 XTD TIP BNDBL (TIP) ×1
BAG DECANTER FOR FLEXI CONT (MISCELLANEOUS) ×4 IMPLANT
BENZOIN TINCTURE PRP APPL 2/3 (GAUZE/BANDAGES/DRESSINGS) ×2 IMPLANT
CANISTER SUCT 3000ML PPV (MISCELLANEOUS) ×2 IMPLANT
DERMABOND ADVANCED (GAUZE/BANDAGES/DRESSINGS) ×1
DERMABOND ADVANCED .7 DNX12 (GAUZE/BANDAGES/DRESSINGS) IMPLANT
DRAPE LAPAROTOMY 100X72X124 (DRAPES) ×2 IMPLANT
DRAPE MICROSCOPE LEICA (MISCELLANEOUS) ×1 IMPLANT
DRAPE POUCH INSTRU U-SHP 10X18 (DRAPES) ×2 IMPLANT
DRSG OPSITE 4X5.5 SM (GAUZE/BANDAGES/DRESSINGS) ×2 IMPLANT
DRSG OPSITE POSTOP 4X6 (GAUZE/BANDAGES/DRESSINGS) ×1 IMPLANT
DRSG TELFA 3X8 NADH (GAUZE/BANDAGES/DRESSINGS) ×2 IMPLANT
DURAPREP 26ML APPLICATOR (WOUND CARE) ×1 IMPLANT
DURAPREP 6ML APPLICATOR 50/CS (WOUND CARE) IMPLANT
DURASEAL APPLICATOR TIP (TIP) ×1 IMPLANT
DURASEAL SPINE SEALANT 3ML (MISCELLANEOUS) ×1 IMPLANT
ELECT REM PT RETURN 9FT ADLT (ELECTROSURGICAL) ×2
ELECTRODE REM PT RTRN 9FT ADLT (ELECTROSURGICAL) ×1 IMPLANT
GAUZE SPONGE 4X4 16PLY XRAY LF (GAUZE/BANDAGES/DRESSINGS) IMPLANT
GLOVE BIO SURGEON STRL SZ8 (GLOVE) ×2 IMPLANT
GLOVE INDICATOR 6.5 STRL GRN (GLOVE) ×1 IMPLANT
GLOVE INDICATOR 7.0 STRL GRN (GLOVE) ×2 IMPLANT
GLOVE INDICATOR 7.5 STRL GRN (GLOVE) ×1 IMPLANT
GLOVE SURG SS PI 6.5 STRL IVOR (GLOVE) ×1 IMPLANT
GLOVE SURG SS PI 7.0 STRL IVOR (GLOVE) ×2 IMPLANT
GOWN STRL REUS W/ TWL LRG LVL3 (GOWN DISPOSABLE) IMPLANT
GOWN STRL REUS W/ TWL XL LVL3 (GOWN DISPOSABLE) IMPLANT
GOWN STRL REUS W/TWL 2XL LVL3 (GOWN DISPOSABLE) ×2 IMPLANT
GOWN STRL REUS W/TWL LRG LVL3 (GOWN DISPOSABLE)
GOWN STRL REUS W/TWL XL LVL3 (GOWN DISPOSABLE)
KIT BASIN OR (CUSTOM PROCEDURE TRAY) ×2 IMPLANT
KIT ROOM TURNOVER OR (KITS) ×2 IMPLANT
NDL HYPO 18GX1.5 BLUNT FILL (NEEDLE) IMPLANT
NDL HYPO 25X1 1.5 SAFETY (NEEDLE) ×1 IMPLANT
NDL SPNL 20GX3.5 QUINCKE YW (NEEDLE) IMPLANT
NEEDLE HYPO 18GX1.5 BLUNT FILL (NEEDLE) IMPLANT
NEEDLE HYPO 25X1 1.5 SAFETY (NEEDLE) ×2 IMPLANT
NEEDLE SPNL 20GX3.5 QUINCKE YW (NEEDLE) IMPLANT
NS IRRIG 1000ML POUR BTL (IV SOLUTION) ×2 IMPLANT
PACK LAMINECTOMY NEURO (CUSTOM PROCEDURE TRAY) ×2 IMPLANT
PAD ARMBOARD 7.5X6 YLW CONV (MISCELLANEOUS) ×6 IMPLANT
PAD DRESSING TELFA 3X8 NADH (GAUZE/BANDAGES/DRESSINGS) ×1 IMPLANT
RUBBERBAND STERILE (MISCELLANEOUS) ×2 IMPLANT
STRIP CLOSURE SKIN 1/2X4 (GAUZE/BANDAGES/DRESSINGS) ×2 IMPLANT
SUT VIC AB 0 CT1 18XCR BRD8 (SUTURE) ×1 IMPLANT
SUT VIC AB 0 CT1 8-18 (SUTURE) ×2
SUT VIC AB 2-0 CP2 18 (SUTURE) ×3 IMPLANT
SUT VIC AB 3-0 SH 8-18 (SUTURE) ×2 IMPLANT
SWAB COLLECTION DEVICE MRSA (MISCELLANEOUS) ×2 IMPLANT
SYR 20ML ECCENTRIC (SYRINGE) ×2 IMPLANT
SYR 3ML LL SCALE MARK (SYRINGE) IMPLANT
TOWEL OR 17X24 6PK STRL BLUE (TOWEL DISPOSABLE) ×3 IMPLANT
TOWEL OR 17X26 10 PK STRL BLUE (TOWEL DISPOSABLE) ×2 IMPLANT
TUBE ANAEROBIC SPECIMEN COL (MISCELLANEOUS) ×2 IMPLANT
WATER STERILE IRR 1000ML POUR (IV SOLUTION) ×2 IMPLANT

## 2014-07-30 NOTE — Anesthesia Postprocedure Evaluation (Signed)
  Anesthesia Post-op Note  Patient: Shawn Cervantes  Procedure(s) Performed: Procedure(s): LUMBAR WOUND IRRIGATION AND DEBRIDEMENT,         (N/A)  Patient Location: PACU  Anesthesia Type:General  Level of Consciousness: awake and alert   Airway and Oxygen Therapy: Patient Spontanous Breathing  Post-op Pain: mild  Post-op Assessment: Post-op Vital signs reviewed  Post-op Vital Signs: Reviewed  Last Vitals:  Filed Vitals:   07/30/14 1641  BP:   Pulse: 92  Temp: 36.8 C  Resp: 22    Complications: No apparent anesthesia complications

## 2014-07-30 NOTE — H&P (Signed)
Subjective: Patient is a 44 y.o. male admitted for lumbar wound exploration. Onset of symptoms was a few  Days ago, gradually worsening since that time.  The pain is rated moderate, and is located at the across the lower back and radiates to RLE. The pain is described as aching and occurs intermittently. The symptoms have been progressive. Symptoms are exacerbated by exercise. He is 2 weeks status post TLIF, and noted some drainage from his wound starting over the weekend.   Past Medical History  Diagnosis Date  . Complication of anesthesia   . PONV (postoperative nausea and vomiting)   . Anxiety   . Mental disorder     Bipolar  . Depression     bi polar  . ADHD (attention deficit hyperactivity disorder)   . Head injury     as a child ;had stitches in head  . Constipation   . Testosterone deficiency   . Arthritis     knee and back    Past Surgical History  Procedure Laterality Date  . Hand surgery Left     tendon repai x2  . Microdiscectomy lumbar      L4-5  . Wrist surgery Right     reconstution  . Wrist fusion Right 1998  . Wrist arthroscopy    . Vasectomy    . 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 Microdiscectomy lumbar      L4-5  . Transforaminal lumbar interbody fusion (tlif) with pedicle screw fixation 1 level N/A 07/17/2014    Procedure: Transforaminal Lumbar Interbody Fusion Lumbar four-five with pedicle screws  ;  Surgeon: Tia Alertavid S Markovic, MD;  Location: [facility];  Service: Neurosurgery;  Laterality: N/A;    Prior to Admission medications   Medication Sig Start Date End Date Taking? Authorizing Provider  amphetamine-dextroamphetamine (ADDERALL) 15 MG tablet Take 15 mg by mouth daily.  09/26/12  Yes Historical Provider, MD  clonazePAM (KLONOPIN) 0.5 MG tablet Take 0.25 mg by mouth as  needed for anxiety.  08/21/12  Yes Historical Provider, MD  cyclobenzaprine (FLEXERIL) 10 MG tablet Take 1 tablet (10 mg total) by mouth 3 (three) times daily as needed for muscle spasms. 07/18/14  Yes Tia Alertavid S Shone, MD  divalproex (DEPAKOTE ER) 500 MG 24 hr tablet Take 1,500 mg by mouth at bedtime.   Yes Historical Provider, MD  doxycycline (DORYX) 100 MG EC tablet Take 100 mg by mouth 2 (two) times daily.   Yes Historical Provider, MD  fish oil-omega-3 fatty acids 1000 MG capsule Take 2 g by mouth daily.   Yes Historical Provider, MD  gabapentin (NEURONTIN) 600 MG tablet Take 600 mg by mouth at bedtime.   Yes Historical Provider, MD  lamoTRIgine (LAMICTAL) 100 MG tablet Take 100 mg by mouth daily.    Yes Historical Provider, MD  oxyCODONE-acetaminophen (ROXICET) 5-325 MG per tablet Take 1-2 tablets by mouth every 4 (four) hours as needed for moderate pain. 07/18/14  Yes Tia Alertavid S Capri, MD  Testosterone 10 MG/ACT (2%) GEL APPLY 3 PUMPS ON EACH SIDE AS DIRECTED 07/08/14  Yes Reather LittlerAjay Kumar, MD  zolpidem (AMBIEN) 10 MG tablet Take 10 mg by mouth at bedtime as needed for sleep.   Yes Historical Provider, MD   No Known Allergies  History  Substance Use Topics  . Smoking status: Former Smoker --  0.00 packs/day    Types: Cigars    Quit date: 10/14/2013  . Smokeless tobacco: Never Used  . Alcohol Use: No    History reviewed. No pertinent family history.   Review of Systems  Positive ROS: neg  All other systems have been reviewed and were otherwise negative with the exception of those mentioned in the HPI and as above.  Objective: Vital signs in last 24 hours: Temp:  [98.4 F (36.9 C)] 98.4 F (36.9 C) (05/18 1004) Pulse Rate:  [82] 82 (05/18 1004) Resp:  [18] 18 (05/18 1004) BP: (104)/(65) 104/65 mmHg (05/18 1004) SpO2:  [98 %] 98 % (05/18 1004) Weight:  [239 lb (108.41 kg)] 239 lb (108.41 kg) (05/18 1004)  General Appearance: Alert, cooperative, no distress, appears stated age Head:  Normocephalic, without obvious abnormality, atraumatic Eyes: PERRL, conjunctiva/corneas clear, EOM's intact    Neck: Supple, symmetrical, trachea midline Back: Symmetric, no curvature, ROM normal, no CVA tenderness Lungs:  respirations unlabored Heart: Regular rate and rhythm Abdomen: Soft, non-tender Extremities: Extremities normal, atraumatic, no cyanosis or edema Pulses: 2+ and symmetric all extremities Skin: Skin color, texture, turgor normal, no rashes or lesions  NEUROLOGIC:   Mental status: Alert and oriented x4,  no aphasia, good attention span, fund of knowledge, and memory Motor Exam - grossly normal Sensory Exam - grossly normal Reflexes: 1+ Coordination - grossly normal Gait - grossly normal Balance - grossly normal Cranial Nerves: I: smell Not tested  II: visual acuity  OS: nl    OD: nl  II: visual fields Full to confrontation  II: pupils Equal, round, reactive to light  III,VII: ptosis None  III,IV,VI: extraocular muscles  Full ROM  V: mastication Normal  V: facial light touch sensation  Normal  V,VII: corneal reflex  Present  VII: facial muscle function - upper  Normal  VII: facial muscle function - lower Normal  VIII: hearing Not tested  IX: soft palate elevation  Normal  IX,X: gag reflex Present  XI: trapezius strength  5/5  XI: sternocleidomastoid strength 5/5  XI: neck flexion strength  5/5  XII: tongue strength  Normal    Data Review Lab Results  Component Value Date   WBC 6.4 07/30/2014   HGB 14.1 07/30/2014   HCT 40.8 07/30/2014   MCV 89.3 07/30/2014   PLT 361 07/30/2014   Lab Results  Component Value Date   NA 138 07/15/2014   K 3.9 07/15/2014   CL 102 07/15/2014   CO2 26 07/15/2014   BUN 7 07/15/2014   CREATININE 0.91 07/15/2014   GLUCOSE 85 07/15/2014   Lab Results  Component Value Date   INR 1.06 07/15/2014    Assessment/Plan: Patient admitted for lumbar wound exploration with irrigation and debridement and to rule out CSF  leak or other cause of drainage from the wound.. Patient has failed a reasonable attempt at conservative therapy.  I explained the condition and procedure to the patient and answered any questions.  Patient wishes to proceed with procedure as planned. Understands risks/ benefits and typical outcomes of procedure.   Ganoe,DAVID S 07/30/2014 12:14 PM

## 2014-07-30 NOTE — Progress Notes (Signed)
Spoke with Dr. Marcene Duosobert Fitzgerald about labs being done on 07/15/14 with normal results to see if we need to repeat.  He stated to get a CBC and that should be all we need.

## 2014-07-30 NOTE — Op Note (Signed)
07/30/2014  2:56 PM  PATIENT:  Shawn Cervantes  44 y.o. male  PRE-OPERATIVE DIAGNOSIS:  Lumbar wound drainage  POST-OPERATIVE DIAGNOSIS:  Lumbar wound seroma  PROCEDURE:  Lumbar wound exploration with irrigation and debridement of wound, exploration utilizing microscopic dissection to rule out CSF leak, primary closure  SURGEON:  Marikay Alaravid Hart, MD  ASSISTANTS: None  ANESTHESIA:   General  EBL: 75 ml  Total I/O In: 1000 [I.V.:1000] Out: 75 [Blood:75]  BLOOD ADMINISTERED:none  DRAINS: None   SPECIMEN:  No Specimen  INDICATION FOR PROCEDURE: This patient underwent a TLIF 2 weeks ago. He presented over the weekend with drainage from the top part of his wound looks serosanguineous. He had intermittent mild headaches. No fevers or chills. Staples did not stop the drainage. Therefore he presents today for lumbar wound exploration and reclosure. Patient understood the risks, benefits, and alternatives and potential outcomes and wished to proceed.  PROCEDURE DETAILS: The patient was taken to the operating room and after induction of adequate generalized endotracheal anesthesia, the patient was rolled into the prone position on chest rolls and all pressure points were padded. The lumbar region was cleaned and then prepped with DuraPrep and draped in the usual sterile fashion. 5 cc of local anesthesia was injected and then a dorsal midline incision was made through the old incision and carried down to the lumbo sacral fascia. There was an immediate release of a small amount of serosanguineous drainage. The fascia was opened and and I found a moderate amount of serous fluid in the epidural space. I cultured the wound. I spent considerable time examining the dura, especially under the bony edges to make sure there was no evidence of CSF leak. We performed 3 Valsalva maneuvers up to 40 without evidence of CSF leakage. The wound looked grossly clean and not contaminated. The operating microscope was  draped and brought into the field for further evaluation of the dura to make sure there was no CSF leak. I was able to dissect through the lateral recess on the patient's right-hand side where he had previous surgery. I found no obvious CSF leak. We looked at his previous 2 surgeries performed by another surgeon and it appears that Prolene and DuraSeal was used suggesting that he had a previous CSF leak. However, today I could not find evidence of CSF. I irrigated with saline solution containing bacitracin. Achieved hemostasis with bipolar cautery, lined the dura with Gelfoam, and then closed the fascia with 0 Vicryl. I closed the subcutaneous tissues with 2-0 Vicryl.  The skin edges were ellipsed out to get good healthy bleeding edges and was then the subcuticular tissue was closed with 3-0 Vicryl and the skin was closed with benzoin, Dermabond and Steri-Strips. The drapes were removed, a sterile dressing was applied. The patient was awakened from general anesthesia and transferred to the recovery room in stable condition. At the end of the procedure all sponge, needle and instrument counts were correct.    PLAN OF CARE: Admit for overnight observation  PATIENT DISPOSITION:  PACU - hemodynamically stable.   Delay start of Pharmacological VTE agent (>24hrs) due to surgical blood loss or risk of bleeding:  yes

## 2014-07-30 NOTE — Anesthesia Procedure Notes (Signed)
Procedure Name: Intubation Date/Time: 07/30/2014 12:55 PM Performed by: [place] Pink Pre-anesthesia Checklist: Patient identified, Emergency Drugs available, Suction available, Patient being monitored and Timeout performed Patient Re-evaluated:Patient Re-evaluated prior to inductionOxygen Delivery Method: Circle system utilized Preoxygenation: Pre-oxygenation with 100% oxygen Intubation Type: IV induction Ventilation: Mask ventilation without difficulty and Oral airway inserted - appropriate to patient size Laryngoscope Size: Mac and 4 Grade View: Grade I Tube type: Oral Tube size: 7.5 mm Number of attempts: 1 Airway Equipment and Method: Stylet and LTA kit utilized Placement Confirmation: ETT inserted through vocal cords under direct vision,  positive ETCO2 and breath sounds checked- equal and bilateral Secured at: 22 cm Tube secured with: Tape Dental Injury: Teeth and Oropharynx as per pre-operative assessment

## 2014-07-30 NOTE — Anesthesia Preprocedure Evaluation (Addendum)
Anesthesia Evaluation  Patient identified by MRN, date of birth, ID band Patient awake    Reviewed: Allergy & Precautions, NPO status , Patient's Chart, lab work & pertinent test results  Airway Mallampati: I  TM Distance: >3 FB Neck ROM: Full    Dental   Pulmonary former smoker,  breath sounds clear to auscultation  Pulmonary exam normal       Cardiovascular negative cardio ROS  Rhythm:Regular Rate:Normal     Neuro/Psych Anxiety Depression    GI/Hepatic negative GI ROS, Neg liver ROS,   Endo/Other  negative endocrine ROS  Renal/GU negative Renal ROS     Musculoskeletal  (+) Arthritis -,   Abdominal   Peds  (+) ADHD Hematology negative hematology ROS (+)   Anesthesia Other Findings   Reproductive/Obstetrics                           Lab Results  Component Value Date   WBC 6.4 07/30/2014   HGB 14.1 07/30/2014   HCT 40.8 07/30/2014   MCV 89.3 07/30/2014   PLT 361 07/30/2014   Lab Results  Component Value Date   CREATININE 0.91 07/15/2014   BUN 7 07/15/2014   NA 138 07/15/2014   K 3.9 07/15/2014   CL 102 07/15/2014   CO2 26 07/15/2014    Anesthesia Physical  Anesthesia Plan  ASA: II  Anesthesia Plan: General   Post-op Pain Management:    Induction: Intravenous  Airway Management Planned: Oral ETT  Additional Equipment:   Intra-op Plan:   Post-operative Plan: Extubation in OR  Informed Consent: I have reviewed the patients History and Physical, chart, labs and discussed the procedure including the risks, benefits and alternatives for the proposed anesthesia with the patient or authorized representative who has indicated his/her understanding and acceptance.     Plan Discussed with: CRNA, Anesthesiologist and Surgeon  Anesthesia Plan Comments:         Anesthesia Quick Evaluation

## 2014-07-30 NOTE — Transfer of Care (Signed)
Immediate Anesthesia Transfer of Care Note  Patient: Shawn Cervantes  Procedure(s) Performed: Procedure(s): LUMBAR WOUND IRRIGATION AND DEBRIDEMENT,         (N/A)  Patient Location: PACU  Anesthesia Type:General  Level of Consciousness: awake, alert  and oriented  Airway & Oxygen Therapy: Patient Spontanous Breathing and Patient connected to nasal cannula oxygen  Post-op Assessment: Report given to RN and Post -op Vital signs reviewed and stable  Post vital signs: Reviewed and stable  Last Vitals:  Filed Vitals:   07/30/14 1501  BP: 128/78  Pulse:   Temp:   Resp:     Complications: No apparent anesthesia complications

## 2014-07-31 ENCOUNTER — Encounter (HOSPITAL_COMMUNITY): Payer: Self-pay | Admitting: Neurological Surgery

## 2014-07-31 DIAGNOSIS — G9751 Postprocedural hemorrhage and hematoma of a nervous system organ or structure following a nervous system procedure: Secondary | ICD-10-CM | POA: Diagnosis not present

## 2014-07-31 MED ORDER — OXYCODONE-ACETAMINOPHEN 5-325 MG PO TABS: 1.0000 | ORAL_TABLET | ORAL | Status: DC | PRN

## 2014-07-31 NOTE — Progress Notes (Signed)
Patient alert and oriented, mae's well, voiding adequate amount of urine, swallowing without difficulty, no c/o pain. Patient discharged home with family. Script and discharged instructions given to patient. Patient and family stated understanding of d/c instructions given and has an appointment with MD. 

## 2014-07-31 NOTE — Discharge Summary (Signed)
Physician Discharge Summary  Patient ID: Shawn Cervantes MRN: [id number] DOB/AGE: 10/14/1970 44 y.o.  Admit date: 07/30/2014 Discharge date: 07/31/2014  Admission Diagnoses: Wound seroma   Discharge Diagnoses: Same   Discharged Condition: good  Hospital Course: The patient was admitted on 07/30/2014 and taken to the operating room where the patient underwent irrigation and debridement of lumbar wound. I found no evidence of CSF leak or obvious infection at the time of surgery. The patient tolerated the procedure well and was taken to the recovery room and then to the floor in stable condition. The hospital course was routine. There were no complications. The wound remained clean dry and intact. Pt had appropriate back soreness. No complaints of leg pain or new N/T/W. The patient remained afebrile with stable vital signs, and tolerated a regular diet. The patient continued to increase activities, and pain was well controlled with oral pain medications.   Consults: None  Significant Diagnostic Studies:  Results for orders placed or performed during the hospital encounter of 07/30/14  CBC  Result Value Ref Range   WBC 6.4 4.0 - 10.5 K/uL   RBC 4.57 4.22 - 5.81 MIL/uL   Hemoglobin 14.1 13.0 - 17.0 g/dL   HCT 40.9 81.1 - 91.4 %   MCV 89.3 78.0 - 100.0 fL   MCH 30.9 26.0 - 34.0 pg   MCHC 34.6 30.0 - 36.0 g/dL   RDW 78.2 95.6 - 21.3 %   Platelets 361 150 - 400 K/uL    Chest 2 View  07/15/2014   CLINICAL DATA:  Preop for lumbar spine surgery.  EXAM: CHEST - 2 VIEW  COMPARISON:  Two-view chest 03/29/2013  FINDINGS: The heart size and mediastinal contours are within normal limits. Both lungs are clear. The visualized skeletal structures are unremarkable.  IMPRESSION: Negative two view chest x-ray   Electronically Signed   By: Marin Roberts M.D.   On: 07/15/2014 16:57   Dg Lumbar Spine 2-3 Views  07/17/2014   CLINICAL DATA:  L4-5 MAS TLIF  EXAM: DG C-ARM 61-120 MIN; LUMBAR SPINE - 2-3 VIEW   COMPARISON:  06/05/2014  FLUOROSCOPY TIME:  Radiation Exposure Index (as provided by the fluoroscopic device): Not available  If the device does not provide the exposure index:  Fluoroscopy Time:  1 minutes 22 seconds  Number of Acquired Images:  2  FINDINGS: Pedicle screws are noted at L4-5 with posterior fixation as well as interbody fusion at this level. The numbering nomenclature is similar to that used on prior MRI examination.  IMPRESSION: Interbody fusion at L4-5.   Electronically Signed   By: Alcide Clever M.D.   On: 07/17/2014 10:15   Dg C-arm 1-60 Min  07/17/2014   CLINICAL DATA:  L4-5 MAS TLIF  EXAM: DG C-ARM 61-120 MIN; LUMBAR SPINE - 2-3 VIEW  COMPARISON:  06/05/2014  FLUOROSCOPY TIME:  Radiation Exposure Index (as provided by the fluoroscopic device): Not available  If the device does not provide the exposure index:  Fluoroscopy Time:  1 minutes 22 seconds  Number of Acquired Images:  2  FINDINGS: Pedicle screws are noted at L4-5 with posterior fixation as well as interbody fusion at this level. The numbering nomenclature is similar to that used on prior MRI examination.  IMPRESSION: Interbody fusion at L4-5.   Electronically Signed   By: Alcide Clever M.D.   On: 07/17/2014 10:15    Antibiotics:  Anti-infectives    Start     Dose/Rate Route Frequency Ordered Stop  07/30/14 2200  doxycycline (DORYX) EC tablet 100 mg     100 mg Oral 2 times daily 07/30/14 1715     07/30/14 2100  ceFAZolin (ANCEF) IVPB 1 g/50 mL premix     1 g 100 mL/hr over 30 Minutes Intravenous Every 8 hours 07/30/14 1715 07/31/14 0407   07/30/14 1230  bacitracin 50,000 Units in sodium chloride irrigation 0.9 % 500 mL irrigation  Status:  Discontinued       As needed 07/30/14 1408 07/30/14 1457   07/30/14 1019  ceFAZolin (ANCEF) IVPB 2 g/50 mL premix     2 g 100 mL/hr over 30 Minutes Intravenous On call to O.R. 07/30/14 1019 07/30/14 1305      Discharge Exam: Blood pressure 116/57, pulse 101, temperature 98.2 F  (36.8 C), temperature source Oral, resp. rate 18, height 6\' 1"  (1.854 m), weight 239 lb (108.41 kg), SpO2 94 %. Neurologic: Grossly normal Dressing dry  Discharge Medications:     Medication List    TAKE these medications        amphetamine-dextroamphetamine 15 MG tablet  Commonly known as:  ADDERALL  Take 15 mg by mouth daily.     clonazePAM 0.5 MG tablet  Commonly known as:  KLONOPIN  Take 0.25 mg by mouth as needed for anxiety.     cyclobenzaprine 10 MG tablet  Commonly known as:  FLEXERIL  Take 1 tablet (10 mg total) by mouth 3 (three) times daily as needed for muscle spasms.     divalproex 500 MG 24 hr tablet  Commonly known as:  DEPAKOTE ER  Take 1,500 mg by mouth at bedtime.     doxycycline 100 MG EC tablet  Commonly known as:  DORYX  Take 100 mg by mouth 2 (two) times daily.     fish oil-omega-3 fatty acids 1000 MG capsule  Take 2 g by mouth daily.     gabapentin 600 MG tablet  Commonly known as:  NEURONTIN  Take 600 mg by mouth at bedtime.     lamoTRIgine 100 MG tablet  Commonly known as:  LAMICTAL  Take 100 mg by mouth daily.     oxyCODONE-acetaminophen 5-325 MG per tablet  Commonly known as:  ROXICET  Take 1-2 tablets by mouth every 4 (four) hours as needed for moderate pain.     Testosterone 10 MG/ACT (2%) Gel  APPLY 3 PUMPS ON EACH SIDE AS DIRECTED     zolpidem 10 MG tablet  Commonly known as:  AMBIEN  Take 10 mg by mouth at bedtime as needed for sleep.        Disposition: Home   Final Dx: Irrigation and debridement of lumbar wound with removal of seroma      Discharge Instructions     Remove dressing in 72 hours    Complete by:  As directed      Call MD for:  difficulty breathing, headache or visual disturbances    Complete by:  As directed      Call MD for:  persistant nausea and vomiting    Complete by:  As directed      Call MD for:  redness, tenderness, or signs of infection (pain, swelling, redness, odor or green/yellow discharge  around incision site)    Complete by:  As directed      Call MD for:  severe uncontrolled pain    Complete by:  As directed      Call MD for:  temperature >100.4  Complete by:  As directed      Diet - low sodium heart healthy    Complete by:  As directed      Discharge instructions    Complete by:  As directed   No driving, no lifting, no strenuous activity     Increase activity slowly    Complete by:  As directed            Follow-up Information    Follow up with Merlin,Zakari Couchman S, MD. Schedule an appointment as soon as possible for a visit in 2 weeks.   Specialty:  Neurosurgery   Contact information:   [address] [place] [address] [phone]        Signed: Tia AlertJONES,Kristoph Sattler S 07/31/2014, 8:40 AM

## 2014-08-01 ENCOUNTER — Encounter (HOSPITAL_COMMUNITY): Payer: Self-pay | Admitting: Neurological Surgery

## 2014-08-02 LAB — WOUND CULTURE
Culture: NO GROWTH
GRAM STAIN: NONE SEEN

## 2014-08-04 LAB — ANAEROBIC CULTURE: Gram Stain: NONE SEEN

## 2014-08-04 MED FILL — Doxycycline Hyclate Tab 100 MG: ORAL | Qty: 1 | Status: AC

## 2014-10-07 ENCOUNTER — Other Ambulatory Visit: Payer: Self-pay | Admitting: Family Medicine

## 2014-10-07 DIAGNOSIS — R221 Localized swelling, mass and lump, neck: Secondary | ICD-10-CM

## 2014-10-10 ENCOUNTER — Ambulatory Visit
Admission: RE | Admit: 2014-10-10 | Discharge: 2014-10-10 | Disposition: A | Discharge status: Home / Self Care | Payer: BC Managed Care – PPO | Source: Ambulatory Visit | Attending: Family Medicine | Admitting: Family Medicine

## 2014-10-10 DIAGNOSIS — R221 Localized swelling, mass and lump, neck: Secondary | ICD-10-CM

## 2014-10-15 ENCOUNTER — Other Ambulatory Visit: Payer: Self-pay | Admitting: Family Medicine

## 2014-10-15 DIAGNOSIS — R221 Localized swelling, mass and lump, neck: Secondary | ICD-10-CM

## 2014-10-17 ENCOUNTER — Ambulatory Visit
Admission: RE | Admit: 2014-10-17 | Discharge: 2014-10-17 | Disposition: A | Discharge status: Home / Self Care | Payer: BC Managed Care – PPO | Source: Ambulatory Visit | Attending: Family Medicine | Admitting: Family Medicine

## 2014-10-17 DIAGNOSIS — R221 Localized swelling, mass and lump, neck: Secondary | ICD-10-CM

## 2014-10-17 MED ORDER — IOPAMIDOL (ISOVUE-300) INJECTION 61%
75.0000 mL | Freq: Once | INTRAVENOUS | Status: AC | PRN
  Administered 2014-10-17: 75 mL via INTRAVENOUS

## 2015-06-16 ENCOUNTER — Other Ambulatory Visit: Payer: Self-pay | Admitting: Neurological Surgery

## 2015-06-16 DIAGNOSIS — M961 Postlaminectomy syndrome, not elsewhere classified: Secondary | ICD-10-CM

## 2015-06-22 ENCOUNTER — Ambulatory Visit
Admission: RE | Admit: 2015-06-22 | Discharge: 2015-06-22 | Disposition: A | Discharge status: Home / Self Care | Payer: BC Managed Care – PPO | Source: Ambulatory Visit | Attending: Neurological Surgery | Admitting: Neurological Surgery

## 2015-06-22 DIAGNOSIS — M961 Postlaminectomy syndrome, not elsewhere classified: Secondary | ICD-10-CM

## 2015-06-23 ENCOUNTER — Other Ambulatory Visit: Payer: BC Managed Care – PPO

## 2015-07-20 ENCOUNTER — Encounter (HOSPITAL_COMMUNITY): Payer: Self-pay | Admitting: Emergency Medicine

## 2015-07-20 DIAGNOSIS — Z87891 Personal history of nicotine dependence: Secondary | ICD-10-CM | POA: Insufficient documentation

## 2015-07-20 DIAGNOSIS — F919 Conduct disorder, unspecified: Secondary | ICD-10-CM | POA: Diagnosis not present

## 2015-07-20 DIAGNOSIS — F419 Anxiety disorder, unspecified: Secondary | ICD-10-CM | POA: Insufficient documentation

## 2015-07-20 DIAGNOSIS — F3163 Bipolar disorder, current episode mixed, severe, without psychotic features: Secondary | ICD-10-CM | POA: Diagnosis not present

## 2015-07-20 DIAGNOSIS — Z87828 Personal history of other (healed) physical injury and trauma: Secondary | ICD-10-CM | POA: Insufficient documentation

## 2015-07-20 DIAGNOSIS — Z79899 Other long term (current) drug therapy: Secondary | ICD-10-CM | POA: Diagnosis not present

## 2015-07-20 DIAGNOSIS — R45851 Suicidal ideations: Secondary | ICD-10-CM | POA: Diagnosis present

## 2015-07-20 LAB — COMPREHENSIVE METABOLIC PANEL
ALT: 21 U/L (ref 17–63)
AST: 18 U/L (ref 15–41)
Albumin: 4 g/dL (ref 3.5–5.0)
Alkaline Phosphatase: 40 U/L (ref 38–126)
Anion gap: 12 (ref 5–15)
BUN: 8 mg/dL (ref 6–20)
CHLORIDE: 104 mmol/L (ref 101–111)
CO2: 23 mmol/L (ref 22–32)
CREATININE: 0.89 mg/dL (ref 0.61–1.24)
Calcium: 9.3 mg/dL (ref 8.9–10.3)
GFR calc non Af Amer: >60 mL/min (ref >60)
Glucose, Bld: 92 mg/dL (ref 65–99)
POTASSIUM: 3.6 mmol/L (ref 3.5–5.1)
SODIUM: 139 mmol/L (ref 135–145)
Total Bilirubin: 0.8 mg/dL (ref 0.3–1.2)
Total Protein: 6.7 g/dL (ref 6.5–8.1)

## 2015-07-20 LAB — ACETAMINOPHEN LEVEL

## 2015-07-20 LAB — SALICYLATE LEVEL

## 2015-07-20 LAB — RAPID URINE DRUG SCREEN, HOSP PERFORMED
AMPHETAMINES: NOT DETECTED
BENZODIAZEPINES: NOT DETECTED
Barbiturates: NOT DETECTED
COCAINE: NOT DETECTED
OPIATES: NOT DETECTED
TETRAHYDROCANNABINOL: NOT DETECTED

## 2015-07-20 LAB — CBC
HCT: 44.7 % (ref 39.0–52.0)
HEMOGLOBIN: 15.7 g/dL (ref 13.0–17.0)
MCH: 30.7 pg (ref 26.0–34.0)
MCHC: 35.1 g/dL (ref 30.0–36.0)
MCV: 87.5 fL (ref 78.0–100.0)
PLATELETS: 242 10*3/uL (ref 150–400)
RBC: 5.11 MIL/uL (ref 4.22–5.81)
RDW: 12.3 % (ref 11.5–15.5)
WBC: 6.1 10*3/uL (ref 4.0–10.5)

## 2015-07-20 LAB — ETHANOL: Alcohol, Ethyl (B): <5 mg/dL (ref <5)

## 2015-07-20 MED ORDER — LORAZEPAM 1 MG PO TABS
1.0000 mg | ORAL_TABLET | Freq: Once | ORAL | Status: AC
  Administered 2015-07-20: 1 mg via ORAL
  Filled 2015-07-20: qty 1

## 2015-07-20 NOTE — ED Notes (Signed)
Security wanded pt. at triage . Press photographerCharge nurse and staffing notified for pt.'s sitter. Purple scrubs given to pt.

## 2015-07-20 NOTE — ED Notes (Signed)
Ativan given per pt.'s family request for pt.'s anxiety/pacing .

## 2015-07-20 NOTE — ED Notes (Signed)
Pt here for worsening depression. Pt wife at bedside reporting that pt has hx of Lumbar fusion surgery approx 1 year ago and reports that as pain from surgery has gotten worse, so has depression. Pt reports "I am dangerous" Pt denies any specific plan for harming self or others, but endorses ideations. Pt with eyes closed and fists clenched at triage.

## 2015-07-20 NOTE — ED Notes (Signed)
Staffing aware of need for sitter 

## 2015-07-21 ENCOUNTER — Encounter (HOSPITAL_COMMUNITY): Payer: Self-pay | Admitting: *Deleted

## 2015-07-21 ENCOUNTER — Inpatient Hospital Stay (HOSPITAL_COMMUNITY)
Admission: AD | Admit: 2015-07-21 | Discharge: 2015-07-24 | DRG: 885 | Disposition: A | Discharge status: Home / Self Care | Payer: BC Managed Care – PPO | Source: Intra-hospital | Attending: Psychiatry | Admitting: Psychiatry

## 2015-07-21 ENCOUNTER — Emergency Department (HOSPITAL_COMMUNITY)
Admission: EM | Admit: 2015-07-21 | Discharge: 2015-07-21 | Disposition: A | Discharge status: Other Acute Inpatient Hospital | Payer: BC Managed Care – PPO | Attending: Emergency Medicine | Admitting: Emergency Medicine

## 2015-07-21 DIAGNOSIS — R4589 Other symptoms and signs involving emotional state: Secondary | ICD-10-CM

## 2015-07-21 DIAGNOSIS — F3163 Bipolar disorder, current episode mixed, severe, without psychotic features: Secondary | ICD-10-CM

## 2015-07-21 DIAGNOSIS — Z87891 Personal history of nicotine dependence: Secondary | ICD-10-CM

## 2015-07-21 DIAGNOSIS — F332 Major depressive disorder, recurrent severe without psychotic features: Secondary | ICD-10-CM | POA: Diagnosis not present

## 2015-07-21 DIAGNOSIS — F3160 Bipolar disorder, current episode mixed, unspecified: Principal | ICD-10-CM | POA: Diagnosis present

## 2015-07-21 DIAGNOSIS — G8929 Other chronic pain: Secondary | ICD-10-CM | POA: Diagnosis present

## 2015-07-21 DIAGNOSIS — F329 Major depressive disorder, single episode, unspecified: Secondary | ICD-10-CM | POA: Diagnosis present

## 2015-07-21 HISTORY — DX: Bipolar disorder, unspecified: F31.9

## 2015-07-21 MED ORDER — CYCLOBENZAPRINE HCL 10 MG PO TABS
10.0000 mg | ORAL_TABLET | Freq: Three times a day (TID) | ORAL | Status: DC
  Administered 2015-07-21 – 2015-07-22 (×2): 10 mg via ORAL
  Filled 2015-07-21 (×5): qty 1

## 2015-07-21 MED ORDER — ALUM & MAG HYDROXIDE-SIMETH 200-200-20 MG/5ML PO SUSP: 30.0000 mL | ORAL | Status: DC | PRN

## 2015-07-21 MED ORDER — LAMOTRIGINE 100 MG PO TABS
100.0000 mg | ORAL_TABLET | Freq: Two times a day (BID) | ORAL | Status: DC
  Administered 2015-07-21: 100 mg via ORAL
  Filled 2015-07-21: qty 1

## 2015-07-21 MED ORDER — OXYCODONE HCL 5 MG PO TABS
10.0000 mg | ORAL_TABLET | Freq: Four times a day (QID) | ORAL | Status: DC | PRN
  Administered 2015-07-21 – 2015-07-24 (×10): 10 mg via ORAL
  Filled 2015-07-21 (×10): qty 2

## 2015-07-21 MED ORDER — QUETIAPINE FUMARATE 50 MG PO TABS
50.0000 mg | ORAL_TABLET | Freq: Every day | ORAL | Status: DC
  Filled 2015-07-21 (×2): qty 1

## 2015-07-21 MED ORDER — OXYCODONE HCL 5 MG PO TABS: 10.0000 mg | ORAL_TABLET | Freq: Four times a day (QID) | ORAL | Status: DC

## 2015-07-21 MED ORDER — CYCLOBENZAPRINE HCL 10 MG PO TABS
10.0000 mg | ORAL_TABLET | Freq: Once | ORAL | Status: AC
  Administered 2015-07-21: 10 mg via ORAL
  Filled 2015-07-21: qty 1

## 2015-07-21 MED ORDER — LAMOTRIGINE 100 MG PO TABS
100.0000 mg | ORAL_TABLET | Freq: Two times a day (BID) | ORAL | Status: DC
  Administered 2015-07-21 – 2015-07-24 (×6): 100 mg via ORAL
  Filled 2015-07-21 (×12): qty 1

## 2015-07-21 MED ORDER — OXYCODONE HCL 10 MG PO TABS: 10.0000 mg | ORAL_TABLET | Freq: Four times a day (QID) | ORAL | Status: DC

## 2015-07-21 MED ORDER — GABAPENTIN 300 MG PO CAPS
600.0000 mg | ORAL_CAPSULE | Freq: Two times a day (BID) | ORAL | Status: DC
  Administered 2015-07-21: 600 mg via ORAL
  Filled 2015-07-21: qty 2

## 2015-07-21 MED ORDER — ACETAMINOPHEN 325 MG PO TABS
650.0000 mg | ORAL_TABLET | Freq: Four times a day (QID) | ORAL | Status: DC | PRN
  Administered 2015-07-21 – 2015-07-24 (×5): 650 mg via ORAL
  Filled 2015-07-21 (×5): qty 2

## 2015-07-21 MED ORDER — GABAPENTIN 300 MG PO CAPS
600.0000 mg | ORAL_CAPSULE | Freq: Two times a day (BID) | ORAL | Status: DC
  Administered 2015-07-21 – 2015-07-24 (×6): 600 mg via ORAL
  Filled 2015-07-21 (×10): qty 2

## 2015-07-21 MED ORDER — CLONAZEPAM 0.5 MG PO TABS
0.5000 mg | ORAL_TABLET | ORAL | Status: DC | PRN
  Administered 2015-07-21: 0.5 mg via ORAL
  Filled 2015-07-21: qty 1

## 2015-07-21 MED ORDER — OXYCODONE HCL 5 MG PO TABS
10.0000 mg | ORAL_TABLET | Freq: Once | ORAL | Status: AC | PRN
  Administered 2015-07-21: 10 mg via ORAL
  Filled 2015-07-21: qty 2

## 2015-07-21 MED ORDER — MAGNESIUM HYDROXIDE 400 MG/5ML PO SUSP
30.0000 mL | Freq: Every day | ORAL | Status: DC | PRN
  Administered 2015-07-22: 30 mL via ORAL
  Filled 2015-07-21: qty 30

## 2015-07-21 MED ORDER — ZOLPIDEM TARTRATE 5 MG PO TABS: 10.0000 mg | ORAL_TABLET | Freq: Every evening | ORAL | Status: DC | PRN

## 2015-07-21 MED ORDER — ZOLPIDEM TARTRATE 10 MG PO TABS
10.0000 mg | ORAL_TABLET | Freq: Every evening | ORAL | Status: DC | PRN
  Administered 2015-07-21: 10 mg via ORAL
  Filled 2015-07-21: qty 1

## 2015-07-21 MED ORDER — OXYCODONE HCL 5 MG PO TABS
10.0000 mg | ORAL_TABLET | Freq: Once | ORAL | Status: AC
  Administered 2015-07-21: 10 mg via ORAL
  Filled 2015-07-21: qty 2

## 2015-07-21 NOTE — ED Notes (Signed)
Pt. returned to triage room 1 with spouse and sitter .

## 2015-07-21 NOTE — ED Notes (Addendum)
Pt to nurse's desk stating "I just want to pass the word along that I am looking for a discharge and I would like my phone."  RN explained to pt that due to policies he was unable to have his phone at this time.  Pt verbalized understanding but has a blunt affect.  Pt refuses breakfast tray that was delivered.  MD made aware of pt comments.

## 2015-07-21 NOTE — ED Notes (Signed)
TTS in progress 

## 2015-07-21 NOTE — Progress Notes (Addendum)
Data Affect appropriate, blunted.  Mood "depressed."  Denies HI, SI, AVH.  Chronic pain in back continues, see flowsheet/MAR.   Reports chronic need for PRN ambien, and that he sometimes wakes up at 3 or 4 AM and has difficulty falling back asleep.  Action Pain treated with PRN oxycodone, PRN tylenol, scheduled Flexeril.  Given PRN Ambien.  Continued on 15 minute checks.  Spoke with PA who ordered scheduled seroquel to start tomorrow, and to give tonight if he wakes up as he stated.  Offered to give patient a magazine or something to read in room if he wakes up.  Response Patient asleep shortly after PRN's, effective. Remains safe on unit.  Addendum: Patient woke up 0400, pain 6/10.  Given PRN pain medicine per request.  Asleep on 0430 check per tech.

## 2015-07-21 NOTE — ED Notes (Signed)
TTS machine placed at the bedside.
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
## 2015-07-21 NOTE — ED Notes (Signed)
The original IVC papers were placed in red folder in Case Managers office. The other 4 copies were given to Lamar HeightsBecky, RN in pod C.

## 2015-07-21 NOTE — Progress Notes (Signed)
[facility] attending MD Dr. Jama Flavorsobos. Report number for RN is [phone].  Ilean SkillMeghan Anvita Hirata, MSW, LCSW Clinical Social Work, Disposition  07/21/2015 [phone]

## 2015-07-21 NOTE — ED Notes (Signed)
Lunch tray ordered for pt, [facility] until IVC papers have been served per GuthrieBecky, RCharity fundraiser

## 2015-07-21 NOTE — ED Notes (Signed)
Pt received POD C pamphlet re: guidelines for SI & HI, pt states, "I came in here for voluntary help and now I cannot use my phone. It is my dignity that I am losing by having this guy go with me to the bathroom." pt informed of the reasoning for safety precautions, pt agrees to take breakfast tray, pt aware of physiatric recommendation & that if the pts chooses to leave at this time, IVC is likely, pt speaking with his wife on the phone, pt verbalizes understanding, pt cooperative at this time, pt verbalizes that he is not pleased with the plan

## 2015-07-21 NOTE — ED Notes (Signed)
Pt on phone at nurses' desk. 

## 2015-07-21 NOTE — Progress Notes (Signed)
Adult Psychoeducational Group Note  Date:  07/21/2015 Time:  10:19 PM  Group Topic/Focus:  Wrap-Up Group:   The focus of this group is to help patients review their daily goal of treatment and discuss progress on daily workbooks.  Participation Level:  Active  Participation Quality:  Appropriate  Affect:  Appropriate  Cognitive:  Appropriate  Insight: Good  Engagement in Group:  Engaged  Modes of Intervention:  Activity  Additional Comments:  Patient rated his day a 4. He stated he was extremely disappointed he was here but goal is to learn positive coping skills for anger.  Natasha MeadKiara M Milah Recht 07/21/2015, 10:19 PM

## 2015-07-21 NOTE — Progress Notes (Signed)
Received report from admitting nurse, Lissa Merlin. Met with patient who is flat, sad and depressed. Tearful at times. Complaining of chronic back pain of a 7/10. Asked patient about his prn pain meds at home. Called [facility] and verified Rx's which were re-ordered by Clydie Braun, NP. Patient made aware and oxycodone '10mg'$  po given. Will reassess at one hour. Patient currently resting in bed. Cooperative and remains safe on level III obs.

## 2015-07-21 NOTE — ED Notes (Signed)
Pt ambulatory to c24 w/sitter and RN. Pt wearing paper scrubs and his eyeglasses.

## 2015-07-21 NOTE — Tx Team (Signed)
Initial Interdisciplinary Treatment Plan   PATIENT STRESSORS: Health problems Medication change or noncompliance Occupational concerns Chronic Pain   PATIENT STRENGTHS: Ability for insight Average or above average intelligence Capable of independent living Communication skills Financial means Motivation for treatment/growth Supportive family/friends   PROBLEM LIST: Problem List/Patient Goals Date to be addressed Date deferred Reason deferred Estimated date of resolution  At risk for suicide  07/21/2015  07/21/2015   D/C  Depression 07/21/2015  07/21/2015   D/C  Aggression 07/21/2015  07/21/2015   D/C  "Control anger" 07/21/2015  07/21/2015   D/C  "Coping Mechanism for my depression" 07/21/2015  07/21/2015   D/C                           DISCHARGE CRITERIA:  Ability to meet basic life and health needs Improved stabilization in mood, thinking, and/or behavior Medical problems require only outpatient monitoring Motivation to continue treatment in a less acute level of care Need for constant or close observation no longer present Reduction of life-threatening or endangering symptoms to within safe limits  PRELIMINARY DISCHARGE PLAN: Outpatient therapy Return to previous living arrangement Return to previous work or school arrangements  PATIENT/FAMIILY INVOLVEMENT: This treatment plan has been presented to and reviewed with the patient, Desman T Yetta BarreJones.  The patient and family have been given the opportunity to ask questions and make suggestions.  Larry SierrasMiddleton, Ameet Sandy P 07/21/2015, 4:28 PM
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
## 2015-07-21 NOTE — ED Notes (Signed)
Dr. Mora Bellmanni and charge nurse notified that pt. left the emergency room , security / [employer] notified.

## 2015-07-21 NOTE — ED Provider Notes (Addendum)
CSN: [id number]     Arrival date & time 07/20/15  2243 History   First MD Initiated Contact with Patient 07/21/15 0425     Chief Complaint  Patient presents with  . Depression  . Aggressive Behavior  . Suicidal    (Consider location/radiation/quality/duration/timing/severity/associated sxs/prior Treatment) Patient is a 45 y.o. male presenting with depression. The history is provided by the patient and medical records. No language interpreter was used.  Depression Pertinent negatives include no abdominal pain, chest pain, congestion, coughing, fever or headaches.  Shawn Cervantes is a 45 y.o. male  with a PMH of ADHD, anxiety, depression, bipolar d/o who presents to the Emergency Department for worsening depression. Patient states that he underwent back surgery on his L-spine approximately 1 year ago-ever since this time he has had a tremendous amount of excruciating pain. He believes the pain is the cause for his worsening  Depression. He endorses that he was contemplating suicide multiple times this week. At present moment, he denies suicidal ideations and has no plan, but states that he was "worried about his own safety today" and voluntarily came in with wife for help. He also admits to "fits of rage". Today his rage was so bad that he had to leave work early for fear he would do something he regretted. Denies homicidal ideations, visual hallucinations, auditory hallucinations.   Past Medical History  Diagnosis Date  . Complication of anesthesia   . PONV (postoperative nausea and vomiting)   . Anxiety   . Mental disorder     Bipolar  . Depression     bi polar  . ADHD (attention deficit hyperactivity disorder)   . Head injury     as a child ;had stitches in head  . Constipation   . Testosterone deficiency   . Arthritis     knee and back   Past Surgical History  Procedure Laterality Date  . Hand surgery Left     tendon repai x2  . Microdiscectomy lumbar      L4-5  . Wrist  surgery Right     reconstution  . Wrist fusion Right 1998  . Wrist arthroscopy    . Vasectomy    . 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 Microdiscectomy lumbar      L4-5  . Lumbar wound debridement N/A 07/30/2014    Procedure: LUMBAR WOUND IRRIGATION AND DEBRIDEMENT,        ;  Surgeon: Tia Alert, MD;  Location: [facility];  Service: Neurosurgery;  Laterality: N/A;  . Transforaminal lumbar interbody fusion (tlif) with pedicle screw fixation 1 level N/A 07/17/2014    Procedure: Transforaminal Lumbar Interbody Fusion Lumbar four-five with pedicle screws  ;  Surgeon: Tia Alert, MD;  Location: [facility];  Service: Neurosurgery;  Laterality: N/A;   History reviewed. No pertinent family history. Social History  Substance Use Topics  . Smoking status: Former Smoker -- 0.00 packs/day    Types: Cigars    Quit date: 10/14/2013  . Smokeless tobacco: Never Used  . Alcohol Use: No    Review of Systems  Constitutional: Negative for fever.  HENT: Negative for congestion.   Eyes: Negative for visual disturbance.  Respiratory: Negative for cough and shortness of breath.   Cardiovascular: Negative for chest pain.  Gastrointestinal: Negative for abdominal  pain.  Musculoskeletal: Positive for back pain.  Allergic/Immunologic: Negative for immunocompromised state.  Neurological: Negative for headaches.  Psychiatric/Behavioral: Positive for depression and agitation.      Allergies  Review of patient's allergies indicates no known allergies.  Home Medications   Prior to Admission medications   Medication Sig Start Date End Date Taking? Authorizing Provider  amphetamine-dextroamphetamine (ADDERALL) 15 MG tablet Take 15 mg by mouth daily.  09/26/12   Historical Provider, MD  clonazePAM (KLONOPIN) 0.5 MG  tablet Take 0.25 mg by mouth as needed for anxiety.  08/21/12   Historical Provider, MD  cyclobenzaprine (FLEXERIL) 10 MG tablet Take 1 tablet (10 mg total) by mouth 3 (three) times daily as needed for muscle spasms. 07/18/14   Tia Alertavid S Allbright, MD  divalproex (DEPAKOTE ER) 500 MG 24 hr tablet Take 1,500 mg by mouth at bedtime.    Historical Provider, MD  doxycycline (DORYX) 100 MG EC tablet Take 100 mg by mouth 2 (two) times daily.    Historical Provider, MD  fish oil-omega-3 fatty acids 1000 MG capsule Take 2 g by mouth daily.    Historical Provider, MD  gabapentin (NEURONTIN) 600 MG tablet Take 600 mg by mouth at bedtime.    Historical Provider, MD  lamoTRIgine (LAMICTAL) 100 MG tablet Take 100 mg by mouth daily.     Historical Provider, MD  oxyCODONE-acetaminophen (ROXICET) 5-325 MG per tablet Take 1-2 tablets by mouth every 4 (four) hours as needed for moderate pain. 07/31/14   Tia Alertavid S Bohl, MD  Testosterone 10 MG/ACT (2%) GEL APPLY 3 PUMPS ON EACH SIDE AS DIRECTED 07/08/14   Reather LittlerAjay Kumar, MD  zolpidem (AMBIEN) 10 MG tablet Take 10 mg by mouth at bedtime as needed for sleep.    Historical Provider, MD   BP 127/99 mmHg  Pulse 82  Temp(Src) 97.9 F (36.6 C) (Oral)  Resp 18  Ht 6\' 1"  (1.854 m)  Wt 106.595 kg  BMI 31.01 kg/m2  SpO2 98% Physical Exam  Constitutional: He is oriented to person, place, and time. He appears well-developed and well-nourished.  Alert and in no acute distress  HENT:  Head: Normocephalic and atraumatic.  Cardiovascular: Normal rate, regular rhythm and normal heart sounds.  Exam reveals no gallop and no friction rub.   No murmur heard. Pulmonary/Chest: Effort normal and breath sounds normal. No respiratory distress. He has no wheezes. He has no rales. He exhibits no tenderness.  Abdominal: Soft. He exhibits no distension. There is no tenderness.  Musculoskeletal: Normal range of motion.  Neurological: He is alert and oriented to person, place, and time.  Skin: Skin is  warm and dry.  Nursing note and vitals reviewed.   ED Course  Procedures (including critical care time) Labs Review Labs Reviewed  ACETAMINOPHEN LEVEL - Abnormal; Notable for the following:    Acetaminophen (Tylenol), Serum <10 (*)    All other components within normal limits  COMPREHENSIVE METABOLIC PANEL  ETHANOL  SALICYLATE LEVEL  CBC  URINE RAPID DRUG SCREEN, HOSP PERFORMED    Imaging Review No results found. I have personally reviewed and evaluated these images and lab results as part of my medical decision-making.   EKG Interpretation None      MDM   Final diagnoses:  None   Shawn Cervantes presents to ED for worsening depression and concerned that he may hurt himself. Thoughts of suicide this week, but not at present moment. All labs reviewed by me and are within defined limits. The patient  states he has not had his home pain medication for his chronic back pain. Home oxycodone dose ordered. Dispo pending TTS.   [facility] Ward, PA-C 07/21/15 0458  Gilda Crease, MD 07/21/15 0544  Chase Picket Ward, PA-C 07/21/15 1610  Gilda Crease, MD 07/21/15 [phone]

## 2015-07-22 ENCOUNTER — Encounter (HOSPITAL_COMMUNITY): Payer: Self-pay | Admitting: Psychiatry

## 2015-07-22 DIAGNOSIS — F332 Major depressive disorder, recurrent severe without psychotic features: Secondary | ICD-10-CM | POA: Diagnosis present

## 2015-07-22 MED ORDER — CYCLOBENZAPRINE HCL 10 MG PO TABS
10.0000 mg | ORAL_TABLET | Freq: Three times a day (TID) | ORAL | Status: DC | PRN
  Administered 2015-07-22 – 2015-07-24 (×5): 10 mg via ORAL
  Filled 2015-07-22 (×5): qty 1

## 2015-07-22 MED ORDER — DULOXETINE HCL 20 MG PO CPEP
20.0000 mg | ORAL_CAPSULE | Freq: Every day | ORAL | Status: DC
  Administered 2015-07-22 – 2015-07-23 (×2): 20 mg via ORAL
  Filled 2015-07-22 (×4): qty 1

## 2015-07-22 MED ORDER — ZOLPIDEM TARTRATE 5 MG PO TABS
5.0000 mg | ORAL_TABLET | Freq: Every evening | ORAL | Status: DC | PRN
  Administered 2015-07-22: 5 mg via ORAL
  Filled 2015-07-22: qty 1

## 2015-07-22 NOTE — Tx Team (Addendum)
Interdisciplinary Treatment Plan Update (Adult) Date: 07/22/2015    Time Reviewed: 9:30 AM  Progress in Treatment: Attending groups: Minimally Participating in groups: Minimally Taking medication as prescribed: Yes Tolerating medication: Yes Family/Significant other contact made: Yes- CSW has spoken with wife Patient understands diagnosis: Yes Discussing patient identified problems/goals with staff: Yes Medical problems stabilized or resolved: Yes Denies suicidal/homicidal ideation: Yes Issues/concerns per patient self-inventory: Yes Other:  New problem(s) identified: N/A  Discharge Plan or Barriers: Patient plans to return home to follow up with current outpatient providers.   Reason for Continuation of Hospitalization:  Depression Anxiety Medication Stabilization   Comments: N/A  Estimated length of stay: Discharge anticipated for today 07/24/15    Patient is a 45 year old male who presented to the hospital with SI and plan to OD on medications. Patient reports a history of Bipolar Disorder. Pt reports primary trigger(s) for admission was chronic back pain and increased anger. Patient will benefit from crisis stabilization, medication evaluation, group therapy and psycho education in addition to case management for discharge planning. At discharge, it is recommended that Pt remain compliant with established discharge plan and continued treatment.   Review of initial/current patient goals per problem list:  1. Goal(s): Patient will participate in aftercare plan   Met: Yes   Target date: 3-5 days post admission date   As evidenced by: Patient will participate within aftercare plan AEB aftercare provider and housing plan at discharge being identified.  5/10: Goal not met: CSW assessing for appropriate referrals for pt and will have follow up secured prior to d/c.  5/12: Goal met. Patient plans to return home to follow up with outpatient services.    2. Goal (s):  Patient will exhibit decreased depressive symptoms and suicidal ideations.   Met: Adequate for discharge per MD   Target date: 3-5 days post admission date   As evidenced by: Patient will utilize self rating of depression at 3 or below and demonstrate decreased signs of depression or be deemed stable for discharge by MD.  5/10: Goal not met: Pt presents with flat affect and depressed mood.  Pt admitted with depression rating of 10.  Pt to show decreased sign of depression and a rating of 3 or less before d/c.    5/12: Adequate for discharge per MD. Patient rates depression at 4, denies SI.   3. Goal(s): Patient will demonstrate decreased signs and symptoms of anxiety.   Met: Yes   Target date: 3-5 days post admission date   As evidenced by: Patient will utilize self rating of anxiety at 3 or below and demonstrated decreased signs of anxiety, or be deemed stable for discharge by MD  5/10: Goal not met: Pt presents with anxious mood and affect.  Pt admitted with anxiety rating of 10.  Pt to show decreased sign of anxiety and a rating of 3 or less before d/c.  5/12: Goal met. Patient rates anxiety at 1.     Attendees: Patient:    Family:    Physician: Dr. Parke Poisson 07/22/2015 9:30 AM  Nursing: Loletta Specter, Darrol Angel, RN 07/22/2015 9:30 AM  Clinical Social Worker: Tilden Fossa, LCSW 07/22/2015 9:30 AM  Other: Maxie Better, LCSW  07/22/2015 9:30 AM  Other:  07/22/2015 9:30 AM  Other: Lars Pinks, Case Manager 07/22/2015 9:30 AM  Other: Andria Rhein, NP 07/22/2015 9:30 AM  Other:    Other:      Scribe for Treatment Team:  Tilden Fossa, [place]

## 2015-07-22 NOTE — [facility] Suicide Risk Assessment (Signed)
[facility]   Nursing information obtained from:  Patient Demographic factors:  Male Current Mental Status:  NA Loss Factors:  Decline in physical health Historical Factors:  Prior suicide attempts, Family history of mental illness or substance abuse Risk Reduction Factors:  Employed, Living with another person, especially a relative, Positive social support, Positive therapeutic relationship  Total Time spent with patient: 45 minutes Principal Problem: Severe episode of recurrent major depressive disorder, without psychotic features (HCC) Diagnosis:   Patient Active Problem List   Diagnosis Date Noted  . Severe episode of recurrent major depressive disorder, without psychotic features (HCC) [F33.2]   . Bipolar affective disorder, current episode mixed (HCC) [F31.60] 07/21/2015  . Wound dehiscence [T81.31XA] 07/30/2014  . S/P lumbar spinal fusion [Z98.1] 07/17/2014  . HNP (herniated nucleus pulposus), lumbar [M51.26] 04/01/2013    Class: Diagnosis of  . 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  World [employer] [facility](WHO). Score between 0-7:  no or low risk or alcohol related problems. Score between 8-15:  moderate risk of alcohol related problems. Score between 16-19:  high risk of alcohol related problems. Score 20 or above:  warrants further diagnostic evaluation for alcohol dependence and treatment.   CLINICAL FACTORS:  45 year old married, employed male, reports worsening depression, recent passive SI, denies active SI at this time, attributes pain in part to chronic back pain, which did not improve after back surgery last year.      Psychiatric Specialty Exam: ROS  Blood pressure 107/73, pulse 84, temperature 97.5 F  (36.4 C), temperature source Oral, resp. rate 16, height 6\' 1"  (1.854 m), weight 235 lb (106.595 kg), SpO2 99 %.Body mass index is 31.01 kg/(m^2).   see admit note MSE                                                       COGNITIVE FEATURES THAT CONTRIBUTE TO RISK:  Closed-mindedness and Loss of executive function    SUICIDE RISK:   Moderate:  Frequent suicidal ideation with limited intensity, and duration, some specificity in terms of plans, no associated intent, good self-control, limited dysphoria/symptomatology, some risk factors present, and identifiable protective factors, including available and accessible social support.  PLAN OF CARE: Patient will be admitted to inpatient psychiatric unit for stabilization and safety. Will provide and encourage milieu participation. Provide medication management and maked adjustments as needed.  Will follow daily.    I certify that inpatient services furnished can reasonably be expected to improve the patient's condition.   Nehemiah MassedOBOS, FERNANDO, MD 07/22/2015, 12:03 PM

## 2015-07-22 NOTE — Progress Notes (Signed)
Adult Psychoeducational Group Note  Date:  07/22/2015 Time:  9:36 PM  Group Topic/Focus:  Wrap-Up Group:   The focus of this group is to help patients review their daily goal of treatment and discuss progress on daily workbooks.  Participation Level:  Active  Participation Quality:  Appropriate and Attentive  Affect:  Appropriate  Cognitive:  Appropriate  Insight: Appropriate and Good  Engagement in Group:  Engaged  Modes of Intervention:  Discussion  Additional Comments:  Pt rated his day a 5 because he complains about his back issues. Pt goal for tomorrow is to interact more.   Merlinda FrederickKeshia S Dorothye Berni 07/22/2015, 9:36 PM

## 2015-07-22 NOTE — [facility] Group Notes (Signed)
[facility] LCSW Group Therapy 07/22/2015  1:15 PM Type of Therapy: Group Therapy Participation Level: Minimal  Participation Quality: Attentive  Affect: Blunted  Cognitive: Alert and Oriented  Insight: Developing/Improving and Engaged  Engagement in Therapy: Developing/Improving and Engaged  Modes of Intervention: Clarification, Confrontation, Discussion, Education, Exploration, Limit-setting, Orientation, Problem-solving, Rapport Building, Reality Testing, Socialization and Support  Summary of Progress/Problems: The topic for group today was emotional regulation. This group focused on both positive and negative emotion identification and allowed group members to process ways to identify feelings, regulate negative emotions, and find healthy ways to manage internal/external emotions. Group members were asked to reflect on a time when their reaction to an emotion led to a negative outcome and explored how alternative responses using emotion regulation would have benefited them. Group members were also asked to discuss a time when emotion regulation was utilized when a negative emotion was experienced. Patient participated minimally in discussion despite CSW encouragement.   Zuriah Bordas, MSW, LCSW Clinical Social Worker [facility] [phone]    

## 2015-07-22 NOTE — Progress Notes (Signed)
Patient remains flat, sad, sullen with irritable and depressed mood. Rates his depression and hopelessness both at a 5/10, anxiety at a 3/10. Reports fair sleep, good appetite but poor energy level. Expressed frustration that he's here. States our beds, chairs and general environment are not conducive to managing his back pain. Reports he had an appt this morning with his orthopedic MD to consider spinal stimulation as the next step in treatment. States his goal for today is to learn "coping skills for anger and to participate in groups."  Medicated per orders, oxycodone and tylenol given prn for pain, currently at a 9/10. Emotional support and reassurance offered. Self inventory reviewed. Patient denies SI/HI and remains safe on level III obs.

## 2015-07-22 NOTE — Progress Notes (Signed)
[facility] Group Notes:  (Nursing/MHT/Case Management/Adjunct)  Date:  07/22/2015  Time:  10:33 AM  Type of Therapy:  Psychoeducational Skills  Participation Level:  Active  Participation Quality:  Appropriate  Affect:  Appropriate  Cognitive:  Alert and Appropriate  Insight:  Good  Engagement in Group:  Improving  Modes of Intervention:  Clarification and Discussion  Summary of Progress/Problems: Pt was able to discuss his steps for change  Donell BeersRodney S Winfrey Chillemi 07/22/2015, 10:33 AM

## 2015-07-22 NOTE — Progress Notes (Signed)
Recreation Therapy Notes  Date: 05.10.2017 Time: 9:30am Location: [address] Day[address]  Group Topic: Stress Management  Goal Area(s) Addresses:  Patient will actively participate in stress management techniques presented during session.   Behavioral Response: Did not attend.   Lenard Kampf L Lashonne Shull, LRT/CTRS        Novalie Leamy L 07/22/2015 12:11 PM 

## 2015-07-22 NOTE — Plan of Care (Signed)
Problem: Ineffective individual coping Goal: STG: Patient will remain free from self harm Outcome: Progressing Patient has not engaged in self harm. Denies SI.  Problem: Diagnosis: Increased Risk For Suicide Attempt Goal: STG-Patient Will Comply With Medication Regime Outcome: Progressing Patient med compliant.

## 2015-07-22 NOTE — H&P (Addendum)
Psychiatric Admission Assessment Adult  Patient Identification: Shawn Cervantes MRN:  [id number] Date of Evaluation:  07/22/2015 Chief Complaint:  "  I felt like I needed help"  Principal Diagnosis: Depression Diagnosis:   Patient Active Problem List   Diagnosis Date Noted  . Bipolar affective disorder, current episode mixed ([place]) [F31.60] 07/21/2015  . Wound dehiscence [T81.31XA] 07/30/2014  . S/P lumbar spinal fusion [Z98.1] 07/17/2014  . HNP (herniated nucleus pulposus), lumbar [M51.26] 04/01/2013    Class: Diagnosis of  . 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  States he has chronic pain, and he feels the chronicity and intensity of it has been causing him to feel depressed , and also more irritable. States " I was feeling agitated, felt like I wanted to throw stuff ", so decided to go to the hospital. Reports some passive thoughts of death,but denies any actual suicidal ideations, denies plan or intention of hurting self  Describes neuro vegetative symptoms of depression as below .  Associated Signs/Symptoms: Depression Symptoms:  depressed mood, anhedonia, insomnia, feelings of worthlessness/guilt, recurrent thoughts of death, loss of energy/fatigue, (Hypo) Manic Symptoms: describes irritability, anger, which he states has been related to pain . Anxiety Symptoms:  Describes recent panic attacks, denies agoraphobia, describes some social anxiety  Psychotic Symptoms: denies  PTSD Symptoms: Denies  Total Time spent with patient: 45 minutes  Past Psychiatric History:  Has had prior psychiatric admissions ( x2 ) , last time 3/14.  Has attempted suicide ( cut wrist) in his early 20's. Denies history of psychosis.  Has been diagnosed with bipolar disorder in the past, but at this time describes mostly depression and does not describe any clear history of mania. States  he does have brief episodes where he " feels a little better, sleeps less, works more".     Is the patient at risk to self? Yes.    Has the patient been a risk to self in the past 6 months? Yes.    Has the patient been a risk to self within the distant past? Yes.    Is the patient a risk to others? No.  Has the patient been a risk to others in the past 6 months? No.  Has the patient been a risk to others within the distant past? No.   Prior Inpatient Therapy:  has been hospitalized before, most recently 2014 Prior Outpatient Therapy:  follows up at [facility]   Alcohol Screening: 1. How often do you have a drink containing alcohol?: Monthly or less 2. How many drinks containing alcohol do you have on a typical day when you are drinking?: 1 or 2 3. How often do you have six or more drinks on one occasion?: Never Preliminary Score: 0 9. Have you or someone else been injured as a result of your drinking?: No 10. Has a relative or friend or a doctor or another health worker been concerned about your drinking or suggested you cut down?: No Alcohol Use Disorder Identification Test Final Score (AUDIT): 1 Brief Intervention: AUDIT score less than 7 or less-screening does not suggest unhealthy drinking-brief intervention not indicated Substance Abuse History in the last 12 months:  History of alcohol abuse , states he self medicated for pain, states he stopped years ago- drinks one beer occasionally. He has been on opiates x several months to years, denies abuse or illegal use .  ( of  note, UDS negative for opiates , patient states he has been taking opiates as prescribed up to admission )  Denies IVDA.  Consequences of Substance Abuse: Denies  Previous Psychotropic Medications: no  Psychological Evaluations: no  Past Medical History: States he had a spinal fusion last year, which did not work well for him, with ongoing back pain . Past Medical History  Diagnosis Date  . Complication  of anesthesia   . PONV (postoperative nausea and vomiting)   . Anxiety   . Mental disorder     Bipolar  . Depression     bi polar  . ADHD (attention deficit hyperactivity disorder)   . Head injury     as a child ;had stitches in head  . Constipation   . Testosterone deficiency   . Arthritis     knee and back  . Bipolar 1 disorder [facility])     Past Surgical History  Procedure Laterality Date  . Hand surgery Left     tendon repai x2  . Microdiscectomy lumbar      L4-5  . Wrist surgery Right     reconstution  . Wrist fusion Right 1998  . Wrist arthroscopy    . Vasectomy    . Lumbar laminectomy Right 04/01/2013    Procedure: MICRODISCECTOMY LUMBAR LAMINECTOMY;  Surgeon: Marybelle Killings, MD;  Location: [place];  Service: Orthopedics;  Laterality: Right;  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  . Microdiscectomy lumbar      L4-5  . Lumbar wound debridement N/A 07/30/2014    Procedure: LUMBAR WOUND IRRIGATION AND DEBRIDEMENT,        ;  Surgeon: Eustace Moore, MD;  Location: [place] [facility];  Service: Neurosurgery;  Laterality: N/A;  . Transforaminal lumbar interbody fusion (tlif) with pedicle screw fixation 1 level N/A 07/17/2014    Procedure: Transforaminal Lumbar Interbody Fusion Lumbar four-five with pedicle screws  ;  Surgeon: Eustace Moore, MD;  Location: [place] [facility];  Service: Neurosurgery;  Laterality: N/A;   Family History:  Mother alive, father passed away from CHF, has two half sisters  Family Psychiatric  History: father had history of alcohol abuse, no suicides in family, son has history of Bipolar Disorder  Tobacco Screening: does not smoke  Social History:  Married x 2, states marriage is stable, has two adult children, employed, 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 [employer], denies legal , denies financial issues  History  Alcohol Use No     History  Drug Use No    Additional Social History:  Allergies:  No Known Allergies Lab Results:  Results for orders placed or performed during the  hospital encounter of 07/21/15 (from the past 48 hour(s))  Rapid urine drug screen (hospital performed)     Status: None   Collection Time: 07/20/15 11:06 PM  Result Value Ref Range   Opiates NONE DETECTED NONE DETECTED   Cocaine NONE DETECTED NONE DETECTED   Benzodiazepines NONE DETECTED NONE DETECTED   Amphetamines NONE DETECTED NONE DETECTED   Tetrahydrocannabinol NONE DETECTED NONE DETECTED   Barbiturates NONE DETECTED NONE DETECTED    Comment:        DRUG SCREEN FOR MEDICAL PURPOSES ONLY.  IF CONFIRMATION IS NEEDED FOR ANY PURPOSE, NOTIFY LAB WITHIN 5 DAYS.        LOWEST DETECTABLE LIMITS FOR URINE DRUG SCREEN Drug Class       Cutoff (ng/mL) Amphetamine      1000 Barbiturate      200 Benzodiazepine  212 Tricyclics       248 Opiates          300 Cocaine          300 THC              50   Comprehensive metabolic panel     Status: None   Collection Time: 07/20/15 11:10 PM  Result Value Ref Range   Sodium 139 135 - 145 mmol/L   Potassium 3.6 3.5 - 5.1 mmol/L   Chloride 104 101 - 111 mmol/L   CO2 23 22 - 32 mmol/L   Glucose, Bld 92 65 - 99 mg/dL   BUN 8 6 - 20 mg/dL   Creatinine, Ser 0.89 0.61 - 1.24 mg/dL   Calcium 9.3 8.9 - 10.3 mg/dL   Total Protein 6.7 6.5 - 8.1 g/dL   Albumin 4.0 3.5 - 5.0 g/dL   AST 18 15 - 41 U/L   ALT 21 17 - 63 U/L   Alkaline Phosphatase 40 38 - 126 U/L   Total Bilirubin 0.8 0.3 - 1.2 mg/dL   GFR calc non Af Amer >60 >60 mL/min   GFR calc Af Amer >60 >60 mL/min    Comment: (NOTE) The eGFR has been calculated using the CKD EPI equation. This calculation has not been validated in all clinical situations. eGFR's persistently <60 mL/min signify possible Chronic Kidney Disease.    Anion gap 12 5 - 15  Ethanol     Status: None   Collection Time: 07/20/15 11:10 PM  Result Value Ref Range   Alcohol, Ethyl (B) <5 <5 mg/dL    Comment:        LOWEST DETECTABLE LIMIT FOR SERUM ALCOHOL IS 5 mg/dL FOR MEDICAL PURPOSES ONLY   Salicylate level      Status: None   Collection Time: 07/20/15 11:10 PM  Result Value Ref Range   Salicylate Lvl <2.5 2.8 - 30.0 mg/dL  Acetaminophen level     Status: Abnormal   Collection Time: 07/20/15 11:10 PM  Result Value Ref Range   Acetaminophen (Tylenol), Serum <10 (L) 10 - 30 ug/mL    Comment:        THERAPEUTIC CONCENTRATIONS VARY SIGNIFICANTLY. A RANGE OF 10-30 ug/mL MAY BE AN EFFECTIVE CONCENTRATION FOR MANY PATIENTS. HOWEVER, SOME ARE BEST TREATED AT CONCENTRATIONS OUTSIDE THIS RANGE. ACETAMINOPHEN CONCENTRATIONS >150 ug/mL AT 4 HOURS AFTER INGESTION AND >50 ug/mL AT 12 HOURS AFTER INGESTION ARE OFTEN ASSOCIATED WITH TOXIC REACTIONS.   cbc     Status: None   Collection Time: 07/20/15 11:10 PM  Result Value Ref Range   WBC 6.1 4.0 - 10.5 K/uL   RBC 5.11 4.22 - 5.81 MIL/uL   Hemoglobin 15.7 13.0 - 17.0 g/dL   HCT 44.7 39.0 - 52.0 %   MCV 87.5 78.0 - 100.0 fL   MCH 30.7 26.0 - 34.0 pg   MCHC 35.1 30.0 - 36.0 g/dL   RDW 12.3 11.5 - 15.5 %   Platelets 242 150 - 400 K/uL    Blood Alcohol level:  Lab Results  Component Value Date   ETH <5 07/20/2015   [facility]  04/22/2010    <5        LOWEST DETECTABLE LIMIT FOR SERUM ALCOHOL IS 5 mg/dL FOR MEDICAL PURPOSES ONLY    Metabolic Disorder Labs:  No results found for: HGBA1C, MPG No results found for: PROLACTIN No results found for: CHOL, TRIG, HDL, CHOLHDL, VLDL, LDLCALC  Current Medications: Current Facility-Administered Medications  Medication Dose Route Frequency Provider Last Rate Last Dose  . acetaminophen (TYLENOL) tablet 650 mg  650 mg Oral Q6H PRN Patrecia Pour, NP   650 mg at 07/22/15 0958  . alum & mag hydroxide-simeth (MAALOX/MYLANTA) 200-200-20 MG/5ML suspension 30 mL  30 mL Oral Q4H PRN Patrecia Pour, NP      . cyclobenzaprine (FLEXERIL) tablet 10 mg  10 mg Oral TID Encarnacion Slates, NP   10 mg at 07/22/15 0756  . gabapentin (NEURONTIN) capsule 600 mg  600 mg Oral BID Patrecia Pour, NP   600 mg at 07/22/15 0756  .  lamoTRIgine (LAMICTAL) tablet 100 mg  100 mg Oral BID Patrecia Pour, NP   100 mg at 07/22/15 0756  . magnesium hydroxide (MILK OF MAGNESIA) suspension 30 mL  30 mL Oral Daily PRN Patrecia Pour, NP      . oxyCODONE (Oxy IR/ROXICODONE) immediate release tablet 10 mg  10 mg Oral Q6H PRN Encarnacion Slates, NP   10 mg at 07/22/15 0959  . QUEtiapine (SEROQUEL) tablet 50 mg  50 mg Oral QHS Laverle Hobby, PA-C   50 mg at 07/21/15 2330  . zolpidem (AMBIEN) tablet 10 mg  10 mg Oral QHS PRN Patrecia Pour, NP   10 mg at 07/21/15 2140   PTA Medications: Prescriptions prior to admission  Medication Sig Dispense Refill Last Dose  . cyclobenzaprine (FLEXERIL) 10 MG tablet Take 1 tablet (10 mg total) by mouth 3 (three) times daily as needed for muscle spasms. 90 tablet 1 07/20/2015 at Unknown time  . gabapentin (NEURONTIN) 600 MG tablet Take 600 mg by mouth 2 (two) times daily.    07/20/2015 at Unknown time  . lamoTRIgine (LAMICTAL) 100 MG tablet Take 100 mg by mouth 2 (two) times daily.    07/20/2015 at Unknown time  . lurasidone (LATUDA) 20 MG TABS tablet Take 20 mg by mouth daily.   07/20/2015 at Unknown time  . zolpidem (AMBIEN) 10 MG tablet Take 10 mg by mouth at bedtime as needed for sleep.   07/20/2015 at Unknown time    Musculoskeletal: Strength & Muscle Tone: within normal limits Gait & Station: normal Patient leans: N/A  Psychiatric Specialty Exam: Physical Exam  Review of Systems  Constitutional: Negative.   HENT: Negative.   Eyes: Negative.   Respiratory: Negative.   Cardiovascular: Negative.   Gastrointestinal: Negative.   Genitourinary: Negative.   Musculoskeletal: Positive for back pain.       Pain radiates to leg  Skin: Negative.   Neurological: Negative for seizures.  Endo/Heme/Allergies: Negative.   Psychiatric/Behavioral: Positive for depression and suicidal ideas.  All other systems reviewed and are negative.   Blood pressure 107/73, pulse 84, temperature 97.5 F (36.4 C),  temperature source Oral, resp. rate 16, height '6\' 1"'$  (1.854 m), weight 235 lb (106.595 kg), SpO2 99 %.Body mass index is 31.01 kg/(m^2).  General Appearance: Fairly Groomed  Engineer, water::  Fair  Speech:  Normal Rate  Volume:  Decreased  Mood:  Depressed and Dysphoric  Affect:  Constricted and slightly irritable  Thought Process:  Linear  Orientation:  Other:  fully alert and attentive   Thought Content:  ruminative about pain, no hallucinations, no delusions   Suicidal Thoughts:  denies suicidal plan or intention, describes  passive thoughts of death at times  contracts for safety on the unit   Homicidal Thoughts:  No  Memory:  recent and remote grossly intact  Judgement:  Fair  Insight:  Fair  Psychomotor Activity:  Normal  Concentration:  Good  Recall:  Good  Fund of Knowledge:Good  Language: Good  Akathisia:  Negative  Handed:  Right  AIMS (if indicated):     Assets:  Communication Skills Desire for Improvement Resilience  ADL's:  Intact  Cognition: WNL  Sleep:  Number of Hours: 6.25     Treatment Plan Summary: Daily contact with patient to assess and evaluate symptoms and progress in treatment, Medication management, Plan inpatient treatment  and medications as below   Observation Level/Precautions:  15 minute checks  Laboratory:  as needed   Psychotherapy:  Milieu , support   Medications:  We discussed medication issues at length- states he has been on oxycodone / flexeril for  Months /years, and also on Ambien for insomnia- denies any side effects, denies excessive sedation, denies any drug drug interactions . Agrees to Cymbalta trial for depression, pain. As noted, patient states he does not feel he has Bipolar Disorder- Does not want to take Seroquel or Latuda, states " I do not think they help or that I need them".   Consultations:  As needed   Discharge Concerns:  -   Estimated LOS: 4 days   Other:     I certify that inpatient services furnished can reasonably  be expected to improve the patient's condition.    Neita Garnet, MD 5/10/201711:10 AM

## 2015-07-22 NOTE — Progress Notes (Signed)
D: Pt was in his room with a visitor upon initial approach.  Pt has depressed affect and mood.  He reports his goal today was "to really just make sure I was up and attentive" and "I did a pretty good job of it."  He reports he had a "great" visit with his wife tonight.  Pt reports he has had "a lot of pain" today.  Pt denies SI/HI, denies hallucinations, reports chronic back pain of 7/10.  Pt has been visible in milieu interacting with peers and staff appropriately.  Pt attended evening group.   A: Introduced self to pt.  Met with pt 1:1 and provided support and encouragement.  Actively listened to pt.  Medications administered per order.  PRN medication administered for pain, muscle spasms, sleep. R: Pt is compliant with medications.  He reports his pain came down to a 4, which is "the lowest it has been in the past month."  Pt verbally contracts for safety.  Will continue to monitor and assess.

## 2015-07-22 NOTE — Plan of Care (Signed)
Problem: Diagnosis: Increased Risk For Suicide Attempt Goal: STG-Patient Will Attend All Groups On The Unit Outcome: Progressing Pt attended evening group on 07/22/15     

## 2015-07-23 MED ORDER — ZOLPIDEM TARTRATE 10 MG PO TABS
10.0000 mg | ORAL_TABLET | Freq: Every evening | ORAL | Status: DC | PRN
  Administered 2015-07-23: 10 mg via ORAL
  Filled 2015-07-23: qty 1

## 2015-07-23 MED ORDER — TESTOSTERONE 50 MG/5GM (1%) TD GEL
5.0000 g | Freq: Every day | TRANSDERMAL | Status: DC
  Administered 2015-07-23 – 2015-07-24 (×2): 5 g via TRANSDERMAL
  Filled 2015-07-23 (×2): qty 5

## 2015-07-23 MED ORDER — DOCUSATE SODIUM 100 MG PO CAPS
100.0000 mg | ORAL_CAPSULE | Freq: Two times a day (BID) | ORAL | Status: DC
  Administered 2015-07-23 – 2015-07-24 (×2): 100 mg via ORAL
  Filled 2015-07-23 (×7): qty 1

## 2015-07-23 MED ORDER — DULOXETINE HCL 30 MG PO CPEP
30.0000 mg | ORAL_CAPSULE | Freq: Every day | ORAL | Status: DC
  Administered 2015-07-24: 30 mg via ORAL
  Filled 2015-07-23 (×3): qty 1

## 2015-07-23 NOTE — Progress Notes (Signed)
Pt attend music therapy this evening.  

## 2015-07-23 NOTE — [facility] Suicide Risk Assessment (Signed)
[facility] INPATIENT:  Family/Significant Other Suicide Prevention Education  Suicide Prevention Education:  Education Completed; Soundra PilonCatherine Marcotte, Pt's wife [phone], has been identified by the patient as the family member/significant other with whom the patient will be residing, and identified as the person(s) who will aid the patient in the event of a mental health crisis (suicidal ideations/suicide attempt).  With written consent from the patient, the family member/significant other has been provided the following suicide prevention education, prior to the and/or following the discharge of the patient.  The suicide prevention education provided includes the following:  Suicide risk factors  Suicide prevention and interventions  National Suicide Hotline telephone number  [facility] assessment telephone number  [place] Emergency Assistance 911  [facility] and/or Residential Mobile Crisis Unit telephone number  Request made of family/significant other to:  Remove weapons (e.g., guns, rifles, knives), all items previously/currently identified as safety concern.    Remove drugs/medications (over-the-counter, prescriptions, illicit drugs), all items previously/currently identified as a safety concern.  The family member/significant other verbalizes understanding of the suicide prevention education information provided.  The family member/significant other agrees to remove the items of safety concern listed above.  Elaina HoopsCarter, Akeyla Molden M 07/23/2015, 3:49 PM

## 2015-07-23 NOTE — Progress Notes (Signed)
D: Patient presents with irritable mood.  He reports poor sleep.  He is upset because the MD cut his ambien dosage in half.  He states, "I'm taking oxy, so he don't want me to take a full dose of ambien but I only slept 3 hours last night."  His goal is to "continue to see information and skill around anger management."  Patient is interested in outpatient treatment after discharge.  He is requesting his testosterone gel which pharmacy will put in today.  Patient requests oxy q6h for pain management of lower pain.  Patient denies SI/HI/AVH.  He presents with flat, blunted affect. A: Continue to monitor medication management and MD orders.  Safety checks completed every 15 minutes per protocol.  Offer support and encouragement as needed. R: Patient is receptive to staff; his behavior is appropriate.

## 2015-07-23 NOTE — [facility] Group Notes (Signed)
The focus of this group is to educate the patient on the purpose and policies of crisis stabilization and provide a format to answer questions about their admission.  The group details unit policies and expectations of patients while admitted.  Patient did not attend 0900 nurse education orientation group this morning.  Patient stayed in bed.   

## 2015-07-23 NOTE — Progress Notes (Signed)
[facility] MD Progress Note  07/23/2015 6:24 PM Shawn Cervantes  MRN:  [id number] Subjective:  Patient reports ongoing depression, but states he is feeling  A little better today. States he slept poorly last night, in spite of Ambien 5 mgrs QHS. Reports that at home he takes 10 mgrs QHS, and that this dose is well tolerated and allows better quality of sleep. Objective : I have discussed case with treatment team and have met with patient . Patient continues to report depression and dysphoria related to chronic pain. Also, as above, reports poor sleep last night, associated with decreased Ambien dose . He has been visible on the unit, going to some groups.  Denies suicidal ideations, is future oriented, and is hopeful that he will be able to get a spinal cord stimulator placed to alleviate his pain, at which time he stats he will taper off opiates .  He is hoping to be discharged home soon.  Principal Problem: Severe episode of recurrent major depressive disorder, without psychotic features ([place]) Diagnosis:   Patient Active Problem List   Diagnosis Date Noted  . Severe episode of recurrent major depressive disorder, without psychotic features ([place]) [F33.2]   . Bipolar affective disorder, current episode mixed ([place]) [F31.60] 07/21/2015  . Wound dehiscence [T81.31XA] 07/30/2014  . S/P lumbar spinal fusion [Z98.1] 07/17/2014  . HNP (herniated nucleus pulposus), lumbar [M51.26] 04/01/2013    Class: Diagnosis of  . 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 Complication of anesthesia   . PONV (postoperative nausea and vomiting)   . Anxiety   . Mental disorder     Bipolar  . Depression     bi polar  . ADHD (attention deficit hyperactivity disorder)   . Head injury     as a child ;had stitches in head  . Constipation   . Testosterone deficiency   . Arthritis    knee and back  . Bipolar 1 disorder [facility])     Past Surgical History  Procedure Laterality Date  . Hand surgery Left     tendon repai x2  . Microdiscectomy lumbar      L4-5  . Wrist surgery Right     reconstution  . Wrist fusion Right 1998  . Wrist arthroscopy    . Vasectomy    . Lumbar laminectomy Right 04/01/2013    Procedure: MICRODISCECTOMY LUMBAR LAMINECTOMY;  Surgeon: Marybelle Killings, MD;  Location: [place];  Service: Orthopedics;  Laterality: Right;  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  . Microdiscectomy lumbar      L4-5  . Lumbar wound debridement N/A 07/30/2014    Procedure: LUMBAR WOUND IRRIGATION AND DEBRIDEMENT,        ;  Surgeon: Eustace Moore, MD;  Location: [place] [facility];  Service: Neurosurgery;  Laterality: N/A;  . Transforaminal lumbar interbody fusion (tlif) with pedicle screw fixation 1 level N/A 07/17/2014    Procedure: Transforaminal Lumbar Interbody Fusion Lumbar four-five with pedicle screws  ;  Surgeon: Eustace Moore, MD;  Location: [place] [facility];  Service: Neurosurgery;  Laterality: N/A;   Family History: History reviewed. No pertinent family history.  Social History:  History  Alcohol Use No     History  Drug Use No    Social History   Social History  . Marital Status: Married    Spouse Name: N/A  .  Number of Children: N/A  . Years of Education: N/A   Social History Main Topics  . Smoking status: Former Smoker -- 0.00 packs/day    Types: Cigars    Quit date: 10/14/2013  . Smokeless tobacco: Never Used  . Alcohol Use: No  . Drug Use: No  . Sexual Activity: Not Asked     Comment: 1- 2 a week   Other Topics Concern  . None   Social History Narrative   Additional Social History:   Sleep: Poor  Appetite:  Fair  Current Medications: Current Facility-Administered Medications  Medication Dose Route Frequency Provider Last Rate Last Dose  . acetaminophen (TYLENOL) tablet 650 mg  650 mg Oral Q6H PRN Patrecia Pour, NP   650 mg at 07/22/15  1624  . alum & mag hydroxide-simeth (MAALOX/MYLANTA) 200-200-20 MG/5ML suspension 30 mL  30 mL Oral Q4H PRN Patrecia Pour, NP      . cyclobenzaprine (FLEXERIL) tablet 10 mg  10 mg Oral TID PRN Jenne Campus, MD   10 mg at 07/23/15 0440  . DULoxetine (CYMBALTA) DR capsule 20 mg  20 mg Oral Daily Jenne Campus, MD   20 mg at 07/23/15 0818  . gabapentin (NEURONTIN) capsule 600 mg  600 mg Oral BID Patrecia Pour, NP   600 mg at 07/23/15 1645  . lamoTRIgine (LAMICTAL) tablet 100 mg  100 mg Oral BID Patrecia Pour, NP   100 mg at 07/23/15 1645  . magnesium hydroxide (MILK OF MAGNESIA) suspension 30 mL  30 mL Oral Daily PRN Patrecia Pour, NP   30 mL at 07/22/15 2228  . oxyCODONE (Oxy IR/ROXICODONE) immediate release tablet 10 mg  10 mg Oral Q6H PRN Encarnacion Slates, NP   10 mg at 07/23/15 1645  . testosterone (ANDROGEL) 50 MG/5GM (1%) gel 5 g  5 g Transdermal Daily Myer Peer Cyle Kenyon, MD   5 g at 07/23/15 1817  . zolpidem (AMBIEN) tablet 10 mg  10 mg Oral QHS PRN Jenne Campus, MD        Lab Results: No results found for this or any previous visit (from the past 48 hour(s)).  Blood Alcohol level:  Lab Results  Component Value Date   ETH <5 07/20/2015   [facility]  04/22/2010    <5        LOWEST DETECTABLE LIMIT FOR SERUM ALCOHOL IS 5 mg/dL FOR MEDICAL PURPOSES ONLY    Physical Findings: AIMS: Facial and Oral Movements Muscles of Facial Expression: None, normal Lips and Perioral Area: None, normal Jaw: None, normal Tongue: None, normal,Extremity Movements Upper (arms, wrists, hands, fingers): None, normal Lower (legs, knees, ankles, toes): None, normal, Trunk Movements Neck, shoulders, hips: None, normal, Overall Severity Severity of abnormal movements (highest score from questions above): None, normal Incapacitation due to abnormal movements: None, normal Patient's awareness of abnormal movements (rate only patient's report): No Awareness, Dental Status Current problems with teeth and/or  dentures?: No Does patient usually wear dentures?: No  CIWA:    COWS:     Musculoskeletal: Strength & Muscle Tone: within normal limits Gait & Station: normal Patient leans: N/A  Psychiatric Specialty Exam: ROS chronic back pain, radiates to leg   Blood pressure 101/70, pulse 80, temperature 97.7 F (36.5 C), temperature source Oral, resp. rate 16, height '6\' 1"'$  (1.854 m), weight 235 lb (106.595 kg), SpO2 99 %.Body mass index is 31.01 kg/(m^2).  General Appearance: Fairly Groomed  Engineer, water::  Good  Speech:  Normal Rate  Volume:  Normal  Mood:  Depressed and vaguely irritable   Affect:  Constricted  Thought Process:  Linear  Orientation:  Full (Time, Place, and Person)  Thought Content:  ruminative about chronic pain, denies hallucinations, no delusions, not internally preoccupied   Suicidal Thoughts:  No- denies suicidal ideations at this time   Homicidal Thoughts:  No  Memory:  recent and remote grossly intact   Judgement:  Other:  improving   Insight:  present   Psychomotor Activity:  Normal  Concentration:  Good  Recall:  Good  Fund of Knowledge:Good  Language: Good  Akathisia:  Negative  Handed:  Right  AIMS (if indicated):     Assets:  Desire for Improvement Resilience  ADL's:  Intact  Cognition: WNL  Sleep:  Number of Hours: 3.75  Assessment - patient presents depressed, vaguely irritable, but behavior calm and in good control. Denies suicidal ideations and is future oriented , expressing optimism that proposed spinal cord stimulator will help alleviate his pain . Slept poorly last night after Ambien dose was tapered down-  at home was taking Ambien 10 mgrs QHS along with opiate analgesics,  without side effects or excessive sedation.  Treatment Plan Summary: Daily contact with patient to assess and evaluate symptoms and progress in treatment, Medication management, Plan inpatient treatment  and medications as below  Encourage group, milieu participation to work  on coping skills and symptom reduction Continue Lamictal 100 mgrs BID for mood disorder Increase Cymbalta to 30 mgrs QDAY for depression, pain Continue Neurontin 600 mgrs BID for pain, anxiety Increase Ambien to 10 mgrs QHS for insomnia  Continue Oxycodone 10 mgrs Q 6 hours PRN for pain   Treatment team working on disposition planning options  Neita Garnet, MD 07/23/2015, 6:24 PM

## 2015-07-23 NOTE — [facility] Group Notes (Signed)
[facility] Group Therapy 07/23/2015 1:15pm  Type of Therapy: Mental Health Association Presentation  Participation Level: Active  Participation Quality: Attentive  Affect: Appropriate  Cognitive: Oriented  Insight: Developing/Improving  Engagement in Therapy: Engaged  Modes of Intervention: Discussion, Education and Socialization  Summary of Progress/Problems: Mental Health Association (MHA) Speaker came to talk about his personal journey with substance abuse and addiction. The pt processed ways by which to relate to the speaker. MHA speaker provided handouts and educational information pertaining to groups and services offered by the MHA. Pt was engaged in speaker's presentation and was receptive to resources provided.    Sharlot Sturkey Carter, LCSWA 07/23/2015 1:42 PM  
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
## 2015-07-24 MED ORDER — DULOXETINE HCL 30 MG PO CPEP: 30.0000 mg | ORAL_CAPSULE | Freq: Every day | ORAL | Status: DC

## 2015-07-24 MED ORDER — CYCLOBENZAPRINE HCL 10 MG PO TABS
10.0000 mg | ORAL_TABLET | Freq: Three times a day (TID) | ORAL | Status: DC | PRN
Start: 2015-07-24 — End: 2021-10-31

## 2015-07-24 MED ORDER — GABAPENTIN 300 MG PO CAPS: 600.0000 mg | ORAL_CAPSULE | Freq: Two times a day (BID) | ORAL | Status: DC

## 2015-07-24 MED ORDER — TESTOSTERONE 50 MG/5GM (1%) TD GEL: 5.0000 g | Freq: Every day | TRANSDERMAL | Status: DC

## 2015-07-24 MED ORDER — LAMOTRIGINE 100 MG PO TABS
100.0000 mg | ORAL_TABLET | Freq: Two times a day (BID) | ORAL | Status: DC
Start: 2015-07-24 — End: 2019-07-24

## 2015-07-24 MED ORDER — ZOLPIDEM TARTRATE 10 MG PO TABS: 10.0000 mg | ORAL_TABLET | Freq: Every evening | ORAL | Status: DC | PRN

## 2015-07-24 MED ORDER — DOCUSATE SODIUM 100 MG PO CAPS: 100.0000 mg | ORAL_CAPSULE | Freq: Two times a day (BID) | ORAL | Status: DC

## 2015-07-24 NOTE — Progress Notes (Signed)
  [facility]  Will you be returning to the same living situation after discharge:  Yes,  patient plans to return home  At discharge, do you have transportation home?: Yes,  wife Do you have the ability to pay for your medications: Yes,  patient will be provided with prescriptions at discharge  Release of information consent forms completed and in the chart;  Patient's signature needed at discharge.  Patient to Follow up at: Follow-up Information    [facility] On 08/03/2015.   Why:  at 4:15pm with Dr. [place] SinkBraden for medication management.   Contact information:   [address], [address] [place], [place] [address] Phone: [phone] Fax: [phone]      Follow up with [facility].   Why:  Social worker unable to reach prior to discharge. Please contact your therapist Ollen GrossJack Hileman at discharge to schedule upcoming therapy appt. Ask about if you are able to schedule appointments for twice a week.    Contact information:   [address], [address],  [place], [place] [address] Phone: [phone]       Next level of care provider has [facility] to [facility] Link:no  Safety Planning and Suicide Prevention discussed: Yes,  with patient and wife  Have you used any form of tobacco in the last 30 days? (Cigarettes, Smokeless Tobacco, Cigars, and/or Pipes): No  Has patient been referred to the Quitline?: N/A patient is not a smoker  Patient has been referred for addiction treatment: Yes  Tricha Ruggirello, [place]ristin L 07/24/2015, 11:31 AM

## 2015-07-24 NOTE — Progress Notes (Signed)
Patient reports feeling better today. "I finally got adequate sleep. I haven't slept more than 4 hours since I can't remember when." States he did awake at 0400 however was able to resume sleep. Rates depression at a 4/10, hopelessness at a 3/10 and anxiety at a 1/10. Continues with chronic back pain rated at a 7/10 however per observation is ambulating somewhat more freely. Reports goal is to "continue group participation and exit plan." Medicated per orders, education provided. Emotional support offered. Self inventory reviewed. Denies SI/HI and remains safe on level III obs.

## 2015-07-24 NOTE — Progress Notes (Signed)
Patient verbalizes readiness for discharge. Follow up plan explained, Rx's given, and only belonging - testosterone gel - returned from locker 12. Letter provided by Tonia GhentKristen, LCSW. Patient verbalizes understanding. Denies SI/HI and assures this Clinical research associatewriter he will seek assistance should that change. Discharged ambulatory and in stable condition to wife.

## 2015-07-24 NOTE — [facility] Suicide Risk Assessment (Signed)
[facility] Discharge Suicide Risk Assessment   Principal Problem: Severe episode of recurrent major depressive disorder, without psychotic features [facility]) Discharge Diagnoses:  Patient Active Problem List   Diagnosis Date Noted  . Severe episode of recurrent major depressive disorder, without psychotic features ([facility]) [F33.2]   . Bipolar affective disorder, current episode mixed ([facility]) [F31.60] 07/21/2015  . Wound dehiscence [T81.31XA] 07/30/2014  . S/P lumbar spinal fusion [Z98.1] 07/17/2014  . HNP (herniated nucleus pulposus), lumbar [M51.26] 04/01/2013    Class: Diagnosis of  . 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 rate 16, height  (1.854 m), weight 235 lb (106.595 kg), SpO2 99 %.Body mass index is 31.01 kg/(m^2).  General Appearance: improved grooming   Eye Contact::  Good  Speech:  Normal Rate409  Volume:  Normal  Mood:  improved mood, states he is feeling much better than prior to admission   Affect:  more reactive   Thought Process:  Linear  Orientation:  Full (Time, Place, and Person)  Thought Content:  denies hallucinations, no delusions , not internally preoccupied   Suicidal Thoughts:  No- denies any suicidal ideations, denies any self injurious ideations   Homicidal Thoughts:  No- denies any homicidal or violent ideations   Memory:  recent and remote grossly intact   Judgement:  Other:  improving   Insight:  improving   Psychomotor Activity:  Normal  Concentration:  Good  Recall:  Good  Fund of Knowledge:Good  Language: Good  Akathisia:  Negative  Handed:  Right  AIMS (if indicated):     Assets:  Communication Skills Desire for  Improvement Housing Resilience  Sleep:  Number of Hours: 6  Cognition: WNL  ADL's:  Intact   Mental Status Per Nursing Assessment::   On Admission:  NA  Demographic Factors:  45 year old married male, employed   Loss Factors: Chronic back pain, depression  Historical Factors: History of depression, history of chronic back pain  Risk Reduction Factors:   Sense of responsibility to family, Living with another person, especially a relative and Positive coping skills or problem solving skills  Continued Clinical Symptoms:  At this time patient reports improved mood and states he is feeling better overall. Affect appears more reactive, brighter, no thought disorder, no suicidal or self injurious ideations, no homicidal ideations, no psychotic symptoms, at this time tolerating medications well, denies side effects Slept better last night  on Ambien 10 mgrs QHS, which was the dose he had been taking prior to admission- denies excessive sedation and at this time presents fully alert and attentive   Cognitive Features That Contribute To Risk:  No gross cognitive deficits noted upon discharge. Is alert , attentive, and oriented x 3   Suicide Risk:  Mild:  Suicidal ideation of limited frequency, intensity, duration, and specificity.  There are no identifiable plans, no associated intent, mild dysphoria and related symptoms, good self-control (both objective and subjective assessment), few other risk factors, and identifiable protective factors, including available and accessible social support.  Follow-up Information    [facility] On 08/03/2015.   Why:  at 4:15pm with Dr. Opdyke [place] Sink for medication management.   Contact information:   [address]  [address] [place], [place] [address] Phone: [phone] Fax: [phone]      Follow up with [facility].   Why:  Social worker unable to reach prior to discharge. Please contact your  therapist Ollen GrossJack Hileman at discharge to schedule upcoming therapy appt. Ask about if you are able to schedule appointments for twice a week.    Contact information:   [address], [address],  [place], [place] [address] Phone: [phone]       Plan Of Care/Follow-up recommendations:  Activity:  as tolerated Diet:  regular Tests:  NA Other:  see belw Patient is requesting discharge and there are no current grounds for involuntary commitment Patient is leaving unit in good spirits, plans to return home Plans to follow up as above We have reviewed medication side effects and potential interactions, precautions regarding potential sedating effects of medications - as noted, denies any side effects at this time Plans to continue following [facility] for treatment of chronic pain. Nehemiah MassedOBOS, Tijana Walder, MD 07/24/2015, 12:54 PM

## 2015-07-24 NOTE — Plan of Care (Signed)
Problem: Diagnosis: Increased Risk For Suicide Attempt Goal: STG-Patient Will Report Suicidal Feelings to Staff Outcome: Progressing Patient denies SI.   Problem: Aggression Towards others,Towards Self, and or Destruction Goal: STG-Patient will comply with prescribed medication regimen (Patient will comply with prescribed medication regimen)  Outcome: Progressing Patient has been med compliant.

## 2015-07-24 NOTE — Discharge Summary (Signed)
Physician Discharge Summary Note  Patient:  Shawn Cervantes is an 45 y.o., male MRN:  [id number] DOB:  November 13, 1970 Patient phone:  [phone] (home)  Patient address:   [address] [place] [address],  Total Time spent with patient: Greater than 30 minutes  Date of Admission:  07/21/2015  Date of Discharge: 07-24-15  Reason for Admission: Worsening symptoms of Bipolar disorder  Principal Problem: Severe episode of recurrent major depressive disorder, without psychotic features [facility])  Discharge Diagnoses: Patient Active Problem List   Diagnosis Date Noted  . Severe episode of recurrent major depressive disorder, without psychotic features (HCC) [F33.2]   . Bipolar affective disorder, current episode mixed (HCC) [F31.60] 07/21/2015  . Wound dehiscence [T81.31XA] 07/30/2014  . S/P lumbar spinal fusion [Z98.1] 07/17/2014  . HNP (herniated nucleus pulposus), lumbar [M51.26] 04/01/2013    Class: Diagnosis of  . 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 Complication of anesthesia   . PONV (postoperative nausea and vomiting)   . Anxiety   . Mental disorder     Bipolar  . Depression     bi polar  . ADHD (attention deficit hyperactivity disorder)   . Head injury     as a child ;had stitches in head  . Constipation   . Testosterone deficiency   . Arthritis     knee and back  . Bipolar 1 disorder [facility])     Past Surgical History  Procedure Laterality Date  . Hand surgery Left     tendon repai x2  . Microdiscectomy lumbar      L4-5  . Wrist surgery Right     reconstution  . Wrist fusion Right 1998  . Wrist arthroscopy    . Vasectomy    . Lumbar laminectomy Right 04/01/2013    Procedure: MICRODISCECTOMY LUMBAR LAMINECTOMY;  Surgeon: Eldred Manges, MD;  Location: [facility] OR;  Service: Orthopedics;  Laterality: Right;  Right L4-5  Microdiscectomy for recurrent HNP  . Microdiscectomy lumbar      L4-5  . Lumbar wound debridement N/A 07/30/2014    Procedure: LUMBAR WOUND IRRIGATION AND DEBRIDEMENT,        ;  Surgeon: Tia Alert, MD;  Location: [facility] OR[facility];  Service: Neurosurgery;  Laterality: N/A;  . Transforaminal lumbar interbody fusion (tlif) with pedicle screw fixation 1 level N/A 07/17/2014    Procedure: Transforaminal Lumbar Interbody Fusion Lumbar four-five with pedicle screws  ;  Surgeon: Tia Alert, MD;  Location: [facility] OR[facility];  Service: Neurosurgery;  Laterality: N/A;   Family History: History reviewed. No pertinent family history.  Family Psychiatric  History: See H&P  Social History:  History  Alcohol Use No     History  Drug Use No    Social History   Social History  . Marital Status: Married    Spouse Name: N/A  . Number of Children: N/A  . Years of Education: N/A   Social History Main Topics  . Smoking status: Former Smoker -- 0.00 packs/day    Types: Cigars    Quit date: 10/14/2013  . Smokeless tobacco: Never Used  . Alcohol Use: No  . Drug Use: No  . Sexual Activity: Not Asked     Comment: 1- 2 a week   Other Topics Concern  . None   Social History Narrative   Hospital Course: 45 year old man,  states he was " spiraling down" recently. States he has chronic pain, and he feels the chronicity and intensity of it has been causing him to feel depressed , and also more irritable. States " I was feeling agitated, felt like I wanted to throw stuff ", so decided to go to the hospital. Reports some passive thoughts of death,but denies any actual suicidal ideations, denies plan or intention of hurting self. Describes neuro vegetative symptoms of depression as below .  Shawn Cervantes was admitted to the [facility] adult unit with complaints of worsening symptoms of depression & recurrent thoughts of death. He cited chronic pain related issues & agitation as the trigger. He was in need of mood stabilization  treatments. During the course of his treatment, he was medicated & discharged on, Duloxetine 30 mg for depression, Gabapentin 300 mg for agitation/pain, Lamictal 100 mg for mood stabilization & Ambien 10 mg insomnia. He was enrolled & participated in the group counseling sessions being offered & held on this unit. He was counseled & learned coping skills that should help him cope better & maintain mood stability after discharge. He was resumed on all his pertinent home medications for the other previously existing medical issues presented. He tolerated his treatment regimen without any adverse effects reported. While his treatment was on going, Shawn Cervantes's improvement was monitored by observation & his daily reports of symptom reduction noted.  His emotional & mental status were monitored by daily self-inventory reports completed by him & the clinical staff.          Shawn Cervantes was evaluated daily by the treatment team for mood stability & the need for continued recovery after discharge. His motivation was an integral factor in his recovery & mood stability. He was offered further treatment options upon discharge & will follow up care on an outpatient psychiatric services as listed below.     Upon discharge, Shawn Cervantes was both mentally & medically stable for discharge. He is currently denying suicidal, homicidal ideation, auditory, visual/tactile hallucinations, delusional thoughts & or paranoia. He left [facility] with all personal belongings in no apparent distress. Transportation per wife.       Physical Findings: AIMS: Facial and Oral Movements Muscles of Facial Expression: None, normal Lips and Perioral Area: None, normal Jaw: None, normal Tongue: None, normal,Extremity Movements Upper (arms, wrists, hands, fingers): None, normal Lower (legs, knees, ankles, toes): None, normal, Trunk Movements Neck, shoulders, hips: None, normal, Overall Severity Severity of abnormal movements (highest score from questions  above): None, normal Incapacitation due to abnormal movements: None, normal Patient's awareness of abnormal movements (rate only patient's report): No Awareness, Dental Status Current problems with teeth and/or dentures?: No Does patient usually wear dentures?: No  CIWA:    COWS:     Musculoskeletal: Strength & Muscle Tone: within normal limits Gait & Station: normal Patient leans: N/A  Psychiatric Specialty Exam: Review of Systems  Constitutional: Negative.   HENT: Negative.   Eyes: Negative.   Respiratory: Negative.   Cardiovascular: Negative.   Gastrointestinal: Negative.   Genitourinary: Negative.   Musculoskeletal: Negative.   Skin: Negative.   Neurological: Negative.   Endo/Heme/Allergies: Negative.   Psychiatric/Behavioral: Positive for depression (Stable). Negative for suicidal ideas, hallucinations, memory loss and substance abuse. The patient has insomnia (Stable). The patient is not nervous/anxious.     Blood pressure 112/70, pulse 89, temperature 97.5 F (36.4 C), temperature source Oral, resp. rate 16, height 6\' 1"  (1.854 m), weight 106.595 kg (235 lb), SpO2 99 %.Body mass index is  31.01 kg/(m^2).  See Md's SRA   Have you used any form of tobacco in the last 30 days? (Cigarettes, Smokeless Tobacco, Cigars, and/or Pipes): No  Has this patient used any form of tobacco in the last 30 days? (Cigarettes, Smokeless Tobacco, Cigars, and/or Pipes): No  Blood Alcohol level:  Lab Results  Component Value Date   ETH <5 07/20/2015   [facility]  04/22/2010    <5        LOWEST DETECTABLE LIMIT FOR SERUM ALCOHOL IS 5 mg/dL FOR MEDICAL PURPOSES ONLY   Metabolic Disorder Labs:  No results found for: HGBA1C, MPG No results found for: PROLACTIN No results found for: CHOL, TRIG, HDL, CHOLHDL, VLDL, LDLCALC  See Psychiatric Specialty Exam and Suicide Risk Assessment completed by Attending Physician prior to discharge.  Discharge destination:  Home  Is patient on multiple  antipsychotic therapies at discharge:  No   Has Patient had three or more failed trials of antipsychotic monotherapy by history:  No  Recommended Plan for Multiple Antipsychotic Therapies: NA    Medication List    STOP taking these medications        gabapentin 600 MG tablet  Commonly known as:  NEURONTIN  Replaced by:  gabapentin 300 MG capsule     LATUDA 20 MG Tabs tablet  Generic drug:  lurasidone      TAKE these medications      Indication   cyclobenzaprine 10 MG tablet  Commonly known as:  FLEXERIL  Take 1 tablet (10 mg total) by mouth 3 (three) times daily as needed for muscle spasms.   Indication:  Muscle Spasm     docusate sodium 100 MG capsule  Commonly known as:  COLACE  Take 1 capsule (100 mg total) by mouth 2 (two) times daily. (May purchase from over the counter at yr pharmacy): For constipation   Indication:  Constipation     DULoxetine 30 MG capsule  Commonly known as:  CYMBALTA  Take 1 capsule (30 mg total) by mouth daily. For depression   Indication:  Major Depressive Disorder     gabapentin 300 MG capsule  Commonly known as:  NEURONTIN  Take 2 capsules (600 mg total) by mouth 2 (two) times daily. For agitation   Indication:  Agitation     lamoTRIgine 100 MG tablet  Commonly known as:  LAMICTAL  Take 1 tablet (100 mg total) by mouth 2 (two) times daily. For mood stabilization   Indication:  Mood stabilization     testosterone 50 MG/5GM (1%) Gel  Commonly known as:  ANDROGEL  Place 5 g onto the skin daily. For hormonal replacement   Indication:  Androgen Deficiency     zolpidem 10 MG tablet  Commonly known as:  AMBIEN  Take 1 tablet (10 mg total) by mouth at bedtime as needed for sleep.   Indication:  Trouble Sleeping       Follow-up Information    Follow up with [facility] On 08/03/2015.   Why:  at 4:15pm with Dr. Williston Sink for medication management.   Contact information:   [address], [address] [place],  [place] [address] Phone: [phone] Fax: [phone]      Follow up with [facility].   Contact information:   [address], [address], [place], [place] [address] Phone: [phone]      Follow-up recommendations: Activity:  As tolerated Diet: As recommended by your primary care doctor. Keep all scheduled follow-up appointments as recommended.  Comments: Take all your medications as prescribed by your mental healthcare provider. Report any adverse effects and or reactions from your medicines to your outpatient provider promptly. Patient is instructed and cautioned to not engage in alcohol and or illegal drug use while on prescription medicines. In the event of worsening symptoms, patient is instructed to call the crisis hotline, 911 and or go to the nearest ED for appropriate evaluation and treatment of symptoms. Follow-up with your primary care provider for your other medical issues, concerns and or health care needs.  Signed:  Sanjuana KavaNwoko, Agnes I, NP, PMHNP, FNP-BC 07/24/2015, 11:09 AM   Patient seen, Suicide Assessment Completed.  Disposition Plan Reviewed

## 2015-07-24 NOTE — Progress Notes (Signed)
Recreation Therapy Notes  Date: 05.12.2017 Time: 9:30am Location: [address] Dayroom   Group Topic: Stress Management  Goal Area(s) Addresses:  Patient will actively participate in stress management techniques presented during session.   Behavioral Response: Did not attend.   Marykay Lexenise L Perez Dirico, LRT/CTRS        Jearl KlinefelterBlanchfield, Tyneisha Hegeman L 07/24/2015 4:06 PM

## 2015-07-24 NOTE — [facility] Group Notes (Signed)
[facility] LCSW Aftercare Discharge Planning Group Note  07/24/2015  8:45 AM  Participation Quality: Did Not Attend. Patient invited to participate but declined.  Chaise Mahabir, MSW, LCSW Clinical Social Worker [facility] [phone]   

## 2015-07-24 NOTE — Progress Notes (Signed)
D: Pt was in his room with visitor upon initial approach.  Pt has depressed affect and mood.  He reports his day was "a little better."  Pt reports his goal is to "be more involved in groups and I did that for the most part."  Pt reports he had a good visit tonight with his wife.  Pt denies SI/HI, denies hallucinations, reports back pain of 5/10.  Pt has been visible in milieu interacting with peers and staff appropriately.  Pt attended evening group.   A:  Met with pt and offered support and encouragement.  Actively listened to pt.  PRN Tylenol offered for back pain, pt refused. PRN medication administered for muscle spasms and sleep. R: Pt is compliant with medications.  Pt verbally contracts for safety.  Will continue to monitor and assess.

## 2015-09-07 ENCOUNTER — Other Ambulatory Visit: Payer: Self-pay | Admitting: Anesthesiology

## 2015-09-22 ENCOUNTER — Encounter (HOSPITAL_COMMUNITY): Payer: Self-pay

## 2015-09-22 ENCOUNTER — Encounter (HOSPITAL_COMMUNITY)
Admission: RE | Admit: 2015-09-22 | Discharge: 2015-09-22 | Disposition: A | Discharge status: Home / Self Care | Payer: BC Managed Care – PPO | Source: Ambulatory Visit | Attending: Anesthesiology | Admitting: Anesthesiology

## 2015-09-22 DIAGNOSIS — M961 Postlaminectomy syndrome, not elsewhere classified: Secondary | ICD-10-CM | POA: Diagnosis not present

## 2015-09-22 DIAGNOSIS — M5416 Radiculopathy, lumbar region: Secondary | ICD-10-CM | POA: Diagnosis not present

## 2015-09-22 DIAGNOSIS — Z87891 Personal history of nicotine dependence: Secondary | ICD-10-CM | POA: Diagnosis not present

## 2015-09-22 DIAGNOSIS — M1990 Unspecified osteoarthritis, unspecified site: Secondary | ICD-10-CM | POA: Diagnosis not present

## 2015-09-22 DIAGNOSIS — F319 Bipolar disorder, unspecified: Secondary | ICD-10-CM | POA: Diagnosis not present

## 2015-09-22 DIAGNOSIS — M545 Low back pain: Secondary | ICD-10-CM | POA: Diagnosis not present

## 2015-09-22 DIAGNOSIS — F418 Other specified anxiety disorders: Secondary | ICD-10-CM | POA: Diagnosis not present

## 2015-09-22 DIAGNOSIS — Y838 Other surgical procedures as the cause of abnormal reaction of the patient, or of later complication, without mention of misadventure at the time of the procedure: Secondary | ICD-10-CM | POA: Diagnosis not present

## 2015-09-22 DIAGNOSIS — G8929 Other chronic pain: Secondary | ICD-10-CM | POA: Diagnosis present

## 2015-09-22 LAB — CBC
HEMATOCRIT: 45.6 % (ref 39.0–52.0)
HEMOGLOBIN: 15.8 g/dL (ref 13.0–17.0)
MCH: 30.8 pg (ref 26.0–34.0)
MCHC: 34.6 g/dL (ref 30.0–36.0)
MCV: 88.9 fL (ref 78.0–100.0)
Platelets: 314 10*3/uL (ref 150–400)
RBC: 5.13 MIL/uL (ref 4.22–5.81)
RDW: 12.7 % (ref 11.5–15.5)
WBC: 5.1 10*3/uL (ref 4.0–10.5)

## 2015-09-22 LAB — BASIC METABOLIC PANEL
ANION GAP: 5 (ref 5–15)
BUN: 10 mg/dL (ref 6–20)
CALCIUM: 8.6 mg/dL — AB (ref 8.9–10.3)
CHLORIDE: 106 mmol/L (ref 101–111)
CO2: 26 mmol/L (ref 22–32)
Creatinine, Ser: 0.92 mg/dL (ref 0.61–1.24)
GFR calc non Af Amer: >60 mL/min (ref >60)
GLUCOSE: 96 mg/dL (ref 65–99)
POTASSIUM: 4 mmol/L (ref 3.5–5.1)
Sodium: 137 mmol/L (ref 135–145)

## 2015-09-22 LAB — SURGICAL PCR SCREEN
MRSA, PCR: NEGATIVE
Staphylococcus aureus: NEGATIVE

## 2015-09-22 NOTE — Progress Notes (Signed)
PCP - Tally Joeavid Swayne Cardiologist - denies  Chest x-ray - 07/15/14 EKG - 07/15/14 - normal EKG, No heart History Stress Test - denies ECHO - denies Cardiac Cath - denies    Patient denies shortness of breath, fever, cough and chest pain at PAT appointment

## 2015-09-22 NOTE — Pre-Procedure Instructions (Signed)
Shawn Cervantes  09/22/2015      [id number] [place], [place] - [address] [place] [address] Phone: [phone] Fax: [phone]    Your procedure is scheduled on Friday July 14th at 944  Report to [facility] Admitting at 0745 A.M.  Call this number if you have problems the morning of surgery:  [phone]   Remember:  Do not eat food or drink liquids after midnight.   Take these medicines the morning of surgery with A SIP OF WATER Acetaminophen (Tylenol), colnazepam (klonopin) if needed, cyclobenzaprine (flexeril) if needed, gabapentin (neurontin), lamotrigine (lamictal), oxycodone HCL if needed  7 days prior to surgery STOP taking any Aspirin, Aleve, Naproxen, Ibuprofen, Motrin, Advil, Goody's, BC's, all herbal medications, fish oil, and all vitamins  DO not take ADHD medication the morning of surgery     Do not wear lotions, powders, or colognes.  You may NOT wear deoderant.  Men may shave face and neck.  Do not bring valuables to the hospital.  [facility] is not responsible for any belongings or valuables.  Contacts, dentures or bridgework may not be worn into surgery.  Leave your suitcase in the car.  After surgery it may be brought to your room.  For patients admitted to the hospital, discharge time will be determined by your treatment team.  Patients discharged the day of surgery will not be allowed to drive home.    Special instructions:  [facility]- Preparing For Surgery  Before surgery, you can play an important role. Because skin is not sterile, your skin needs to be as free of germs as possible. You can reduce the number of germs on your skin by washing with CHG (chlorahexidine gluconate) Soap before surgery.  CHG is an antiseptic cleaner which kills germs and bonds with the skin to continue killing germs even after washing.  Please do not use if you have an allergy to CHG or  antibacterial soaps. If your skin becomes reddened/irritated stop using the CHG.  Do not shave (including legs and underarms) for at least 48 hours prior to first CHG shower. It is OK to shave your face.  Please follow these instructions carefully.   1. Shower the NIGHT BEFORE SURGERY and the MORNING OF SURGERY with CHG.   2. If you chose to wash your hair, wash your hair first as usual with your normal shampoo.  3. After you shampoo, rinse your hair and body thoroughly to remove the shampoo.  4. Use CHG as you would any other liquid soap. You can apply CHG directly to the skin and wash gently with a scrungie or a clean washcloth.   5. Apply the CHG Soap to your body ONLY FROM THE NECK DOWN.  Do not use on open wounds or open sores. Avoid contact with your eyes, ears, mouth and genitals (private parts). Wash genitals (private parts) with your normal soap.  6. Wash thoroughly, paying special attention to the area where your surgery will be performed.  7. Thoroughly rinse your body with warm water from the neck down.  8. DO NOT shower/wash with your normal soap after using and rinsing off the CHG Soap.  9. Pat yourself dry with a CLEAN TOWEL.   10. Wear CLEAN PAJAMAS   11. Place CLEAN SHEETS on your bed the night of your first shower and DO NOT SLEEP WITH PETS.    Day of Surgery:  Do not apply any deodorants/lotions. Please wear clean clothes to the hospital/surgery center.      Please read over the following fact sheets that you were given. Pain Booklet

## 2015-09-24 MED ORDER — CEFAZOLIN SODIUM-DEXTROSE 2-4 GM/100ML-% IV SOLN
2.0000 g | INTRAVENOUS | Status: AC
  Administered 2015-09-25: 2 g via INTRAVENOUS
  Filled 2015-09-24 (×2): qty 100

## 2015-09-24 NOTE — H&P (Signed)
Shawn Cervantes is an 45 y.o. male.   Chief Complaint: back pain with radiation into the lower extremities, right greater than left HPI: The patient with a history of discectomy x2, had worsening back pain and radiculopathy and ultimately underwent L4-5 decompression and fusion in May, 2016. Recently reevaluated by Dr. Yetta Cervantes in May who recommended the spinal cord stimulator as the patient had chronic uncontrolled pain was not responding to medications, physical therapy or other intervention such as chiropractics.  Dr. Yetta Cervantes did not feel he had any further surgical intervention to offer the patient.  Underwent SCS trial and returned stating that he did quite well with the trial.  He reports at least a 60% improvement in his pain.  He states at its worst it has been 3 out of 10.  He states there were 2-3 days where he was able to use little to no medication.  He feels that his lower extremity symptoms have improved significantly.   Past Medical History  Diagnosis Date  . Complication of anesthesia   . PONV (postoperative nausea and vomiting)   . Anxiety   . Mental disorder     Bipolar  . Depression     bi polar  . ADHD (attention deficit hyperactivity disorder)   . Head injury     as a child ;had stitches in head  . Constipation   . Testosterone deficiency   . Arthritis     knee and back  . Bipolar 1 disorder [facility](HCC)     Past Surgical History  Procedure Laterality Date  . Hand surgery Left     tendon repai x2  . Microdiscectomy lumbar      L4-5  . Wrist surgery Right     reconstution  . Wrist fusion Right 1998  . Wrist arthroscopy    . Vasectomy    . Lumbar laminectomy Right 04/01/2013    Procedure: MICRODISCECTOMY LUMBAR LAMINECTOMY;  Surgeon: Eldred MangesMark C Yates, MD;  [facility];  Service: Orthopedics;  Laterality: Right;  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  . Microdiscectomy lumbar      L4-5  . Lumbar wound debridement N/A 07/30/2014    Procedure: LUMBAR WOUND IRRIGATION  AND DEBRIDEMENT,        ;  Surgeon: Tia Alertavid S Kohlmeyer, MD;  Location: [facility];  Service: Neurosurgery;  Laterality: N/A;  . Transforaminal lumbar interbody fusion (tlif) with pedicle screw fixation 1 level N/A 07/17/2014    Procedure: Transforaminal Lumbar Interbody Fusion Lumbar four-five with pedicle screws  ;  Surgeon: Tia Alertavid S Hodgman, MD;  Location: [facility];  Service: Neurosurgery;  Laterality: N/A;    No family history on file. Social History:  reports that he quit smoking about 1 years ago. His smoking use included Cigars. He has never used smokeless tobacco. He reports that he does not drink alcohol or use illicit drugs.  Allergies: No Known Allergies  Medications Prior to Admission  Medication Sig Dispense Refill  . acetaminophen (TYLENOL) 325 MG tablet Take 650 mg by mouth 2 (two) times daily as needed for moderate pain.    Marland Kitchen. amphetamine-dextroamphetamine (ADDERALL) 15 MG tablet Take 15 mg by mouth daily as needed (for ADHD).     Marland Kitchen. asenapine (SAPHRIS) 5 MG SUBL 24 hr tablet Place 5 mg under the tongue once.    . clonazePAM (KLONOPIN) 0.5 MG tablet Take 0.5 mg by mouth 2 (two) times daily as needed for anxiety.     . cyclobenzaprine (FLEXERIL) 10  MG tablet Take 1 tablet (10 mg total) by mouth 3 (three) times daily as needed for muscle spasms. 1 tablet 0  . docusate sodium (COLACE) 100 MG capsule Take 1 capsule (100 mg total) by mouth 2 (two) times daily. (May purchase from over the counter at yr pharmacy): For constipation 30 capsule 0  . gabapentin (NEURONTIN) 600 MG tablet Take 600 mg by mouth 2 (two) times daily.    Marland Kitchen ibuprofen (ADVIL,MOTRIN) 200 MG tablet Take 400 mg by mouth daily as needed for moderate pain.    Marland Kitchen lamoTRIgine (LAMICTAL) 100 MG tablet Take 1 tablet (100 mg total) by mouth 2 (two) times daily. For mood stabilization 60 tablet 0  . Omega-3 Fatty Acids (FISH OIL) 1000 MG CAPS Take 1,000 mg by mouth 2 (two) times daily.    . Oxycodone HCl 10 MG TABS Take 10 mg by  mouth every 6 (six) hours as needed. for pain  0  . Testosterone 10 MG/ACT (2%) GEL Apply 60 mg topically daily. 6 pumps = 10 mg per pump for hormonal replacement    . traMADol (ULTRAM) 50 MG tablet Take 50 mg by mouth 3 (three) times daily as needed for moderate pain.   2  . zolpidem (AMBIEN) 10 MG tablet Take 1 tablet (10 mg total) by mouth at bedtime as needed for sleep. 7 tablet 0    No results found for this or any previous visit (from the past 48 hour(s)). No results found.  Review of Systems  Constitutional: Negative.   HENT: Negative.   Eyes: Negative.   Respiratory: Negative.   Cardiovascular: Negative.   Gastrointestinal: Negative.   Genitourinary: Negative.   Musculoskeletal: Negative.   Skin: Negative.   Neurological: Negative.   Endo/Heme/Allergies: Negative.   Psychiatric/Behavioral: Negative.     Blood pressure 117/71, pulse 75, temperature 98 F (36.7 C), resp. rate 20, height  (1.854 m), weight 106.595 kg (235 lb), SpO2 95 %. Physical Exam  Constitutional: He is oriented to person, place, and time. He appears well-developed and well-nourished.  HENT:  Head: Normocephalic and atraumatic.  Eyes: EOM are normal. Pupils are equal, round, and reactive to light.  Neck: Normal range of motion.  Cardiovascular: Normal rate and regular rhythm.   Respiratory: Effort normal.  Musculoskeletal: Normal range of motion.  Neurological: He is alert and oriented to person, place, and time.  Skin: Skin is warm and dry.  Psychiatric: He has a normal mood and affect. His behavior is normal. Judgment and thought content normal.     Assessment/Plan A: 1) chronic pain; 2) lumbar postlaminectomy syndrome; 3) rightLE radiculopathy PLAN: permanent SCS; [employer]  Gwynne Edinger, MD 09/25/2015, 7:22 AM

## 2015-09-25 ENCOUNTER — Inpatient Hospital Stay (HOSPITAL_COMMUNITY): Payer: BC Managed Care – PPO | Admitting: Anesthesiology

## 2015-09-25 ENCOUNTER — Ambulatory Visit (HOSPITAL_COMMUNITY)
Admission: RE | Admit: 2015-09-25 | Discharge: 2015-09-25 | Disposition: A | Discharge status: Home / Self Care | Payer: BC Managed Care – PPO | Source: Ambulatory Visit | Attending: Anesthesiology | Admitting: Anesthesiology

## 2015-09-25 ENCOUNTER — Encounter (HOSPITAL_COMMUNITY)
Admission: RE | Disposition: A | Discharge status: Home / Self Care | Payer: Self-pay | Source: Ambulatory Visit | Attending: Anesthesiology

## 2015-09-25 ENCOUNTER — Encounter (HOSPITAL_COMMUNITY): Payer: Self-pay | Admitting: *Deleted

## 2015-09-25 ENCOUNTER — Ambulatory Visit (HOSPITAL_COMMUNITY): Payer: BC Managed Care – PPO

## 2015-09-25 DIAGNOSIS — F418 Other specified anxiety disorders: Secondary | ICD-10-CM | POA: Insufficient documentation

## 2015-09-25 DIAGNOSIS — G8929 Other chronic pain: Secondary | ICD-10-CM | POA: Insufficient documentation

## 2015-09-25 DIAGNOSIS — Z419 Encounter for procedure for purposes other than remedying health state, unspecified: Secondary | ICD-10-CM

## 2015-09-25 DIAGNOSIS — M961 Postlaminectomy syndrome, not elsewhere classified: Secondary | ICD-10-CM | POA: Insufficient documentation

## 2015-09-25 DIAGNOSIS — F319 Bipolar disorder, unspecified: Secondary | ICD-10-CM | POA: Insufficient documentation

## 2015-09-25 DIAGNOSIS — M1990 Unspecified osteoarthritis, unspecified site: Secondary | ICD-10-CM | POA: Insufficient documentation

## 2015-09-25 DIAGNOSIS — M545 Low back pain: Secondary | ICD-10-CM | POA: Insufficient documentation

## 2015-09-25 DIAGNOSIS — M5416 Radiculopathy, lumbar region: Secondary | ICD-10-CM | POA: Insufficient documentation

## 2015-09-25 DIAGNOSIS — Z87891 Personal history of nicotine dependence: Secondary | ICD-10-CM | POA: Insufficient documentation

## 2015-09-25 DIAGNOSIS — Y838 Other surgical procedures as the cause of abnormal reaction of the patient, or of later complication, without mention of misadventure at the time of the procedure: Secondary | ICD-10-CM | POA: Insufficient documentation

## 2015-09-25 HISTORY — PX: SPINAL CORD STIMULATOR INSERTION: SHX5378

## 2015-09-25 SURGERY — INSERTION, SPINAL CORD STIMULATOR, LUMBAR
Anesthesia: Monitor Anesthesia Care

## 2015-09-25 MED ORDER — HYDROMORPHONE HCL 1 MG/ML IJ SOLN: 0.5000 mg | INTRAMUSCULAR | Status: DC | PRN

## 2015-09-25 MED ORDER — CHLORHEXIDINE GLUCONATE CLOTH 2 % EX PADS: 6.0000 | MEDICATED_PAD | Freq: Once | CUTANEOUS | Status: DC

## 2015-09-25 MED ORDER — BACITRACIN-NEOMYCIN-POLYMYXIN OINTMENT TUBE
TOPICAL_OINTMENT | CUTANEOUS | Status: DC | PRN
  Administered 2015-09-25: 1 via TOPICAL

## 2015-09-25 MED ORDER — BUPIVACAINE-EPINEPHRINE (PF) 0.5% -1:200000 IJ SOLN
INTRAMUSCULAR | Status: DC | PRN
  Administered 2015-09-25 (×2): 20 mL

## 2015-09-25 MED ORDER — FENTANYL CITRATE (PF) 250 MCG/5ML IJ SOLN
INTRAMUSCULAR | Status: AC
  Filled 2015-09-25: qty 5

## 2015-09-25 MED ORDER — PROPOFOL 10 MG/ML IV BOLUS
INTRAVENOUS | Status: DC | PRN
  Administered 2015-09-25 (×3): 20 mg via INTRAVENOUS
  Administered 2015-09-25: 10 mg via INTRAVENOUS
  Administered 2015-09-25: 30 mg via INTRAVENOUS

## 2015-09-25 MED ORDER — LIDOCAINE 2% (20 MG/ML) 5 ML SYRINGE
INTRAMUSCULAR | Status: AC
  Filled 2015-09-25: qty 10

## 2015-09-25 MED ORDER — MIDAZOLAM HCL 2 MG/2ML IJ SOLN
INTRAMUSCULAR | Status: AC
  Filled 2015-09-25: qty 2

## 2015-09-25 MED ORDER — OXYCODONE HCL 10 MG PO TABS: 10.0000 mg | ORAL_TABLET | ORAL | Status: DC | PRN

## 2015-09-25 MED ORDER — ROCURONIUM BROMIDE 50 MG/5ML IV SOLN
INTRAVENOUS | Status: AC
  Filled 2015-09-25: qty 1

## 2015-09-25 MED ORDER — CEPHALEXIN 500 MG PO CAPS: 500.0000 mg | ORAL_CAPSULE | Freq: Three times a day (TID) | ORAL | Status: DC

## 2015-09-25 MED ORDER — ONDANSETRON HCL 4 MG/2ML IJ SOLN
INTRAMUSCULAR | Status: DC | PRN
Start: 2015-09-25 — End: 2015-09-25
  Administered 2015-09-25: 4 mg via INTRAVENOUS

## 2015-09-25 MED ORDER — LIDOCAINE HCL (CARDIAC) 20 MG/ML IV SOLN
INTRAVENOUS | Status: DC | PRN
  Administered 2015-09-25: 20 mg via INTRAVENOUS
  Administered 2015-09-25: 30 mg via INTRAVENOUS

## 2015-09-25 MED ORDER — SODIUM CHLORIDE 0.9 % IR SOLN
Status: DC | PRN
  Administered 2015-09-25: 500 mL

## 2015-09-25 MED ORDER — PROPOFOL 500 MG/50ML IV EMUL
INTRAVENOUS | Status: DC | PRN
  Administered 2015-09-25: 75 ug/kg/min via INTRAVENOUS

## 2015-09-25 MED ORDER — PROPOFOL 10 MG/ML IV BOLUS
INTRAVENOUS | Status: AC
  Filled 2015-09-25: qty 40

## 2015-09-25 MED ORDER — LACTATED RINGERS IV SOLN
INTRAVENOUS | Status: DC | PRN
  Administered 2015-09-25 (×2): via INTRAVENOUS

## 2015-09-25 MED ORDER — 0.9 % SODIUM CHLORIDE (POUR BTL) OPTIME
TOPICAL | Status: DC | PRN
  Administered 2015-09-25: 1000 mL

## 2015-09-25 MED ORDER — FENTANYL CITRATE (PF) 100 MCG/2ML IJ SOLN
INTRAMUSCULAR | Status: DC | PRN
  Administered 2015-09-25 (×3): 50 ug via INTRAVENOUS

## 2015-09-25 MED ORDER — ONDANSETRON HCL 4 MG/2ML IJ SOLN: 4.0000 mg | Freq: Once | INTRAMUSCULAR | Status: DC | PRN

## 2015-09-25 MED ORDER — MIDAZOLAM HCL 5 MG/5ML IJ SOLN
INTRAMUSCULAR | Status: DC | PRN
  Administered 2015-09-25: 2 mg via INTRAVENOUS

## 2015-09-25 SURGICAL SUPPLY — 68 items
ANCH LD 4 SETX2 CLIK X (Stimulator) ×1 IMPLANT
ANCHOR CLIK X NEURO (Stimulator) ×1 IMPLANT
APL SKNCLS STERI-STRIP NONHPOA (GAUZE/BANDAGES/DRESSINGS)
BAG DECANTER FOR FLEXI CONT (MISCELLANEOUS) ×2 IMPLANT
BENZOIN TINCTURE PRP APPL 2/3 (GAUZE/BANDAGES/DRESSINGS) IMPLANT
BINDER ABDOMINAL 12 ML 46-62 (SOFTGOODS) ×2 IMPLANT
BLADE CLIPPER SURG (BLADE) IMPLANT
CABLE OR STIMULATOR 2X8 61 (WIRE) ×2 IMPLANT
CHLORAPREP W/TINT 26ML (MISCELLANEOUS) ×2 IMPLANT
CLIP TI WIDE RED SMALL 6 (CLIP) IMPLANT
DRAPE C-ARM 42X72 X-RAY (DRAPES) ×2 IMPLANT
DRAPE C-ARMOR (DRAPES) ×2 IMPLANT
DRAPE LAPAROTOMY 100X72X124 (DRAPES) ×2 IMPLANT
DRAPE POUCH INSTRU U-SHP 10X18 (DRAPES) ×2 IMPLANT
DRAPE SURG 17X23 STRL (DRAPES) ×2 IMPLANT
DRSG OPSITE POSTOP 3X4 (GAUZE/BANDAGES/DRESSINGS) IMPLANT
DRSG OPSITE POSTOP 4X6 (GAUZE/BANDAGES/DRESSINGS) ×1 IMPLANT
DRSG OPSITE POSTOP 4X8 (GAUZE/BANDAGES/DRESSINGS) ×1 IMPLANT
ELECT REM PT RETURN 9FT ADLT (ELECTROSURGICAL) ×2
ELECTRODE REM PT RTRN 9FT ADLT (ELECTROSURGICAL) ×1 IMPLANT
GAUZE SPONGE 4X4 16PLY XRAY LF (GAUZE/BANDAGES/DRESSINGS) ×2 IMPLANT
GLOVE BIOGEL PI IND STRL 7.5 (GLOVE) ×1 IMPLANT
GLOVE BIOGEL PI INDICATOR 7.5 (GLOVE) ×1
GLOVE ECLIPSE 7.5 STRL STRAW (GLOVE) ×2 IMPLANT
GLOVE EXAM NITRILE LRG STRL (GLOVE) IMPLANT
GLOVE EXAM NITRILE MD LF STRL (GLOVE) IMPLANT
GLOVE EXAM NITRILE XL STR (GLOVE) IMPLANT
GLOVE EXAM NITRILE XS STR PU (GLOVE) IMPLANT
GOWN STRL REUS W/ TWL LRG LVL3 (GOWN DISPOSABLE) IMPLANT
GOWN STRL REUS W/ TWL XL LVL3 (GOWN DISPOSABLE) IMPLANT
GOWN STRL REUS W/TWL 2XL LVL3 (GOWN DISPOSABLE) IMPLANT
GOWN STRL REUS W/TWL LRG LVL3 (GOWN DISPOSABLE)
GOWN STRL REUS W/TWL XL LVL3 (GOWN DISPOSABLE)
IPG PRECISION SPECTRA (Stimulator) ×1 IMPLANT
KIT BASIN OR (CUSTOM PROCEDURE TRAY) ×2 IMPLANT
KIT CHARGING (KITS) ×1
KIT CHARGING PRECISION NEURO (KITS) IMPLANT
KIT PAT PROGRAM FREELINK (KITS) IMPLANT
KIT ROOM TURNOVER OR (KITS) ×2 IMPLANT
LEAD INFINION CX PERC 70CM (Cable) ×2 IMPLANT
LIQUID BAND (GAUZE/BANDAGES/DRESSINGS) ×2 IMPLANT
NDL 18GX1X1/2 (RX/OR ONLY) (NEEDLE) IMPLANT
NDL ENTRADA 4.5IN (NEEDLE) IMPLANT
NDL HYPO 25X1 1.5 SAFETY (NEEDLE) ×1 IMPLANT
NEEDLE 18GX1X1/2 (RX/OR ONLY) (NEEDLE) IMPLANT
NEEDLE ENTRADA 4.5IN (NEEDLE) ×4 IMPLANT
NEEDLE HYPO 25X1 1.5 SAFETY (NEEDLE) ×2 IMPLANT
NS IRRIG 1000ML POUR BTL (IV SOLUTION) ×2 IMPLANT
PACK LAMINECTOMY NEURO (CUSTOM PROCEDURE TRAY) ×2 IMPLANT
PAD ARMBOARD 7.5X6 YLW CONV (MISCELLANEOUS) ×2 IMPLANT
REMOTE CONTROL KIT (KITS) ×2
SPONGE LAP 4X18 X RAY DECT (DISPOSABLE) ×2 IMPLANT
SPONGE SURGIFOAM ABS GEL SZ50 (HEMOSTASIS) IMPLANT
STAPLER SKIN PROX WIDE 3.9 (STAPLE) ×2 IMPLANT
STRIP CLOSURE SKIN 1/2X4 (GAUZE/BANDAGES/DRESSINGS) IMPLANT
SUT MNCRL AB 4-0 PS2 18 (SUTURE) IMPLANT
SUT SILK 0 (SUTURE) ×2
SUT SILK 0 MO-6 18XCR BRD 8 (SUTURE) ×1 IMPLANT
SUT SILK 0 TIES 10X30 (SUTURE) IMPLANT
SUT SILK 2 0 TIES 10X30 (SUTURE) IMPLANT
SUT VIC AB 2-0 CP2 18 (SUTURE) ×5 IMPLANT
SYR EPIDURAL 5ML GLASS (SYRINGE) ×2 IMPLANT
SYRINGE 10CC LL (SYRINGE) IMPLANT
TOOL LONG TUNNEL (SPINAL CORD STIMULATOR) ×1 IMPLANT
TOWEL OR 17X24 6PK STRL BLUE (TOWEL DISPOSABLE) ×2 IMPLANT
TOWEL OR 17X26 10 PK STRL BLUE (TOWEL DISPOSABLE) ×2 IMPLANT
WATER STERILE IRR 1000ML POUR (IV SOLUTION) ×2 IMPLANT
YANKAUER SUCT BULB TIP NO VENT (SUCTIONS) ×2 IMPLANT

## 2015-09-25 NOTE — Transfer of Care (Signed)
Immediate Anesthesia Transfer of Care Note  Patient: Shawn Cervantes  Procedure(s) Performed: Procedure(s) with comments: LUMBAR SPINAL CORD STIMULATOR INSERTION (N/A) - LUMBAR SPINAL CORD STIMULATOR INSERTION  Patient Location: PACU  Anesthesia Type:MAC  Level of Consciousness: awake, alert , oriented and sedated  Airway & Oxygen Therapy: Patient Spontanous Breathing  Post-op Assessment: Report given to RN, Post -op Vital signs reviewed and stable and Patient moving all extremities  Post vital signs: Reviewed and stable  Last Vitals:  Filed Vitals:   09/25/15 0554  BP: 117/71  Pulse: 75  Temp: 36.7 C  Resp: 20    Last Pain: There were no vitals filed for this visit.       Complications: No apparent anesthesia complications

## 2015-09-25 NOTE — Anesthesia Postprocedure Evaluation (Signed)
Anesthesia Post Note  Patient: Shawn Cervantes  Procedure(s) Performed: Procedure(s) (LRB): LUMBAR SPINAL CORD STIMULATOR INSERTION (N/A)  Patient location during evaluation: PACU Anesthesia Type: MAC Level of consciousness: awake, oriented and patient cooperative Pain management: pain level controlled Vital Signs Assessment: post-procedure vital signs reviewed and stable Respiratory status: spontaneous breathing and respiratory function stable Cardiovascular status: blood pressure returned to baseline and stable Anesthetic complications: no    Last Vitals:  Filed Vitals:   09/25/15 0554 09/25/15 0901  BP: 117/71 140/100  Pulse: 75 76  Temp: 36.7 C 36 C  Resp: 20 28    Last Pain: There were no vitals filed for this visit.               Estellar Cadena EDWARD

## 2015-09-25 NOTE — Op Note (Signed)
PREOP DX: 1) lumbago  2) lumbar radiculopathy  3) lumbar post-laminectomy syndrome  4) chronic pain  POSTOP DX: 1) lumbago  2) lumbar radiculopathy  3) lumbar post-laminectomy syndrome  4) chronic pain PROCEDURES PERFORMED:1) intraop fluoro 2) placement of 2 16 contact [employer] Infinion leads 3) placement of Spectra SCS generator  SURGEON:Olamide Lahaie  ASSISTANT: NONE  ANESTHESIA: MAC  EBL: <20cc  DESCRIPTION OF PROCEDURE: After a discussion of risks, benefits and alternatives, informed consent was obtained. The patient was taken to the OR, turned prone onto a Jackson table, all pressure points padded, SCD's placed, and an adequate plane of anesthesia induced. A timeout was taken to verify the correct patient, position, personnel, availability of appropriate equipment, and administration of perioperative antibiotics.  The thoracic and lumbar areas were widely prepped with chloraprep and draped into a sterile field. Fluoroscopy was used to plan a right paramedian incision at the L1-L3 levels, and an incision made with a 10 blade and carried down to the dorsolumbar fascia with the bovie and blunt dissection. Retractors were placed and a 14g [employer] tuohy needle placed into the epidural space at the T12-L1 interspace using biplanar fluoro and loss-of-resistance technique. The needle was aspirated without any return of fluid. A [employer] INFINION lead was introduced and under live AP fluoro advanced until the distal-most contact overlay the midportion of the T7 vertebral body shadow with the rest of the contacts distributed over the T8 and T9  vertebral bodies in a position just right of anatomic midline. A second Infinion lead was placed just immediately left of anatomic midline in the same levels using the same technique. The patient was awakened and  the leads tested; impedances were good, and the patient reported good coverage with amplitudes in the 5-7 mA range. 0 silk sutures were placed in the fascia adjacent to the needles. The needles and stylets were removed under fluoroscopy with no lead migration noted. Leads were then fixed to the fascia by fixation into position with the clik anchors; repeat images were obtained to verify that there had been no lead migration.   The incision was inspected and hemostasis obtained with the bipolar cautery.   Attention was then turned to creation of a subcutaneous pocket. At the right flank, a 3 cm incision was made with a 10 blade and using the bovie and blunt dissection a pocket of size appropriate to place a SCS generator. The pocket was trialed, and found to be of adequate size. The pocket was inspected for hemostasis, which was found to be excellent. Using reverse seldinger technique, the leads were tunneled to the pocket site, and the leads inserted into the SCS generator. Impedances were checked, and all found to be excellent. The leads were then all fixed into position with a self-torquing wrench. The wiring was all carefully coiled, placed behind the generator and placed in the pocket.  Both incisions were copiously irrigated with bacitracin-containing irrigation. The lumbar incision was closed in 2 deep layers of interrupted 2-0 vicryl and the skin closed with staples. The pocket incision was closed with a deeper layer of 2-0 vicryl interrupted sutures, and the skin closed with staples. Sterile dressings were applied. Needle, sponge, and instrument counts were correct x2 at the end of the case.  The patient was then carefully awakened from anesthesia, turned supine, an abdominal binder placed, and the patient taken to the recovery room where he underwent complex spinal cord stimulator programming.  COMPLICATIONS: NONE  CONDITION: Stable throughout the  course of the procedure and immediately  afterward  DISPOSITION: discharge to home, with antibiotics and pain medicine. Discussed care with the patient and spouse. Followup in clinic will be scheduled in 10-14 days

## 2015-09-25 NOTE — Anesthesia Procedure Notes (Signed)
Procedure Name: MAC Date/Time: 09/25/2015 7:25 AM Performed by: Fransisca KaufmannMEYER, Marquia Costello E Pre-anesthesia Checklist: Patient identified, Emergency Drugs available, Suction available, Patient being monitored and Timeout performed Patient Re-evaluated:Patient Re-evaluated prior to inductionOxygen Delivery Method: Simple face mask Intubation Type: IV induction

## 2015-09-25 NOTE — Discharge Instructions (Signed)
Dr. Jerrie Schussler Post-Op Orders ° °• Ice Pack - 20 minutes on (in a pillow case), and 20 minutes off. Wear the ice pack UNDER the binder. °• Follow up in office, they will call you for an appointment in 10 days to 2 weeks. °• Increase activity gradually.   °• No lifting anything heavier than a gallon of milk (10 pounds) until seen in the office. °• Advance diet slowly as tolerated. °• Dressing care:  Keep dressing dry for 3 days, and on Post-op day 4, may shower. °• Call for fever, drainage, and redness. °• No swimming or bathing in a bathtub (do not get into standing water). °•  °

## 2015-09-25 NOTE — Anesthesia Preprocedure Evaluation (Signed)
Anesthesia Evaluation  Patient identified by MRN, date of birth, ID band Patient awake    Reviewed: Allergy & Precautions, NPO status , Patient's Chart, lab work & pertinent test results  History of Anesthesia Complications (+) PONV  Airway Mallampati: I  TM Distance: >3 FB     Dental   Pulmonary former smoker,    Pulmonary exam normal        Cardiovascular Normal cardiovascular exam     Neuro/Psych Anxiety Depression Bipolar Disorder    GI/Hepatic   Endo/Other    Renal/GU      Musculoskeletal  (+) Arthritis ,   Abdominal   Peds  Hematology   Anesthesia Other Findings   Reproductive/Obstetrics                             Anesthesia Physical Anesthesia Plan  ASA: II  Anesthesia Plan: General   Post-op Pain Management:    Induction: Intravenous  Airway Management Planned: Oral ETT  Additional Equipment:   Intra-op Plan:   Post-operative Plan: Extubation in OR  Informed Consent: I have reviewed the patients History and Physical, chart, labs and discussed the procedure including the risks, benefits and alternatives for the proposed anesthesia with the patient or authorized representative who has indicated his/her understanding and acceptance.     Plan Discussed with: CRNA, Anesthesiologist and Surgeon  Anesthesia Plan Comments:         Anesthesia Quick Evaluation

## 2015-09-28 ENCOUNTER — Encounter (HOSPITAL_COMMUNITY): Payer: Self-pay | Admitting: Anesthesiology

## 2016-01-19 ENCOUNTER — Other Ambulatory Visit: Payer: Self-pay | Admitting: Neurological Surgery

## 2016-01-19 DIAGNOSIS — M961 Postlaminectomy syndrome, not elsewhere classified: Secondary | ICD-10-CM

## 2016-01-22 ENCOUNTER — Ambulatory Visit
Admission: RE | Admit: 2016-01-22 | Discharge: 2016-01-22 | Disposition: A | Discharge status: Home / Self Care | Payer: BC Managed Care – PPO | Source: Ambulatory Visit | Attending: Neurological Surgery | Admitting: Neurological Surgery

## 2016-01-22 DIAGNOSIS — M961 Postlaminectomy syndrome, not elsewhere classified: Secondary | ICD-10-CM

## 2016-08-02 ENCOUNTER — Emergency Department (HOSPITAL_COMMUNITY): Payer: BC Managed Care – PPO

## 2016-08-02 ENCOUNTER — Emergency Department (HOSPITAL_COMMUNITY)
Admission: EM | Admit: 2016-08-02 | Discharge: 2016-08-02 | Disposition: A | Discharge status: Home / Self Care | Payer: BC Managed Care – PPO | Attending: Emergency Medicine | Admitting: Emergency Medicine

## 2016-08-02 ENCOUNTER — Encounter (HOSPITAL_COMMUNITY): Payer: Self-pay | Admitting: Emergency Medicine

## 2016-08-02 DIAGNOSIS — J0141 Acute recurrent pansinusitis: Secondary | ICD-10-CM

## 2016-08-02 DIAGNOSIS — F17290 Nicotine dependence, other tobacco product, uncomplicated: Secondary | ICD-10-CM | POA: Diagnosis not present

## 2016-08-02 DIAGNOSIS — F909 Attention-deficit hyperactivity disorder, unspecified type: Secondary | ICD-10-CM | POA: Insufficient documentation

## 2016-08-02 DIAGNOSIS — I10 Essential (primary) hypertension: Secondary | ICD-10-CM | POA: Diagnosis not present

## 2016-08-02 DIAGNOSIS — R51 Headache: Secondary | ICD-10-CM | POA: Diagnosis present

## 2016-08-02 DIAGNOSIS — F3170 Bipolar disorder, currently in remission, most recent episode unspecified: Secondary | ICD-10-CM

## 2016-08-02 DIAGNOSIS — E871 Hypo-osmolality and hyponatremia: Secondary | ICD-10-CM | POA: Diagnosis present

## 2016-08-02 DIAGNOSIS — R42 Dizziness and giddiness: Secondary | ICD-10-CM | POA: Diagnosis not present

## 2016-08-02 DIAGNOSIS — Z79899 Other long term (current) drug therapy: Secondary | ICD-10-CM | POA: Insufficient documentation

## 2016-08-02 DIAGNOSIS — H538 Other visual disturbances: Secondary | ICD-10-CM | POA: Insufficient documentation

## 2016-08-02 DIAGNOSIS — F3160 Bipolar disorder, current episode mixed, unspecified: Secondary | ICD-10-CM | POA: Diagnosis present

## 2016-08-02 LAB — BASIC METABOLIC PANEL
Anion gap: 8 (ref 5–15)
Anion gap: 7 (ref 5–15)
BUN: 7 mg/dL (ref 6–20)
BUN: 6 mg/dL (ref 6–20)
CALCIUM: 8.7 mg/dL — AB (ref 8.9–10.3)
CALCIUM: 8.3 mg/dL — AB (ref 8.9–10.3)
CO2: 24 mmol/L (ref 22–32)
CO2: 25 mmol/L (ref 22–32)
CREATININE: 0.7 mg/dL (ref 0.61–1.24)
CREATININE: 0.72 mg/dL (ref 0.61–1.24)
Chloride: 90 mmol/L — ABNORMAL LOW (ref 101–111)
Chloride: 92 mmol/L — ABNORMAL LOW (ref 101–111)
GFR calc Af Amer: >60 mL/min (ref >60)
Glucose, Bld: 104 mg/dL — ABNORMAL HIGH (ref 65–99)
Glucose, Bld: 115 mg/dL — ABNORMAL HIGH (ref 65–99)
Potassium: 3.8 mmol/L (ref 3.5–5.1)
Potassium: 3.6 mmol/L (ref 3.5–5.1)
SODIUM: 122 mmol/L — AB (ref 135–145)
SODIUM: 124 mmol/L — AB (ref 135–145)

## 2016-08-02 LAB — URINALYSIS, ROUTINE W REFLEX MICROSCOPIC
BILIRUBIN URINE: NEGATIVE
Glucose, UA: NEGATIVE mg/dL
HGB URINE DIPSTICK: NEGATIVE
KETONES UR: 5 mg/dL — AB
Leukocytes, UA: NEGATIVE
NITRITE: NEGATIVE
PROTEIN: NEGATIVE mg/dL
SPECIFIC GRAVITY, URINE: 1.012 (ref 1.005–1.030)
pH: 7 (ref 5.0–8.0)

## 2016-08-02 LAB — CBC WITH DIFFERENTIAL/PLATELET
Basophils Absolute: 0 10*3/uL (ref 0.0–0.1)
Basophils Relative: 0 %
EOS ABS: 0 10*3/uL (ref 0.0–0.7)
EOS PCT: 1 %
HCT: 42.5 % (ref 39.0–52.0)
HEMOGLOBIN: 15.4 g/dL (ref 13.0–17.0)
LYMPHS ABS: 1.3 10*3/uL (ref 0.7–4.0)
Lymphocytes Relative: 24 %
MCH: 31.2 pg (ref 26.0–34.0)
MCHC: 36.2 g/dL — AB (ref 30.0–36.0)
MCV: 86 fL (ref 78.0–100.0)
MONOS PCT: 8 %
Monocytes Absolute: 0.4 10*3/uL (ref 0.1–1.0)
NEUTROS PCT: 67 %
Neutro Abs: 3.7 10*3/uL (ref 1.7–7.7)
PLATELETS: 262 10*3/uL (ref 150–400)
RBC: 4.94 MIL/uL (ref 4.22–5.81)
RDW: 12.3 % (ref 11.5–15.5)
WBC: 5.5 10*3/uL (ref 4.0–10.5)

## 2016-08-02 LAB — SODIUM, URINE, RANDOM: Sodium, Ur: 71 mmol/L

## 2016-08-02 LAB — OSMOLALITY: Osmolality: 258 mOsm/kg — ABNORMAL LOW (ref 275–295)

## 2016-08-02 LAB — OSMOLALITY, URINE: OSMOLALITY UR: 380 mosm/kg (ref 300–900)

## 2016-08-02 MED ORDER — HYDRALAZINE HCL 20 MG/ML IJ SOLN
10.0000 mg | Freq: Once | INTRAMUSCULAR | Status: DC
  Filled 2016-08-02: qty 1

## 2016-08-02 MED ORDER — MECLIZINE HCL 25 MG PO TABS: 50.0000 mg | ORAL_TABLET | Freq: Three times a day (TID) | ORAL | 0 refills | Status: DC | PRN

## 2016-08-02 MED ORDER — PREDNISONE 20 MG PO TABS: ORAL_TABLET | ORAL | 0 refills | Status: DC

## 2016-08-02 MED ORDER — MECLIZINE HCL 25 MG PO TABS: 25.0000 mg | ORAL_TABLET | Freq: Once | ORAL | Status: DC

## 2016-08-02 MED ORDER — SODIUM CHLORIDE 0.9 % IV BOLUS (SEPSIS)
1000.0000 mL | Freq: Once | INTRAVENOUS | Status: AC
  Administered 2016-08-02: 1000 mL via INTRAVENOUS

## 2016-08-02 MED ORDER — AMOXICILLIN-POT CLAVULANATE 875-125 MG PO TABS: 1.0000 | ORAL_TABLET | Freq: Two times a day (BID) | ORAL | 0 refills | Status: DC

## 2016-08-02 MED ORDER — ONDANSETRON 4 MG PO TBDP: 4.0000 mg | ORAL_TABLET | Freq: Three times a day (TID) | ORAL | 0 refills | Status: DC | PRN

## 2016-08-02 MED ORDER — ONDANSETRON HCL 4 MG/2ML IJ SOLN
4.0000 mg | Freq: Once | INTRAMUSCULAR | Status: AC
  Administered 2016-08-02: 4 mg via INTRAVENOUS
  Filled 2016-08-02: qty 2

## 2016-08-02 MED ORDER — MECLIZINE HCL 25 MG PO TABS
50.0000 mg | ORAL_TABLET | Freq: Once | ORAL | Status: AC
  Administered 2016-08-02: 50 mg via ORAL
  Filled 2016-08-02: qty 2

## 2016-08-02 NOTE — ED Notes (Signed)
Pt staes he understands instructions. Home stable with steady gait with wife.

## 2016-08-02 NOTE — ED Notes (Signed)
Pt returns from ct and denies pain but states he had some spinning sensation while wc was moving

## 2016-08-02 NOTE — ED Notes (Signed)
Pt ambulates in hall with steady gait and state he feels much better.

## 2016-08-02 NOTE — ED Notes (Signed)
Dr. Konrad DoloresMerrell at bedside. States to hold hydralazine at current bp.

## 2016-08-02 NOTE — ED Provider Notes (Signed)
[facility]EM[facility]ENCY DEPT Provider Note   CSN: [id number] Arrival date & time: 08/02/16  4098     History   Chief Complaint Chief Complaint  Patient presents with  . Diplopia  . Headache    HPI Shawn Cervantes is a 46 y.o. male.  The history is provided by the patient and medical records.  Headache   Associated symptoms include nausea.    46 year old male with history of ADHD, anxiety, bipolar disorder, depression, presenting to the ED with headache, blurred vision, and some dizziness. Patient reports this began on Sunday, 2 days ago.  States initially he started having a mild headache behind his eyes.  States it was very mild, nothing severe.  States since then he has been experiencing some blurred/double vision, more so with looking up or down, moving her head, or walking in general. He has had some nausea episodes with this and some room spinning sensation, or sensation that things around him keep moving even when he stops moving/walking, etc.  States yesterday he was having some issues getting far off but was able to see close up so was able to work as normal.  States today at work she had some increased blurred vision so he decided to come here. States he has no significant headache at present. He has no pain in his eye itself. He wears glasses for reading, no contact lenses.  States no prior issues with same.  Denies any chest pain or SOB.  States he does feel somewhat nauseated now, this is better lying still. Does report history of migraines, however not currently on any maintenance medication for this. States this does not feel like prior migraines. No other neurologic issues. No recent head trauma. Not currently on anticoagulation.  Wife does report he is in battling with a lot of sinus congestion. Patient states this was worse last week but has improved at this time.  Past Medical History:  Diagnosis Date  . ADHD (attention deficit hyperactivity disorder)   . Anxiety   . Arthritis      knee and back  . Bipolar 1 disorder (HCC)   . Complication of anesthesia   . Constipation   . Depression    bi polar  . Head injury    as a child ;had stitches in head  . Mental disorder    Bipolar  . PONV (postoperative nausea and vomiting)   . Testosterone deficiency     Patient Active Problem List   Diagnosis Date Noted  . Severe episode of recurrent major depressive disorder, without psychotic features (HCC)   . Bipolar affective disorder, current episode mixed (HCC) 07/21/2015  . Wound dehiscence 07/30/2014  . S/P lumbar spinal fusion 07/17/2014  . HNP (herniated nucleus pulposus), lumbar 04/01/2013    Class: Diagnosis of  . 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 HAND SURGERY Left    tendon repai x2  . LUMBAR LAMINECTOMY Right 04/01/2013   Procedure: MICRODISCECTOMY LUMBAR LAMINECTOMY;  Surgeon: Eldred Manges, MD;  Location: [facility] OR;  Service: Orthopedics;  Laterality: Right;  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  . LUMBAR WOUND DEBRIDEMENT N/A 07/30/2014   Procedure: LUMBAR WOUND IRRIGATION AND DEBRIDEMENT,        ;  Surgeon: Tia Alert, MD;  Location: [facility];  Service: Neurosurgery;  Laterality: N/A;  . MICRODISCECTOMY LUMBAR     L4-5  . MICRODISCECTOMY LUMBAR     L4-5  .  SPINAL CORD STIMULATOR INSERTION N/A 09/25/2015   Procedure: LUMBAR SPINAL CORD STIMULATOR INSERTION;  Surgeon: Odette Fraction, MD;  Location: [facility];  Service: Neurosurgery;  Laterality: N/A;  LUMBAR SPINAL CORD STIMULATOR INSERTION  . TRANSFORAMINAL LUMBAR INTERBODY FUSION (TLIF) WITH PEDICLE SCREW FIXATION 1 LEVEL N/A 07/17/2014   Procedure: Transforaminal Lumbar Interbody Fusion Lumbar four-five with pedicle screws  ;  Surgeon: Tia Alert, MD;  Location: [facility];  Service: Neurosurgery;  Laterality: N/A;  . VASECTOMY    . WRIST ARTHROSCOPY    . WRIST FUSION Right 1998  . WRIST SURGERY Right     reconstution       Home Medications    Prior to Admission medications   Medication Sig Start Date End Date Taking? Authorizing Provider  acetaminophen (TYLENOL) 325 MG tablet Take 650 mg by mouth 2 (two) times daily as needed for moderate pain.   Yes [provider]  Amphetamine Sulfate (EVEKEO) 10 MG TABS Take 10 mg by mouth daily.   Yes [provider]  cholecalciferol (VITAMIN D) 1000 units tablet Take 1,000 Units by mouth daily.   Yes [provider]  clonazePAM (KLONOPIN) 0.5 MG tablet Take 0.5 mg by mouth 2 (two) times daily as needed for anxiety.  09/04/15  Yes [provider]  cyclobenzaprine (FLEXERIL) 10 MG tablet Take 1 tablet (10 mg total) by mouth 3 (three) times daily as needed for muscle spasms. 07/24/15  Yes Armandina Stammer I, NP  divalproex (DEPAKOTE ER) 500 MG 24 hr tablet Take 1,000 mg by mouth at bedtime.   Yes [provider]  gabapentin (NEURONTIN) 600 MG tablet Take 600 mg by mouth 2 (two) times daily. 09/02/15  Yes [provider]  ibuprofen (ADVIL,MOTRIN) 200 MG tablet Take 400 mg by mouth daily as needed for moderate pain.   Yes [provider]  lamoTRIgine (LAMICTAL) 100 MG tablet Take 1 tablet (100 mg total) by mouth 2 (two) times daily. For mood stabilization 07/24/15  Yes Nwoko, Nicole Kindred I, NP  Omega-3 Fatty Acids (FISH OIL) 1000 MG CAPS Take 1,000 mg by mouth 2 (two) times daily.   Yes [provider]  oxcarbazepine (TRILEPTAL) 600 MG tablet Take 600 mg by mouth 2 (two) times daily.   Yes [provider]  Oxycodone HCl 10 MG TABS Take 1 tablet (10 mg total) by mouth every 4 (four) hours as needed. for pain 09/25/15  Yes Odette Fraction, MD  Testosterone 10 MG/ACT (2%) GEL Apply 60 mg topically daily. 6 pumps = 10 mg per pump for hormonal replacement 07/23/15  Yes [provider]  traMADol (ULTRAM-ER) 200 MG 24 hr tablet Take 200 mg by mouth daily as needed for pain.   Yes [provider]  vitamin E 1000 UNIT capsule Take 1,000 Units by mouth at bedtime.   Yes [provider]  zolpidem (AMBIEN) 10 MG tablet Take 1 tablet (10 mg total) by mouth at bedtime as needed for sleep. 07/24/15 08/02/16 Yes Armandina Stammer I, NP  amphetamine-dextroamphetamine (ADDERALL) 15 MG tablet Take 15 mg by mouth daily as needed (for ADHD).  09/02/15   [provider]  asenapine (SAPHRIS) 5 MG SUBL 24 hr tablet Place 5 mg under the tongue once.    [provider]  cephALEXin (KEFLEX) 500 MG capsule Take 1 capsule (500 mg total) by mouth 3 (three) times daily. Patient not taking: Reported on 08/02/2016 09/25/15   Odette Fraction, MD  docusate sodium (COLACE) 100  MG capsule Take 1 capsule (100 mg total) by mouth 2 (two) times daily. (May purchase from over the counter at yr pharmacy): For constipation Patient not taking: Reported on 08/02/2016 07/24/15   Armandina Stammer I, NP  traMADol (ULTRAM) 50 MG tablet Take 50 mg by mouth 3 (three) times daily as needed for moderate pain.  08/14/15   [provider]    Family History History reviewed. No pertinent family history.  Social History Social History  Substance Use Topics  . Smoking status: Former Smoker    Packs/day: 0.00    Types: Cigars    Quit date: 10/14/2013  . Smokeless tobacco: Never Used  . Alcohol use No     Allergies   No known allergies   Review of Systems Review of Systems  Gastrointestinal: Positive for nausea.  Neurological: Positive for dizziness and headaches.  All other systems reviewed and are negative.    Physical Exam Updated Vital Signs BP (!) 149/102 (BP Location: Right Arm)   Pulse 96   Temp 97.8 F (36.6 C) (Oral)   Resp 16   SpO2 96%   Physical Exam  Constitutional: He is oriented to person, place, and time. He appears well-developed and well-nourished. No distress.  HENT:  Head: Normocephalic and atraumatic.  Right Ear: Tympanic membrane, external ear and ear canal  normal.  Left Ear: Tympanic membrane, external ear and ear canal normal.  Nose: Nose normal.  Mouth/Throat: Oropharynx is clear and moist.  Eyes: Conjunctivae and EOM are normal. Pupils are equal, round, and reactive to light.  EOMs fully intact, no nystagmus, no apparent field cuts, normal confrontation  Neck: Normal range of motion and full passive range of motion without pain. Neck supple. No neck rigidity.  No rigidity, no meningismus  Cardiovascular: Normal rate, regular rhythm and normal heart sounds.   No murmur heard. Pulmonary/Chest: Effort normal and breath sounds normal. No respiratory distress. He has no wheezes. He has no rhonchi.  Abdominal: Soft. Bowel sounds are normal. There is no tenderness. There is no guarding.  Musculoskeletal: Normal range of motion. He exhibits no edema.  Neurological: He is alert and oriented to person, place, and time. He has normal strength. He displays no tremor. No cranial nerve deficit or sensory deficit. He displays no seizure activity.  AAOx3, answering questions and following commands appropriately; equal strength UE and LE bilaterally; CN grossly intact; moves all extremities appropriately without ataxia; no focal neuro deficits or facial asymmetry appreciated  Skin: Skin is warm and dry. No rash noted. He is not diaphoretic.  Psychiatric: He has a normal mood and affect. His behavior is normal. Thought content normal.  Nursing note and vitals reviewed.    ED Treatments / Results  Labs (all labs ordered are listed, but only abnormal results are displayed) Labs Reviewed  CBC WITH DIFFERENTIAL/PLATELET - Abnormal; Notable for the following:       Result Value   MCHC 36.2 (*)    All other components within normal limits  BASIC METABOLIC PANEL - Abnormal; Notable for the following:    Sodium 122 (*)    Chloride 90 (*)    Glucose, Bld 104 (*)    Calcium 8.7 (*)    All other components within normal limits  BASIC METABOLIC PANEL -  Abnormal; Notable for the following:    Sodium 124 (*)    Chloride 92 (*)    Glucose, Bld 115 (*)    Calcium 8.3 (*)    All other  components within normal limits  URINALYSIS, ROUTINE W REFLEX MICROSCOPIC - Abnormal; Notable for the following:    Ketones, ur 5 (*)    All other components within normal limits  OSMOLALITY - Abnormal; Notable for the following:    Osmolality 258 (*)    All other components within normal limits  SODIUM, URINE, RANDOM  OSMOLALITY, URINE    EKG  EKG Interpretation None       Radiology Ct Head Wo Contrast  Result Date: 08/02/2016 CLINICAL DATA:  Headache and dizziness, blurred vision EXAM: CT HEAD WITHOUT CONTRAST TECHNIQUE: Contiguous axial images were obtained from the base of the skull through the vertex without intravenous contrast. COMPARISON:  MRI head 03/24/2011 FINDINGS: Brain: No evidence of acute infarction, hemorrhage, hydrocephalus, extra-axial collection or mass lesion/mass effect. Vascular: No hyperdense vessel or unexpected calcification. Skull: Negative Sinuses/Orbits: Prior sinus surgery with partial ethmoidectomy. Mucosal edema in the paranasal sinuses. No air-fluid level. Normal orbit. Other: None IMPRESSION: Negative CT of the brain Sinus surgery with mucosal edema in the paranasal sinuses. Electronically Signed   By: Marlan Palauharles  Clark M.D.   On: 08/02/2016 13:09    Procedures Procedures (including critical care time)  Medications Ordered in ED Medications  ondansetron (ZOFRAN) injection 4 mg (4 mg Intravenous Given 08/02/16 1135)  sodium chloride 0.9 % bolus 1,000 mL (0 mLs Intravenous Stopped 08/02/16 1225)  meclizine (ANTIVERT) tablet 50 mg (50 mg Oral Given 08/02/16 1152)     Initial Impression / Assessment and Plan / ED Course  I have reviewed the triage vital signs and the nursing notes.  Pertinent labs & imaging results that were available during my care of the patient were reviewed by me and considered in my medical decision  making (see chart for details).  46 year old male here with blurred vision, mild headache, dizziness. Upon further discussion, seems visual changes are more pronounced with movement of his eyes and walking, change in position. He has accompanied room spinning dizziness and some nausea. Has no apparent focal neurologic deficits on my exam. No field cuts, no nystagmus, normal confrontation. No signs or symptoms concerning for meningitis.  Suspect there may be a component of vertigo.  Will plan for screening labs, head CT.  IVF, meclizine, zofran.  CT head negative for acute findings. Dizziness and blurred vision have resolved after IV fluids and meclizine. States he is feeling better. He is a been able to eat crackers and drink fluids in the room without issue. Moving around in room without recurrent symptoms.  Patient screening lab work does reveal a new hyponatremia at 122. No prior issues with same based on old lab values.  Denies any vomiting, diarrhea. He has had a diet change and is now awaiting a low-carb diet with his wife, but has been eating quite a bit of meat and nuts. He has not been drinking water excessively. He is still been urinating normally. His creatinine is normal, no evidence of DKA.  Medications reviewed-- not on any meds to cause this profound drop.  I discussed the case with hospitalist service, they have evaluated in the ED-- see their consultation notes.  Repeat sodium increased to 124. Chloride also increasing. Per hospitalist recommendations, will have patient adhere to sodium rich diet, limit free water, recheck BMP with PCP in a few days. Regards to the vertiginous type symptoms, question of a labyrinthitis contributing. Recommend to start Augmentin and steroids, continue meclizine.  Will have him follow-up with PCP within 1 week for re-check.  Discussed  plan with patient, he acknowledged understanding and agreed with plan of care.  Return precautions given for new or worsening  symptoms.  Final Clinical Impressions(s) / ED Diagnoses   Final diagnoses:  Blurred vision  Dizziness  Hyponatremia    New Prescriptions New Prescriptions   AMOXICILLIN-CLAVULANATE (AUGMENTIN) 875-125 MG TABLET    Take 1 tablet by mouth every 12 (twelve) hours.   MECLIZINE (ANTIVERT) 25 MG TABLET    Take 2 tablets (50 mg total) by mouth 3 (three) times daily as needed for dizziness.   ONDANSETRON (ZOFRAN ODT) 4 MG DISINTEGRATING TABLET    Take 1 tablet (4 mg total) by mouth every 8 (eight) hours as needed for nausea.   PREDNISONE (DELTASONE) 20 MG TABLET    Take 40 mg by mouth daily for 3 days, then 20mg  by mouth daily for 3 days, then 10mg  daily for 3 days     Garlon Hatchet, PA-C 08/02/16 1519    Little, Ambrose Finland, MD 08/10/16 1254

## 2016-08-02 NOTE — Consult Note (Signed)
[facility] Medical Consultation  Eliam HYLAND MOLLENKOPF DGL:[id number] DOB: 1971-01-19 DOA: 08/02/2016 PCP: Tally Joe, MD   Requesting physician: Sharilyn Sites PA Date of consultation: 08/02/16 Reason for consultation: admission  Impression/Recommendations Principal Problem:   Hyponatremia Active Problems:   Bipolar affective disorder, current episode mixed (HCC)   Hypertension   Vertigo  #1. Hyponatremia. Etiology unclear. Sodium of 122. Appears euovolemic.  Patient denies any episodes of vomiting or diarrhea. He denies consuming unusual amounts of free water. Discussed home meds with pharmacy who report Trileptal causes hyponatremia 7-9% of patients. Chart review indicates no history of same. He is provided with 1 L normal saline while in the emergency department. Symptoms much improved at time of interview -Recheck basic metabolic panel -Ambulate patient in hall - Recommendation: Pending results of above may discharge to home with instructions to eat salty rich diet and limit free water for 3 days. Follow-up with primary care provider 3 days recommend basic metabolic panel to track sodium level  #2. Vertigo. Symptoms resolved at time of interview. CT of the head without acute abnormality. Exam unremarkable. He received 1 L of normal saline, Zofran, Antivert in the emergency department. -Ambulate in the hall -Discharge with a prescription for Antivert -Discharge to home with instructions to follow-up with primary care provider as noted above  #3. Hypertension. No history of same. Blood pressure 145/101 at the time of interview. Symptoms may be related to hypertension. -Hydralazine 10 mg IV in the emergency department -Monitor -Discharge with outpatient follow-up instructions  #4. Bipolar affective disorder. Appears stable at baseline. -Continue home meds  Recommendation: Pending results of above plan recommend discharge to home with prescription for meclizine, Zofran as well as  instructions for salt rich diet for 2 days and free water restriction. Recommend outpatient follow-up 3 days to include basic metabolic panel to track sodium level Thank you for this consultation.  Chief Complaint: headache, blurred vision  HPI:  This is 46 year old with a history of ADHD, anxiety, bipolar disorder, depression, presents to emergency Department chief complaint of headache blurred vision with dizziness. Initial evaluation reveals a CT of the head there is unremarkable and presentation were consistent with vertigo and hypertension.  Information is obtained from the patient. He reports 2 days ago he developed a mild headache that he describes as an ache behind his eyes. Shortly thereafter he developed "blurred/double" vision worse with position change or turning his head up and down or side-to-side. Associated symptoms include nausea without vomiting and the sensation that the room was spinning. Today symptoms seem to worsen. He denies chest pain palpitation shortness of breath fever chills abdominal pain diarrhea constipation or bright red blood per rectum. He denies cough lower extremity edema or orthopnea. He denies slurred speech numbness tingling of extremities or difficulty swallowing. He reports lying still improved symptoms moving about makes symptoms worse. He denies any recent diagnosis of hypertension or any antihypertensive medications. He denies any changes in his home medications at all.  Review of Systems:  10 point review of systems complete and all systems are negative except as indicated in the history of present illness  Past Medical History:  Diagnosis Date  . ADHD (attention deficit hyperactivity disorder)   . Anxiety   . Arthritis    knee and back  . Bipolar 1 disorder (HCC)   . Complication of anesthesia   . Constipation   . Depression    bi polar  . Head injury    as a child ;had stitches  in head  . Mental disorder    Bipolar  . PONV (postoperative  nausea and vomiting)   . Testosterone deficiency    Past Surgical History:  Procedure Laterality Date  . HAND SURGERY Left    tendon repai x2  . 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 LUMBAR WOUND DEBRIDEMENT N/A 07/30/2014   Procedure: LUMBAR WOUND IRRIGATION AND DEBRIDEMENT,        ;  Surgeon: Tia Alert, MD;  Location: [facility];  Service: Neurosurgery;  Laterality: N/A;  . MICRODISCECTOMY LUMBAR     L4-5  . MICRODISCECTOMY LUMBAR     L4-5  . SPINAL CORD STIMULATOR INSERTION N/A 09/25/2015   Procedure: LUMBAR SPINAL CORD STIMULATOR INSERTION;  Surgeon: Odette Fraction, MD;  Location: [facility];  Service: Neurosurgery;  Laterality: N/A;  LUMBAR SPINAL CORD STIMULATOR INSERTION  . TRANSFORAMINAL LUMBAR INTERBODY FUSION (TLIF) WITH PEDICLE SCREW FIXATION 1 LEVEL N/A 07/17/2014   Procedure: Transforaminal Lumbar Interbody Fusion Lumbar four-five with pedicle screws  ;  Surgeon: Tia Alert, MD;  Location: [facility];  Service: Neurosurgery;  Laterality: N/A;  . VASECTOMY    . WRIST ARTHROSCOPY    . WRIST FUSION Right 1998  . WRIST SURGERY Right    reconstution   Social History:  reports that he quit smoking about 2 years ago. His smoking use included Cigars. He smoked 0.00 packs per day. He has never used smokeless tobacco. He reports that he does not drink alcohol or use drugs.  Allergies  Allergen Reactions  . No Known Allergies    Family History  Problem Relation Age of Onset  . Hypertension Mother   . Anxiety disorder Father     Prior to Admission medications   Medication Sig Start Date End Date Taking? Authorizing Provider  acetaminophen (TYLENOL) 325 MG tablet Take 650 mg by mouth 2 (two) times daily as needed for moderate pain.   Yes [provider]  Amphetamine Sulfate  (EVEKEO) 10 MG TABS Take 10 mg by mouth daily.   Yes [provider]  cholecalciferol (VITAMIN D) 1000 units tablet Take 1,000 Units by mouth daily.   Yes [provider]  clonazePAM (KLONOPIN) 0.5 MG tablet Take 0.5 mg by mouth 2 (two) times daily as needed for anxiety.  09/04/15  Yes [provider]  cyclobenzaprine (FLEXERIL) 10 MG tablet Take 1 tablet (10 mg total) by mouth 3 (three) times daily as needed for muscle spasms. 07/24/15  Yes Armandina Stammer I, NP  divalproex (DEPAKOTE ER) 500 MG 24 hr tablet Take 1,000 mg by mouth at bedtime.   Yes [provider]  gabapentin (NEURONTIN) 600 MG tablet Take 600 mg by mouth 2 (two) times daily. 09/02/15  Yes [provider]  ibuprofen (ADVIL,MOTRIN) 200 MG tablet Take 400 mg by mouth daily as needed for moderate pain.   Yes [provider]  lamoTRIgine (LAMICTAL) 100 MG tablet Take 1 tablet (100 mg total) by mouth 2 (two) times daily. For mood stabilization 07/24/15  Yes Nwoko, Nicole Kindred I, NP  Omega-3 Fatty Acids (FISH OIL) 1000 MG CAPS Take 1,000 mg by mouth 2 (two) times daily.   Yes [provider]  oxcarbazepine (TRILEPTAL) 600 MG tablet Take 600 mg by mouth 2 (two) times daily.   Yes [provider]  Oxycodone HCl 10 MG TABS Take 1 tablet (10 mg total) by mouth every 4 (four) hours as needed. for pain 09/25/15  Yes Odette FractionHarkins, Paul, MD  Testosterone 10 MG/ACT (2%) GEL Apply 60 mg topically daily. 6 pumps = 10 mg per pump for hormonal replacement 07/23/15  Yes [provider]  traMADol (ULTRAM-ER) 200 MG 24 hr tablet Take 200 mg by mouth daily as needed for pain.   Yes [provider]  vitamin E 1000 UNIT capsule Take 1,000 Units by mouth at bedtime.   Yes [provider]  zolpidem (AMBIEN) 10 MG tablet Take 1 tablet (10 mg total) by mouth at bedtime as needed for sleep. 07/24/15 08/02/16 Yes Armandina StammerNwoko, Agnes I, NP  amphetamine-dextroamphetamine (ADDERALL) 15 MG tablet  Take 15 mg by mouth daily as needed (for ADHD).  09/02/15   [provider]  asenapine (SAPHRIS) 5 MG SUBL 24 hr tablet Place 5 mg under the tongue once.    [provider]  cephALEXin (KEFLEX) 500 MG capsule Take 1 capsule (500 mg total) by mouth 3 (three) times daily. Patient not taking: Reported on 08/02/2016 09/25/15   Odette FractionHarkins, Paul, MD  docusate sodium (COLACE) 100 MG capsule Take 1 capsule (100 mg total) by mouth 2 (two) times daily. (May purchase from over the counter at yr pharmacy): For constipation Patient not taking: Reported on 08/02/2016 07/24/15   Armandina StammerNwoko, Agnes I, NP  traMADol (ULTRAM) 50 MG tablet Take 50 mg by mouth 3 (three) times daily as needed for moderate pain.  08/14/15   [provider]   Physical Exam: Blood pressure (!) 145/101, pulse 79, temperature 98.2 F (36.8 C), resp. rate 18, SpO2 99 %. Vitals:   08/02/16 1215 08/02/16 1230  BP: (!) 142/97 (!) 145/101  Pulse: 78 79  Resp: (!) 9 18  Temp:       General:  Awake alert obese sitting up in bed in no acute distress  Eyes: Pupils equal round reactive to light EOMI, no nystagmus  ENT: Ears clear nose without drainage oropharynx without erythema or exudate  Neck: Supple no JVD no lymphadenopathy full range of motion  Cardiovascular: Regular rate and rhythm no murmur gallop or rub no lower extremity edema pedal pulses present and palpable  Respiratory: Normal effort breath sounds are clear bilaterally no rhonchi no crackles  Abdomen: Obese soft positive bowel sounds throughout no guarding or rebounding nontender to palpation  Skin: Warm and dry no rashes or lesions  Musculoskeletal: Joints without swelling/erythema full range of motion  Psychiatric: Calm comfortable cooperative appropriate  Neurologic: Speech clear facial symmetry bilateral grip 5 out of 5 lower extremity strength 5 out of 5 bilaterally  Labs on Admission:  Basic Metabolic Panel:  Recent Labs Lab 08/02/16 1135   NA 122*  K 3.8  CL 90*  CO2 24  GLUCOSE 104*  BUN 7  CREATININE 0.70  CALCIUM 8.7*   Liver Function Tests: No results for input(s): AST, ALT, ALKPHOS, BILITOT, PROT, ALBUMIN in the last 168 hours. No results for input(s): LIPASE, AMYLASE in the last 168 hours. No results for input(s): AMMONIA in the last 168 hours. CBC:  Recent Labs Lab 08/02/16 1135  WBC 5.5  NEUTROABS 3.7  HGB 15.4  HCT 42.5  MCV 86.0  PLT 262   Cardiac Enzymes: No results for input(s): CKTOTAL, CKMB, CKMBINDEX, TROPONINI in the last 168 hours. BNP: Invalid input(s): POCBNP CBG: No results for input(s): GLUCAP in the last 168 hours.  Radiological Exams  on Admission: Ct Head Wo Contrast  Result Date: 08/02/2016 CLINICAL DATA:  Headache and dizziness, blurred vision EXAM: CT HEAD WITHOUT CONTRAST TECHNIQUE: Contiguous axial images were obtained from the base of the skull through the vertex without intravenous contrast. COMPARISON:  MRI head 03/24/2011 FINDINGS: Brain: No evidence of acute infarction, hemorrhage, hydrocephalus, extra-axial collection or mass lesion/mass effect. Vascular: No hyperdense vessel or unexpected calcification. Skull: Negative Sinuses/Orbits: Prior sinus surgery with partial ethmoidectomy. Mucosal edema in the paranasal sinuses. No air-fluid level. Normal orbit. Other: None IMPRESSION: Negative CT of the brain Sinus surgery with mucosal edema in the paranasal sinuses. Electronically Signed   By: Marlan Palau M.D.   On: 08/02/2016 13:09    EKG:   Time spent: 45 minutes   [facility]   If 7PM-7AM, please contact night-coverage [email or web address] Password [facility] 08/02/2016, 2:23 PM

## 2016-08-02 NOTE — ED Triage Notes (Signed)
Pt sts HA around eyes and double vision x 3 days with nausea

## 2016-08-02 NOTE — ED Notes (Signed)
Patient transported to CT 

## 2016-08-02 NOTE — Discharge Instructions (Signed)
As we discussed, your sodium was low today. He improved somewhat here with IV fluids were given. He will need this rechecked within the next week with your primary care doctor. In the meantime we recommend you eat a solid diet, limit your free water but may drink Gatorade or other sodium rich fluids. Take the prescribed medication as directed.  Recommend to take dose steroids earlier in the morning as they can keep your awake at night. Follow-up with your primary care doctor closely. Return to the ED for new or worsening symptoms-- tremor/seizure, worsening dizziness, nausea, vomiting, diarrhea, etc.

## 2016-08-03 DIAGNOSIS — J0141 Acute recurrent pansinusitis: Secondary | ICD-10-CM | POA: Insufficient documentation

## 2016-08-03 DIAGNOSIS — R42 Dizziness and giddiness: Secondary | ICD-10-CM | POA: Insufficient documentation

## 2016-08-03 DIAGNOSIS — F3170 Bipolar disorder, currently in remission, most recent episode unspecified: Secondary | ICD-10-CM | POA: Insufficient documentation

## 2016-11-28 ENCOUNTER — Emergency Department (HOSPITAL_COMMUNITY)
Admission: EM | Admit: 2016-11-28 | Discharge: 2016-11-28 | Disposition: A | Discharge status: Home / Self Care | Payer: BC Managed Care – PPO | Attending: Emergency Medicine | Admitting: Emergency Medicine

## 2016-11-28 ENCOUNTER — Encounter (HOSPITAL_COMMUNITY): Payer: Self-pay | Admitting: *Deleted

## 2016-11-28 ENCOUNTER — Emergency Department (HOSPITAL_COMMUNITY): Payer: BC Managed Care – PPO

## 2016-11-28 DIAGNOSIS — R51 Headache: Secondary | ICD-10-CM | POA: Diagnosis not present

## 2016-11-28 DIAGNOSIS — Y939 Activity, unspecified: Secondary | ICD-10-CM | POA: Insufficient documentation

## 2016-11-28 DIAGNOSIS — H53149 Visual discomfort, unspecified: Secondary | ICD-10-CM | POA: Diagnosis not present

## 2016-11-28 DIAGNOSIS — M545 Low back pain: Secondary | ICD-10-CM | POA: Insufficient documentation

## 2016-11-28 DIAGNOSIS — Z87891 Personal history of nicotine dependence: Secondary | ICD-10-CM | POA: Diagnosis not present

## 2016-11-28 DIAGNOSIS — Y999 Unspecified external cause status: Secondary | ICD-10-CM | POA: Diagnosis not present

## 2016-11-28 DIAGNOSIS — S0990XA Unspecified injury of head, initial encounter: Secondary | ICD-10-CM | POA: Diagnosis present

## 2016-11-28 DIAGNOSIS — M542 Cervicalgia: Secondary | ICD-10-CM | POA: Insufficient documentation

## 2016-11-28 DIAGNOSIS — I10 Essential (primary) hypertension: Secondary | ICD-10-CM | POA: Insufficient documentation

## 2016-11-28 DIAGNOSIS — Y92410 Unspecified street and highway as the place of occurrence of the external cause: Secondary | ICD-10-CM | POA: Insufficient documentation
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
## 2016-11-28 NOTE — Discharge Instructions (Signed)
Please read attached information regarding your condition, back exercises and precautions. Take home medications as previously prescribed. Follow up with PCP for further evaluation. Return to ED for worsening headache, additional injury, falls, trouble walking, loss of bladder function.
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
## 2017-01-12 ENCOUNTER — Other Ambulatory Visit: Payer: Self-pay | Admitting: Student

## 2017-01-12 DIAGNOSIS — M5416 Radiculopathy, lumbar region: Secondary | ICD-10-CM

## 2017-01-20 ENCOUNTER — Ambulatory Visit
Admission: RE | Admit: 2017-01-20 | Discharge: 2017-01-20 | Disposition: A | Discharge status: Home / Self Care | Payer: BC Managed Care – PPO | Source: Ambulatory Visit | Attending: Student | Admitting: Student

## 2017-01-20 VITALS — BP 116/74 | HR 70

## 2017-01-20 DIAGNOSIS — M545 Low back pain, unspecified: Secondary | ICD-10-CM | POA: Insufficient documentation

## 2017-01-20 DIAGNOSIS — M5416 Radiculopathy, lumbar region: Secondary | ICD-10-CM

## 2017-01-20 DIAGNOSIS — Z981 Arthrodesis status: Secondary | ICD-10-CM

## 2017-01-20 DIAGNOSIS — F419 Anxiety disorder, unspecified: Secondary | ICD-10-CM | POA: Insufficient documentation

## 2017-01-20 MED ORDER — DIAZEPAM 5 MG PO TABS
10.0000 mg | ORAL_TABLET | Freq: Once | ORAL | Status: AC
  Administered 2017-01-20: 10 mg via ORAL

## 2017-01-20 MED ORDER — ONDANSETRON HCL 4 MG/2ML IJ SOLN
4.0000 mg | Freq: Once | INTRAMUSCULAR | Status: AC
  Administered 2017-01-20: 4 mg via INTRAMUSCULAR

## 2017-01-20 MED ORDER — MEPERIDINE HCL 100 MG/ML IJ SOLN
75.0000 mg | Freq: Once | INTRAMUSCULAR | Status: AC
  Administered 2017-01-20: 75 mg via INTRAMUSCULAR

## 2017-01-20 MED ORDER — IOPAMIDOL (ISOVUE-M 200) INJECTION 41%
15.0000 mL | Freq: Once | INTRAMUSCULAR | Status: AC
  Administered 2017-01-20: 15 mL via INTRATHECAL

## 2017-01-20 MED ORDER — HYDROMORPHONE HCL 2 MG/ML IJ SOLN
2.0000 mg | Freq: Once | INTRAMUSCULAR | Status: AC
  Administered 2017-01-20: 2 mg via INTRAMUSCULAR

## 2017-01-20 NOTE — Discharge Instructions (Signed)
Myelogram Discharge Instructions  1. Go home and rest quietly for the next 24 hours.  It is important to lie flat for the next 24 hours.  Get up only to go to the restroom.  You may lie in the bed or on a couch on your back, your stomach, your left side or your right side.  You may have one pillow under your head.  You may have pillows between your knees while you are on your side or under your knees while you are on your back.  2. DO NOT drive today.  Recline the seat as far back as it will go, while still wearing your seat belt, on the way home.  3. You may get up to go to the bathroom as needed.  You may sit up for 10 minutes to eat.  You may resume your normal diet and medications unless otherwise indicated.  Drink lots of extra fluids today and tomorrow.  4. The incidence of headache, nausea, or vomiting is about 5% (one in 20 patients).  If you develop a headache, lie flat and drink plenty of fluids until the headache goes away.  Caffeinated beverages may be helpful.  If you develop severe nausea and vomiting or a headache that does not go away with flat bed rest, call [phone].  5. You may resume normal activities after your 24 hours of bed rest is over; however, do not exert yourself strongly or do any heavy lifting tomorrow. If when you get up you have a headache when standing, go back to bed and force fluids for another 24 hours.  6. Call your physician for a follow-up appointment.  The results of your myelogram will be sent directly to your physician by the following day.  7. If you have any questions or if complications develop after you arrive home, please call [phone].  Discharge instructions have been explained to the patient.  The patient, or the person responsible for the patient, fully understands these instructions.        May resume Tramadol and Adderall on Nov. 10, 2018, after 9:30 am.

## 2017-01-20 NOTE — Progress Notes (Signed)
Patient states he has been off Adderall and Tramadol for at least the past two days.

## 2017-05-17 IMAGING — US US SOFT TISSUE HEAD/NECK
1 series · 7 of 7 positions shown · non-contrast
Comparison: None.

CLINICAL DATA: Posterior neck palpable abnormality to the left of
midline.

EXAM:
ULTRASOUND OF HEAD/NECK SOFT TISSUES
TECHNIQUE: Ultrasound examination of the head and neck soft tissues was
performed in the area of clinical concern.

[Series 1: us soft tissue head/neck · 0.05mm/px · 7 of 7 slices shown]
[im 1/7]
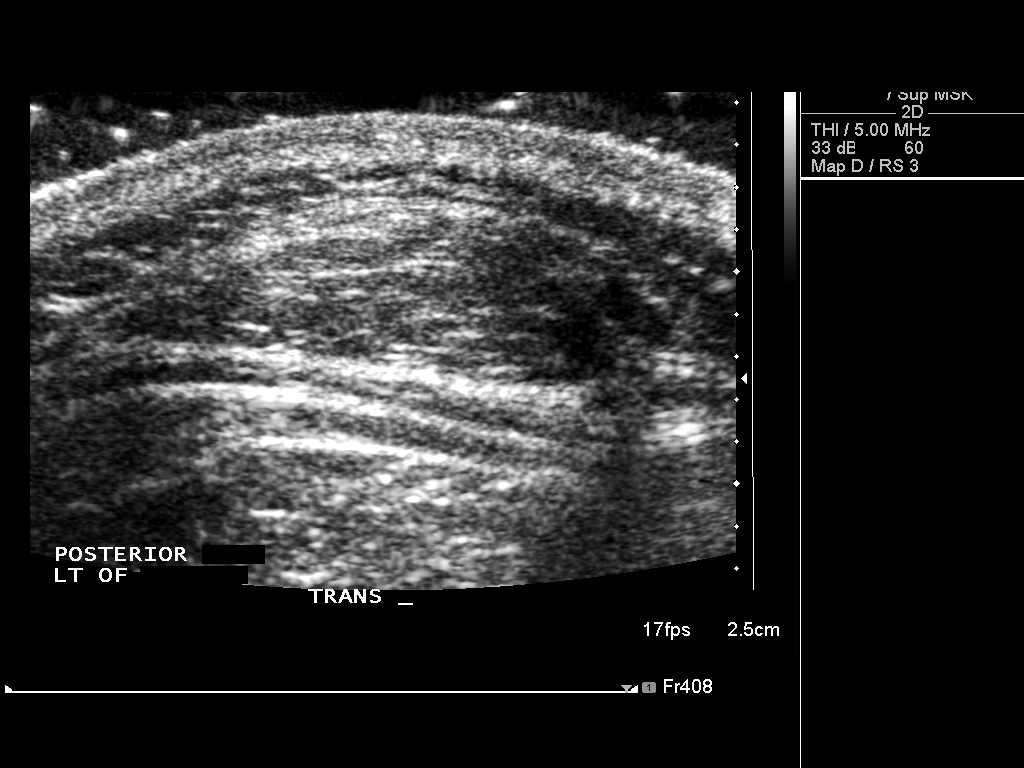
[im 2/7]
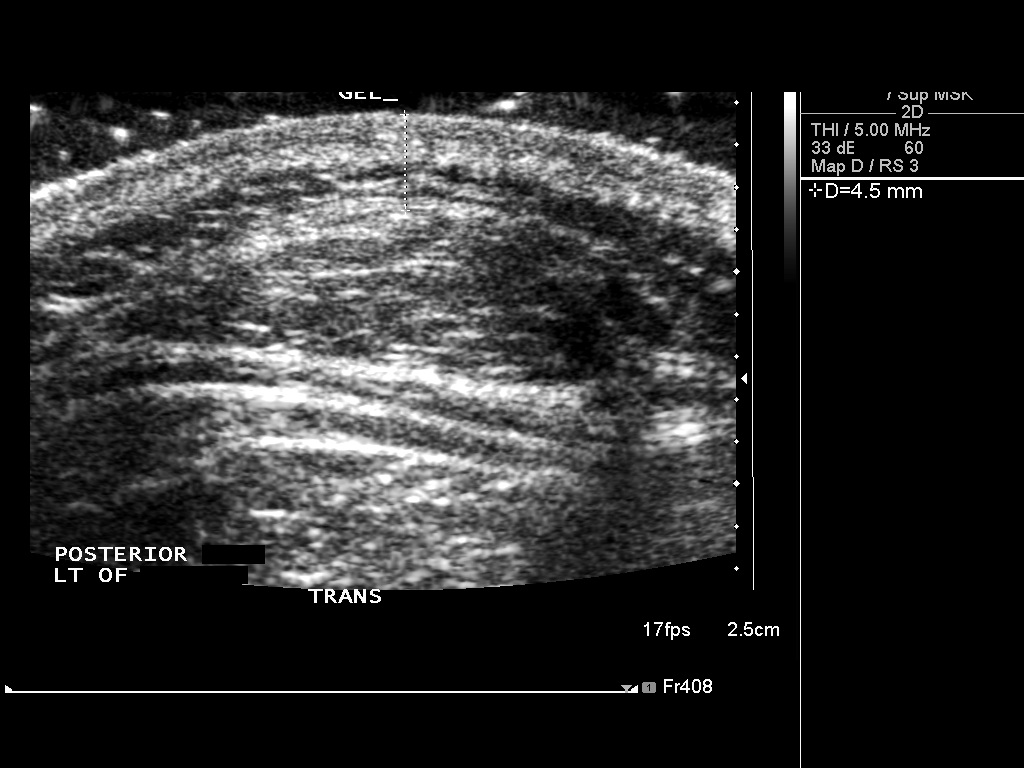
[im 3/7]
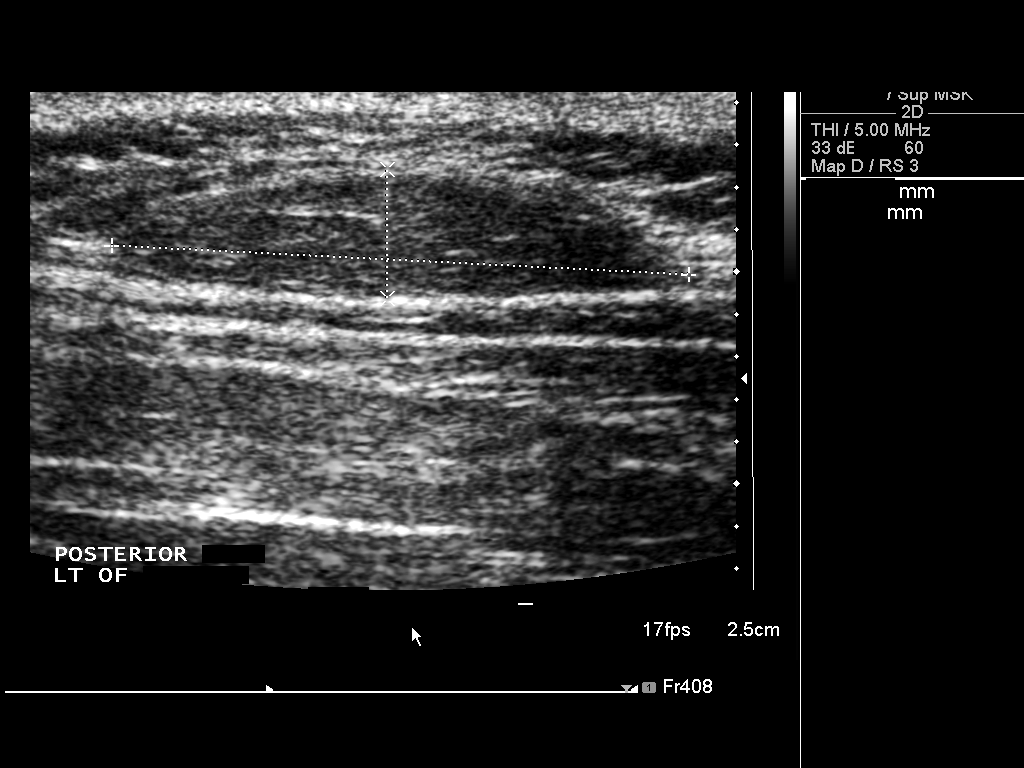
[im 4/7]
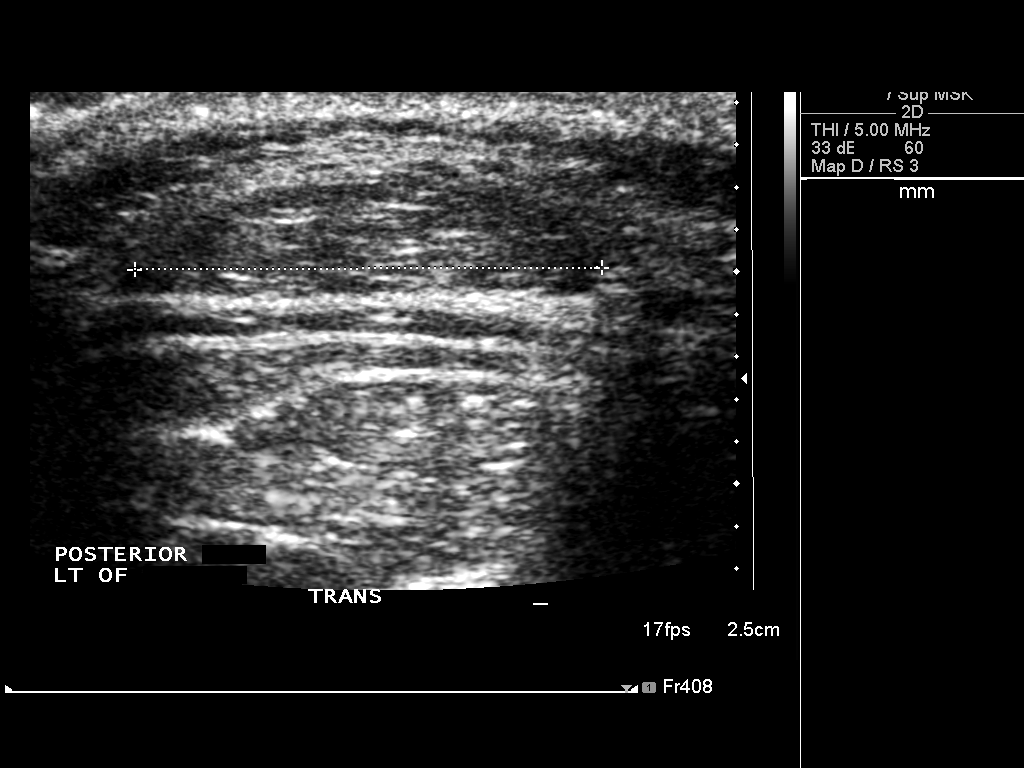
[im 5/7]
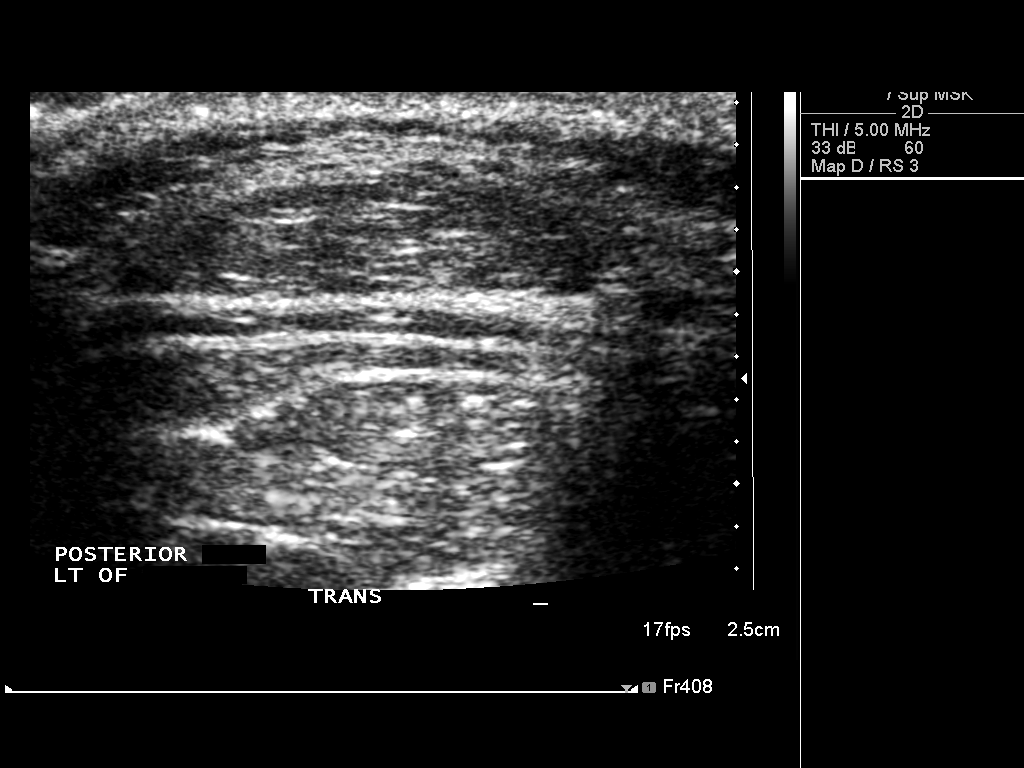
[im 6/7]
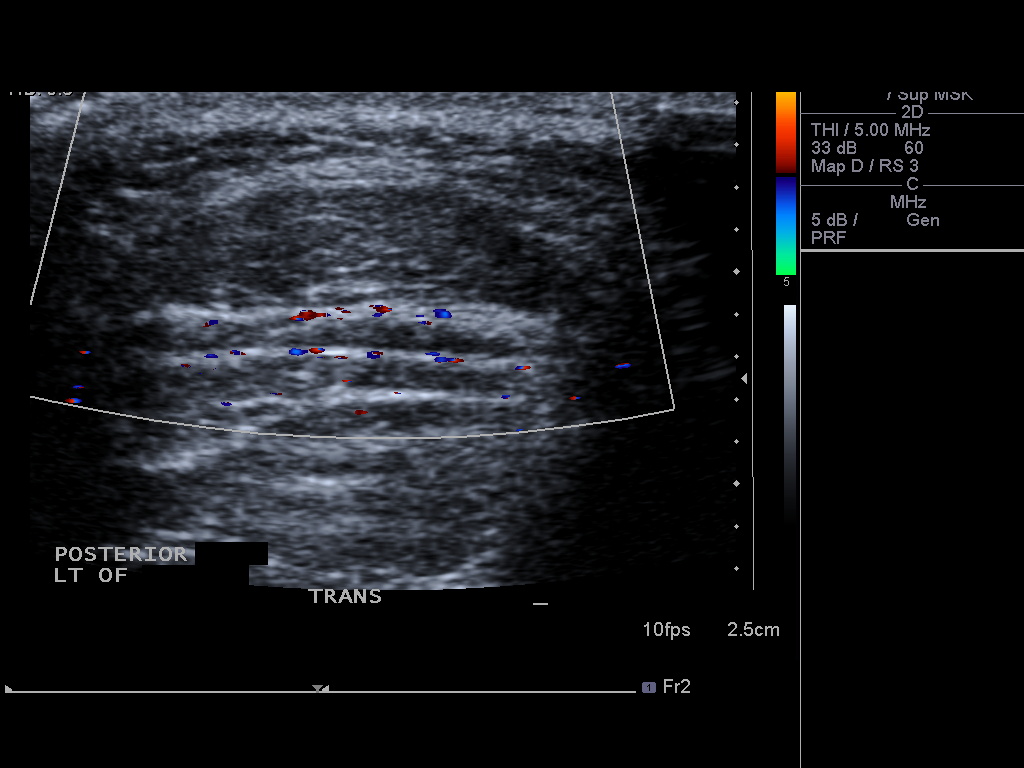
[im 7/7]
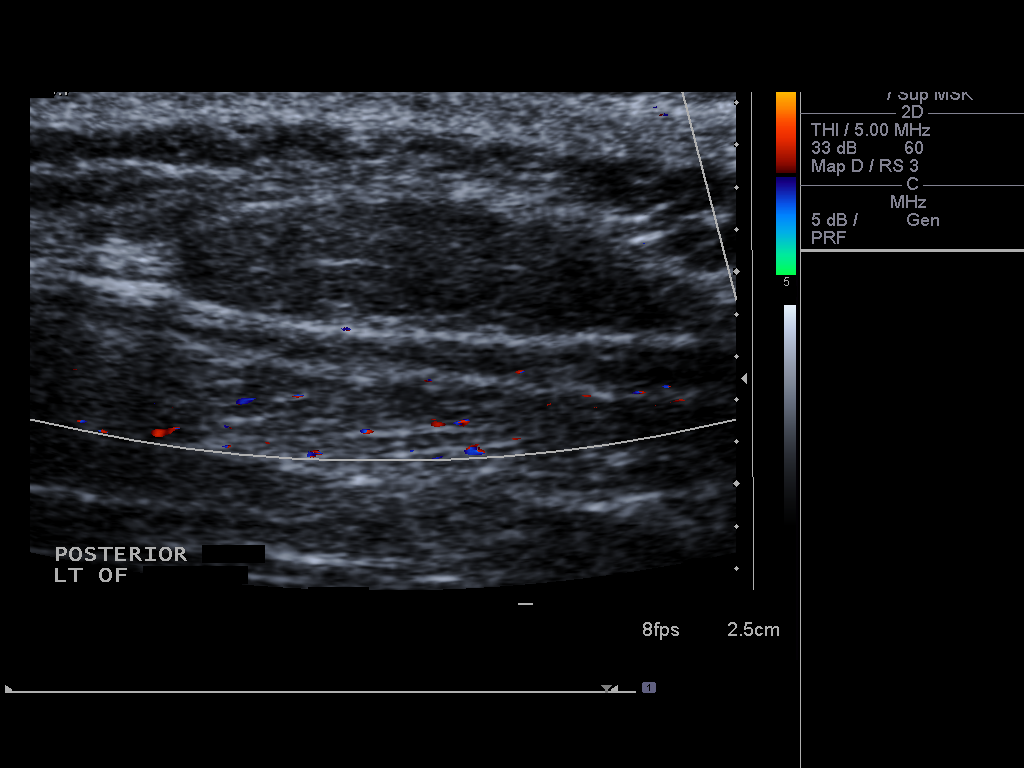

[7 of 7 positions shown; findings below may reference images not displayed]

FINDINGS: The palpable abnormality corresponds to a deep subcutaneous
heterogeneous mass measuring 2.7 x 0.6 x 2.2 cm. There is no
shadowing or fluid elements. Little if any vascularity.
IMPRESSION: The palpable abnormality corresponds to a nonspecific mass measuring
2.7 x 0.6 x 2.2 cm. CT can be performed to further delineate if
desired.

## 2017-06-12 ENCOUNTER — Telehealth: Payer: BC Managed Care – PPO | Admitting: Family

## 2017-06-12 DIAGNOSIS — J329 Chronic sinusitis, unspecified: Secondary | ICD-10-CM

## 2017-06-12 DIAGNOSIS — B9689 Other specified bacterial agents as the cause of diseases classified elsewhere: Secondary | ICD-10-CM

## 2017-06-12 MED ORDER — AMOXICILLIN-POT CLAVULANATE 875-125 MG PO TABS: 1.0000 | ORAL_TABLET | Freq: Two times a day (BID) | ORAL | 0 refills | Status: AC

## 2017-06-12 NOTE — Progress Notes (Signed)

## 2018-02-07 ENCOUNTER — Ambulatory Visit (INDEPENDENT_AMBULATORY_CARE_PROVIDER_SITE_OTHER): Payer: Self-pay

## 2018-02-07 ENCOUNTER — Encounter (INDEPENDENT_AMBULATORY_CARE_PROVIDER_SITE_OTHER): Payer: Self-pay | Admitting: Orthopaedic Surgery

## 2018-02-07 ENCOUNTER — Ambulatory Visit (INDEPENDENT_AMBULATORY_CARE_PROVIDER_SITE_OTHER): Payer: BC Managed Care – PPO | Admitting: Orthopaedic Surgery

## 2018-02-07 DIAGNOSIS — G8929 Other chronic pain: Secondary | ICD-10-CM | POA: Diagnosis not present

## 2018-02-07 DIAGNOSIS — M25561 Pain in right knee: Secondary | ICD-10-CM | POA: Diagnosis not present

## 2018-02-07 MED ORDER — LIDOCAINE HCL 1 % IJ SOLN
2.0000 mL | INTRAMUSCULAR | Status: AC | PRN
  Administered 2018-02-07: 2 mL

## 2018-02-07 MED ORDER — BUPIVACAINE HCL 0.25 % IJ SOLN
2.0000 mL | INTRAMUSCULAR | Status: AC | PRN
  Administered 2018-02-07: 2 mL via INTRA_ARTICULAR

## 2018-02-07 MED ORDER — METHYLPREDNISOLONE ACETATE 40 MG/ML IJ SUSP
40.0000 mg | INTRAMUSCULAR | Status: AC | PRN
Start: 2018-02-07 — End: 2018-02-07
  Administered 2018-02-07: 40 mg via INTRA_ARTICULAR

## 2018-02-07 NOTE — Progress Notes (Signed)
Office Visit Note   Patient: Shawn Cervantes           Date of Birth: 07/15/1970           MRN: [id number] Visit Date: 02/07/2018              Requested by: Tally JoeSwayne, David, MD [phone] [address] [place], [place] [address] PCP: Tally JoeSwayne, David, MD   Assessment & Plan: Visit Diagnoses:  1. Chronic pain of right knee     Plan: Impression is right knee osteoarthritis.  We will reinject the right knee with cortisone today.  He will give this at least 2 weeks and then call if he is not any better.  At that point we we will send for approval for Visco supplementation injections.  Otherwise, follow-up with us as needed.  Follow-Up Instructions: Return if symptoms worsen or fail to improve.   Orders:  Orders Placed This Encounter  Procedures  . Large Joint Inj: R knee  . XR KNEE 3 VIEW RIGHT   No orders of the defined types were placed in this encounter.     Procedures: Large Joint Inj: R knee on 02/07/2018 4:11 PM Indications: pain Details: 22 G needle, anterolateral approach Medications: 2 mL lidocaine 1 %; 2 mL bupivacaine 0.25 %; 40 mg methylPREDNISolone acetate 40 MG/ML      Clinical Data: No additional findings.   Subjective: Chief Complaint  Patient presents with  . Right Knee - Pain    HPI patient is a pleasant 47 year old gentleman who presents for clinic today with right knee pain.  This is been ongoing for the past several years without any known injury or change in activity.  He does note that his pain is starting to worsen.  Pain he has is to the entire knee.  He describes this as a constant ache with sharp shooting pains that occur when pivoting or squatting.  He does have significant start up pain and stiffness.  Pain does appear to improve after walking for a few steps.  He is now starting to get pain at night.  He notes a previous right knee aspiration and cortisone injection several years back with good relief of symptoms.  He does note that he takes  Advil and Tylenol without relief.  He is on chronic oxycodone which he gets from the pain clinic due to back pain from a lumbar fusion a few years back.  Review of Systems as detailed in HPI.  All others reviewed and are negative.   Objective: Vital Signs: There were no vitals taken for this visit.  Physical Exam well-developed well-nourished gentleman in no acute distress.  Alert and oriented x3.  Ortho Exam examination of his right knee shows a trace effusion.  Range of motion 0 to 125 degrees.  Medial and lateral joint line tenderness.  Moderate patellofemoral crepitus.  He is stable valgus varus stress.  He is neurovascularly intact distally.  Specialty Comments:  No specialty comments available.  Imaging: Xr Knee 3 View Right  Result Date: 02/07/2018 Moderate tricompartmental degenerative changes    PMFS History: Patient Active Problem List   Diagnosis Date Noted  . Chronic pain of right knee 02/07/2018  . Anxiety 01/20/2017  . Low back pain 01/20/2017  . Dizziness   . Acute recurrent pansinusitis   . Bipolar disorder in full remission (HCC)   . Hyponatremia 08/02/2016  . Hypertension 08/02/2016  . Vertigo 08/02/2016  . Severe episode of recurrent major depressive  disorder, without psychotic features (HCC)   . Bipolar affective disorder, current episode mixed (HCC) 07/21/2015  . Wound dehiscence 07/30/2014  . S/P lumbar spinal fusion 07/17/2014  . HNP (herniated nucleus pulposus), lumbar 04/01/2013    Class: Diagnosis of  . 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 ADHD (attention deficit hyperactivity disorder)   . Anxiety   . Arthritis    knee and back  . Bipolar 1 disorder (HCC)   . Complication of anesthesia   . Constipation   . Depression    bi polar  . Head injury    as a child ;had stitches in head  . Mental disorder    Bipolar  . PONV (postoperative nausea and vomiting)   .  Testosterone deficiency     Family History  Problem Relation Age of Onset  . Hypertension Mother   . Anxiety disorder Father     Past Surgical History:  Procedure Laterality Date  . HAND SURGERY Left    tendon repai x2  . LUMBAR LAMINECTOMY Right 04/01/2013   Procedure: MICRODISCECTOMY LUMBAR LAMINECTOMY;  Surgeon: Eldred Manges, MD;  Location[facility];  Service: Orthopedics;  Laterality: Right;  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  . LUMBAR WOUND DEBRIDEMENT N/A 07/30/2014   Procedure: LUMBAR WOUND IRRIGATION AND DEBRIDEMENT,        ;  Surgeon: Tia Alert, MD;  Location: [facility];  Service: Neurosurgery;  Laterality: N/A;  . MICRODISCECTOMY LUMBAR     L4-5  . MICRODISCECTOMY LUMBAR     L4-5  . SPINAL CORD STIMULATOR INSERTION N/A 09/25/2015   Procedure: LUMBAR SPINAL CORD STIMULATOR INSERTION;  Surgeon: Odette Fraction, MD;  Location: [facility];  Service: Neurosurgery;  Laterality: N/A;  LUMBAR SPINAL CORD STIMULATOR INSERTION  . TRANSFORAMINAL LUMBAR INTERBODY FUSION (TLIF) WITH PEDICLE SCREW FIXATION 1 LEVEL N/A 07/17/2014   Procedure: Transforaminal Lumbar Interbody Fusion Lumbar four-five with pedicle screws  ;  Surgeon: Tia Alert, MD;  Location: [facility];  Service: Neurosurgery;  Laterality: N/A;  . VASECTOMY    . WRIST ARTHROSCOPY    . WRIST FUSION Right 1998  . WRIST SURGERY Right    reconstution   Social History   Occupational History  . Not on file  Tobacco Use  . Smoking status: Former Smoker    Packs/day: 0.00    Types: Cigars    Last attempt to quit: 10/14/2013    Years since quitting: 4.3  . Smokeless tobacco: Never Used  Substance and Sexual Activity  . Alcohol use: No  . Drug use: No  . Sexual activity: Not on file    Comment: 1- 2 a week

## 2018-09-12 ENCOUNTER — Other Ambulatory Visit: Payer: Self-pay | Admitting: Student

## 2018-09-12 DIAGNOSIS — M5416 Radiculopathy, lumbar region: Secondary | ICD-10-CM

## 2018-09-13 ENCOUNTER — Telehealth: Payer: Self-pay

## 2018-09-13 NOTE — Telephone Encounter (Signed)
Called patient to screen his medications and drug allergies before he is scheduled for a myelogram.  He was informed he will need to be here 2-2.5 hours, will need a driver and will need to be on strict bedrest for 24 hours after the procedure.

## 2018-10-03 ENCOUNTER — Ambulatory Visit
Admission: RE | Admit: 2018-10-03 | Discharge: 2018-10-03 | Disposition: A | Discharge status: Home / Self Care | Payer: BC Managed Care – PPO | Source: Ambulatory Visit | Attending: Student | Admitting: Student

## 2018-10-03 ENCOUNTER — Other Ambulatory Visit: Payer: Self-pay

## 2018-10-03 DIAGNOSIS — M5416 Radiculopathy, lumbar region: Secondary | ICD-10-CM

## 2018-10-03 MED ORDER — DIAZEPAM 5 MG PO TABS
10.0000 mg | ORAL_TABLET | Freq: Once | ORAL | Status: AC
  Administered 2018-10-03: 10:00:00 10 mg via ORAL

## 2018-10-03 MED ORDER — IOPAMIDOL (ISOVUE-M 200) INJECTION 41%
20.0000 mL | Freq: Once | INTRAMUSCULAR | Status: AC
  Administered 2018-10-03: 11:00:00 20 mL via INTRATHECAL

## 2018-10-03 MED ORDER — ONDANSETRON HCL 4 MG/2ML IJ SOLN: 4.0000 mg | Freq: Four times a day (QID) | INTRAMUSCULAR | Status: DC | PRN

## 2018-10-03 MED ORDER — ONDANSETRON HCL 4 MG/2ML IJ SOLN
4.0000 mg | Freq: Once | INTRAMUSCULAR | Status: AC
  Administered 2018-10-03: 4 mg via INTRAMUSCULAR

## 2018-10-03 MED ORDER — MEPERIDINE HCL 50 MG/ML IJ SOLN
50.0000 mg | Freq: Once | INTRAMUSCULAR | Status: AC
  Administered 2018-10-03: 11:00:00 50 mg via INTRAMUSCULAR

## 2018-10-03 NOTE — Discharge Instructions (Signed)

## 2019-03-15 HISTORY — PX: BACK SURGERY: SHX140

## 2019-03-20 ENCOUNTER — Ambulatory Visit: Payer: BC Managed Care – PPO | Attending: Internal Medicine

## 2019-03-20 DIAGNOSIS — Z20822 Contact with and (suspected) exposure to covid-19: Secondary | ICD-10-CM

## 2019-03-22 LAB — NOVEL CORONAVIRUS, NAA: SARS-CoV-2, NAA: NOT DETECTED

## 2019-04-01 ENCOUNTER — Other Ambulatory Visit: Payer: Self-pay

## 2019-04-01 ENCOUNTER — Ambulatory Visit: Payer: BC Managed Care – PPO | Admitting: Neurology

## 2019-04-01 ENCOUNTER — Encounter: Payer: Self-pay | Admitting: Neurology

## 2019-04-01 DIAGNOSIS — G500 Trigeminal neuralgia: Secondary | ICD-10-CM

## 2019-04-01 HISTORY — DX: Trigeminal neuralgia: G50.0

## 2019-04-01 MED ORDER — GABAPENTIN 300 MG PO CAPS: ORAL_CAPSULE | ORAL | 2 refills | Status: DC

## 2019-04-01 NOTE — Progress Notes (Signed)
Reason for visit: Left trigeminal neuralgia  Referring physician: Dr. Merlene Laughter is a 49 y.o. male  History of present illness:  Mr. Eastridge is a 49 year old right-handed white male with a history of bipolar disorder and chronic low back pain.  The patient has a spinal stimulator in place.  He is referred to this office with a history of onset of sharp pain in the left V2 distribution that began around 21 March 2019.  The patient was treated for sinus congestion and a possible sinus infection prior to onset of the pain.  When the pain began, it usually lasts only a few seconds and is very short but may last up to 5 minutes at a time.  The patient notes some sensitivity over the left face, chewing, talking, or swallowing may bring on the pain.  The patient has gone off of Vrylar and has started gabapentin within the last 4 or 5 days, he is currently taking 300 mg twice daily gabapentin.  He has not had any jabs of pain over the last 24 hours.  He does have chronic low back pain and some sciatica type pain going on the right leg.  He is being followed through a pain center for this.  The patient denies any weakness of the extremities, he denies numbness.  He denies any loss of vision but does have some slight blurred vision when the headache is severe.  He does give a prior history of migraine headaches but claims of these headaches are very different from that.  He has difficulty getting the right words out during the pain.  He denies any significant balance changes or difficulty controlling the bowels or the bladder.  He is sent to this office for an evaluation.  Past Medical History:  Diagnosis Date  . ADHD (attention deficit hyperactivity disorder)   . Anxiety   . Arthritis    knee and back  . Bipolar 1 disorder (HCC)   . Complication of anesthesia   . Constipation   . Depression    bi polar  . Head injury    as a child ;had stitches in head  . Mental disorder    Bipolar  .  PONV (postoperative nausea and vomiting)   . Testosterone deficiency     Past Surgical History:  Procedure Laterality Date  . HAND SURGERY Left    tendon repai x2  . LUMBAR LAMINECTOMY Right 04/01/2013   Procedure: MICRODISCECTOMY LUMBAR LAMINECTOMY;  Surgeon: Eldred Manges, MD;  Location: [facility] OR;  Service: Orthopedics;  Laterality: Right;  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  . LUMBAR WOUND DEBRIDEMENT N/A 07/30/2014   Procedure: LUMBAR WOUND IRRIGATION AND DEBRIDEMENT,        ;  Surgeon: Tia Alert, MD;  Location: [facility];  Service: Neurosurgery;  Laterality: N/A;  . MICRODISCECTOMY LUMBAR     L4-5  . MICRODISCECTOMY LUMBAR     L4-5  . SPINAL CORD STIMULATOR INSERTION N/A 09/25/2015   Procedure: LUMBAR SPINAL CORD STIMULATOR INSERTION;  Surgeon: Odette Fraction, MD;  Location: [facility];  Service: Neurosurgery;  Laterality: N/A;  LUMBAR SPINAL CORD STIMULATOR INSERTION  . TRANSFORAMINAL LUMBAR INTERBODY FUSION (TLIF) WITH PEDICLE SCREW FIXATION 1 LEVEL N/A 07/17/2014   Procedure: Transforaminal Lumbar Interbody Fusion Lumbar four-five with pedicle screws  ;  Surgeon: Tia Alert, MD;  Location: [facility];  Service: Neurosurgery;  Laterality: N/A;  . VASECTOMY    . WRIST  ARTHROSCOPY    . WRIST FUSION Right 1998  . WRIST SURGERY Right    reconstution    Family History  Problem Relation Age of Onset  . Hypertension Mother   . Depression Mother   . Anxiety disorder Mother   . Alcoholism Father   . Diabetes Father     Social history:  reports that he quit smoking about 5 years ago. His smoking use included cigars. He smoked 0.00 packs per day. He has never used smokeless tobacco. He reports that he does not drink alcohol or use drugs.  Medications:  Prior to Admission medications   Medication Sig Start Date End Date Taking? Authorizing Provider  acetaminophen (TYLENOL) 325 MG tablet Take 650 mg by mouth 2 (two) times daily as needed for moderate pain.   Yes  [provider]  cholecalciferol (VITAMIN D) 1000 units tablet Take 1,000 Units by mouth daily.   Yes [provider]  cyclobenzaprine (FLEXERIL) 10 MG tablet Take 1 tablet (10 mg total) by mouth 3 (three) times daily as needed for muscle spasms. 07/24/15  Yes Lindell Spar I, NP  divalproex (DEPAKOTE ER) 500 MG 24 hr tablet Take 1,000 mg by mouth at bedtime.   Yes [provider]  gabapentin (NEURONTIN) 300 MG capsule Take 300 mg by mouth 2 (two) times daily.   Yes [provider]  ibuprofen (ADVIL,MOTRIN) 200 MG tablet Take 400 mg by mouth daily as needed for moderate pain.   Yes [provider]  lamoTRIgine (LAMICTAL) 100 MG tablet Take 1 tablet (100 mg total) by mouth 2 (two) times daily. For mood stabilization 07/24/15  Yes Nwoko, Herbert Pun I, NP  Omega-3 Fatty Acids (FISH OIL) 1000 MG CAPS Take 1,000 mg by mouth 2 (two) times daily.   Yes [provider]  Testosterone 10 MG/ACT (2%) GEL Apply 60 mg topically daily. 6 pumps = 10 mg per pump for hormonal replacement 07/23/15  Yes [provider]  vitamin E 1000 UNIT capsule Take 1,000 Units by mouth at bedtime.   Yes [provider]  XTAMPZA ER 27 MG C12A Take 1 capsule by mouth 2 (two) times daily. 03/28/19  Yes [provider]  zolpidem (AMBIEN) 10 MG tablet Take 1 tablet (10 mg total) by mouth at bedtime as needed for sleep. 07/24/15 08/02/16  Encarnacion Slates, NP      Allergies  Allergen Reactions  . Trileptal [Oxcarbazepine] Other (See Comments)    Low sodium    ROS:  Out of a complete 14 system review of symptoms, the patient complains only of the following symptoms, and all other reviewed systems are negative.  Left facial pain Chronic low back pain, right leg pain  Blood pressure 117/84, pulse 94, temperature (!) 97 F (36.1 C), height 6\' 1"  (1.854 m), weight 259 lb 8 oz (117.7 kg).  Physical Exam  General: The patient is alert and cooperative at the time  of the examination.  The patient is mildly to moderately obese.  Eyes: Pupils are equal, round, and reactive to light. Discs are flat bilaterally.  Neck: The neck is supple, no carotid bruits are noted.  Respiratory: The respiratory examination is clear.  Cardiovascular: The cardiovascular examination reveals a regular rate and rhythm, no obvious murmurs or rubs are noted.  Skin: Extremities are without significant edema.  Neurologic Exam  Mental status: The patient is alert and oriented x 3 at the time of the examination. The patient has apparent normal recent and remote  memory, with an apparently normal attention span and concentration ability.  Cranial nerves: Facial symmetry is present. There is good sensation of the face to pinprick and soft touch bilaterally. The strength of the facial muscles and the muscles to head turning and shoulder shrug are normal bilaterally. Speech is well enunciated, no aphasia or dysarthria is noted. Extraocular movements are full. Visual fields are full. The tongue is midline, and the patient has symmetric elevation of the soft palate. No obvious hearing deficits are noted.  Motor: The motor testing reveals 5 over 5 strength of all 4 extremities. Good symmetric motor tone is noted throughout.  Sensory: Sensory testing is intact to pinprick, soft touch, vibration sensation, and position sense on all 4 extremities. No evidence of extinction is noted.  Coordination: Cerebellar testing reveals good finger-nose-finger and heel-to-shin bilaterally.  Gait and station: Gait is normal. Tandem gait is normal. Romberg is negative. No drift is seen.  Reflexes: Deep tendon reflexes are symmetric and normal bilaterally. Toes are downgoing bilaterally.   Assessment/Plan:  1.  Left V2 distribution pain, possible trigeminal neuralgia  2.  Chronic low back pain  The patient be set up for CT scan of the brain with and without contrast, he cannot have MRI of the  brain secondary to a spinal stimulator placement.  The patient will be increased on the gabapentin taking 300 mg the morning and 600 mg in the evening.  A prescription was sent in.  If the patient has recurrence of pain, he is to contact our office.  The pain description is somewhat atypical for trigeminal neuralgia, if the pain cannot be controlled, we may potentially try a course of indomethacin in the future.  The patient is on Lamictal currently, he has had trouble with hyponatremia on Trileptal.  He will follow-up here in 3 months.  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  Marlan Palau MD 04/01/2019 12:01 PM  [facility] [address] [place], [place] [address]  Phone [phone] Fax [phone]

## 2019-04-19 ENCOUNTER — Ambulatory Visit: Payer: BC Managed Care – PPO | Admitting: Neurology

## 2019-04-19 ENCOUNTER — Ambulatory Visit
Admission: RE | Admit: 2019-04-19 | Discharge: 2019-04-19 | Disposition: A | Discharge status: Home / Self Care | Payer: BC Managed Care – PPO | Source: Ambulatory Visit | Attending: Neurology | Admitting: Neurology

## 2019-04-19 DIAGNOSIS — G500 Trigeminal neuralgia: Secondary | ICD-10-CM

## 2019-04-19 MED ORDER — IOPAMIDOL (ISOVUE-300) INJECTION 61%
75.0000 mL | Freq: Once | INTRAVENOUS | Status: AC | PRN
  Administered 2019-04-19: 75 mL via INTRAVENOUS

## 2019-04-22 ENCOUNTER — Telehealth: Payer: Self-pay | Admitting: Neurology

## 2019-04-22 NOTE — Progress Notes (Signed)
CT head with and without contrast showed no obvious abnormality, please update patient.

## 2019-04-22 NOTE — Telephone Encounter (Signed)
CT scan of the head is unremarkable.  We will continue medication management for neuralgia pain.  I was unable to contact the patient by telephone, the call would not go through.  CT head 04/19/19:  IMPRESSION: Negative.  No explanation for symptoms.

## 2019-04-22 NOTE — Telephone Encounter (Signed)
Ct head results sent mychart for patient.

## 2019-04-22 NOTE — Telephone Encounter (Addendum)
PT call about CT scan being remarkable. He saw the results on mychart and verbalized understand.I stated per note there is no explanation of his symptoms. Pt verbalized understanding.Huston Foley, MD

## 2019-05-30 ENCOUNTER — Other Ambulatory Visit: Payer: Self-pay | Admitting: Psychiatry

## 2019-06-01 LAB — AMMONIA: Ammonia: 123 ug/dL (ref 40–200)

## 2019-06-01 LAB — VALPROIC ACID LEVEL: Valproic Acid Lvl: 51 ug/mL (ref 50–100)

## 2019-06-01 LAB — VITAMIN D 25 HYDROXY (VIT D DEFICIENCY, FRACTURES): Vit D, 25-Hydroxy: 39.9 ng/mL (ref 30.0–100.0)

## 2019-07-24 ENCOUNTER — Other Ambulatory Visit: Payer: Self-pay | Admitting: Psychiatry

## 2019-07-24 MED ORDER — LAMOTRIGINE 100 MG PO TABS: 100.0000 mg | ORAL_TABLET | Freq: Two times a day (BID) | ORAL | 1 refills | Status: DC

## 2019-08-21 ENCOUNTER — Other Ambulatory Visit: Payer: Self-pay | Admitting: Psychiatry

## 2019-10-25 ENCOUNTER — Other Ambulatory Visit: Payer: Self-pay | Admitting: Psychiatry

## 2020-04-18 ENCOUNTER — Other Ambulatory Visit: Payer: Self-pay | Admitting: Psychiatry

## 2020-05-20 NOTE — Telephone Encounter (Signed)
He does not need RF

## 2020-08-20 ENCOUNTER — Other Ambulatory Visit: Payer: Self-pay | Admitting: Psychiatry

## 2020-08-25 ENCOUNTER — Other Ambulatory Visit: Payer: Self-pay | Admitting: Psychiatry

## 2020-09-22 ENCOUNTER — Other Ambulatory Visit: Payer: Self-pay | Admitting: Psychiatry

## 2020-09-23 LAB — COMPREHENSIVE METABOLIC PANEL
ALT: 38 IU/L (ref 0–44)
AST: 18 IU/L (ref 0–40)
Albumin/Globulin Ratio: 1.9 (ref 1.2–2.2)
Albumin: 4.5 g/dL (ref 3.8–4.9)
Alkaline Phosphatase: 61 IU/L (ref 44–121)
BUN/Creatinine Ratio: 7 — ABNORMAL LOW (ref 9–20)
BUN: 6 mg/dL (ref 6–24)
Bilirubin Total: <0.2 mg/dL (ref 0.0–1.2)
CO2: 23 mmol/L (ref 20–29)
Calcium: 9.3 mg/dL (ref 8.7–10.2)
Chloride: 98 mmol/L (ref 96–106)
Creatinine, Ser: 0.88 mg/dL (ref 0.76–1.27)
Globulin, Total: 2.4 g/dL (ref 1.5–4.5)
Glucose: 102 mg/dL — ABNORMAL HIGH (ref 65–99)
Potassium: 4.4 mmol/L (ref 3.5–5.2)
Sodium: 141 mmol/L (ref 134–144)
Total Protein: 6.9 g/dL (ref 6.0–8.5)
eGFR: 104 mL/min/{1.73_m2} (ref >59)

## 2020-12-20 ENCOUNTER — Other Ambulatory Visit: Payer: Self-pay | Admitting: Psychiatry

## 2020-12-22 ENCOUNTER — Other Ambulatory Visit: Payer: Self-pay | Admitting: Psychiatry

## 2020-12-29 ENCOUNTER — Institutional Professional Consult (permissible substitution): Payer: BC Managed Care – PPO | Admitting: Neurology

## 2021-01-05 ENCOUNTER — Institutional Professional Consult (permissible substitution): Payer: BC Managed Care – PPO | Admitting: Neurology

## 2021-01-26 ENCOUNTER — Ambulatory Visit: Payer: BC Managed Care – PPO | Admitting: Neurology

## 2021-01-26 ENCOUNTER — Encounter: Payer: Self-pay | Admitting: Neurology

## 2021-01-26 VITALS — BP 145/101 | HR 100 | Ht 73.0 in | Wt 254.5 lb

## 2021-01-26 DIAGNOSIS — G9331 Postviral fatigue syndrome: Secondary | ICD-10-CM | POA: Diagnosis not present
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
## 2021-01-26 NOTE — Patient Instructions (Signed)
Chronic Fatigue Syndrome Chronic fatigue syndrome (CFS) is a condition that causes extreme tiredness (fatigue). This condition is also known as myalgic encephalomyelitis (ME). The fatigue in CFS does not improve with rest, and it gets worse with physical or mental activity. Several other symptoms may occur along with fatigue. Symptoms may come and go, but they generally last for months. Sometimes, CFS gets better over time. In other cases, it can be a lifelong condition. There is no cure, but there are many possible treatments. You will need to work with your health care providers to find a treatment plan that works best for you. What are the causes? The cause of this condition is not known. CFS may be caused by a combination of things. Possible causes include: An infection. An abnormal body defense system (abnormal immune system). Low blood pressure. Poor diet. Living with a lot of physical or emotional stress. What increases the risk? The following factors may make you more likely to develop this condition: Being male. Being 75-29 years old. Having a family history of CFS. What are the signs or symptoms? The main symptom of this condition is fatigue that is severe enough to interfere with day-to-day activities. This fatigue does not get better with rest, and it gets worse with physical or mental activity. There are eight other major symptoms of CFS: Discomfort and lack of energy (malaise) that lasts more than 24 hours after physical activity. Sleep that does not relieve fatigue (unrefreshing sleep). Short-term memory loss or confusion. Joint pain without redness or swelling. Muscle aches. Headaches. Painful and swollen glands (lymph nodes) in the neck or under the arms. Sore throat. Other symptoms can include: Cramps in the abdomen, constipation, or diarrhea (irritable bowel syndrome). Chills and night sweats. Vision changes. Dizziness and mental confusion (brain  fog). Clumsiness. Sensitivity to food, noise, or odors. Mood swings, depression, or anxiety attacks. How is this diagnosed? There are no tests that can diagnose this condition. Your health care provider will make the diagnosis based on: Your symptoms and medical history. A physical exam and a mental health exam. Tests to rule out other conditions. It is important to make sure that your symptoms are not caused by another medical condition. Tests may include lab tests or X-rays. For your health care provider to diagnose CFS: You must have had fatigue for at least 6 straight months. Fatigue must be your first symptom, and it must be severe enough to interfere with day-to-day activities. You must also have at least four of the eight other major symptoms of CFS. There must be no other cause found for the fatigue. How is this treated? There is no cure for CFS. The condition affects everyone differently. You will need to work with your team of health care providers to find the best treatments for your symptoms. Your team may include your primary care provider, physical and exercise therapists, and mental health therapists. Treatment may include: Having a regular bedtime routine to help improve your sleep. Avoiding caffeine, alcohol, and tobacco or nicotine products. Doing light exercise and stretching during the day. You may also want to try movement exercises, such as yoga or tai chi. Taking medicines to help you sleep or to relieve joint or muscle pain. Learning and practicing relaxation techniques, such as deep breathing and muscle relaxation. Using memory aids or doing brainteasers to improve memory and concentration. Getting care for your body and mental well-being, such as: Seeing a mental health therapist to evaluate and treat depression, if necessary.  Cognitive behavioral therapy (CBT). This therapy changes the way you think or act in response to the fatigue. This may help improve how you  feel. Trying massage therapy and acupuncture. Follow these instructions at home: Eating and drinking  Avoid caffeine and alcohol. Avoid heavy meals in the evening. Eat a healthy diet that includes foods such as vegetables, fruits, fish, and lean meats. Activity Rest as told by your health care provider. Avoid fatigue by pacing yourself during the day and getting enough sleep at night. Exercise regularly, as told by your health care provider. Go to bed and get up at the same time every day. Lifestyle Ask your health care provider whether you should keep a diary. Your health care provider will tell you what information to write in the diary. This may include when you have fatigue and how medicines and other behaviors or treatments help to reduce the fatigue. Consider joining a CFS support group. Avoid stress and use stress-reducing techniques that you learn in therapy. General instructions  Take over-the-counter and prescription medicines only as told by your health care provider. Do not use herbal or dietary supplements unless they are approved by your health care provider. Maintain a healthy weight. Do not use any products that contain nicotine or tobacco, such as cigarettes, e-cigarettes, and chewing tobacco. If you need help quitting, ask your health care provider. Keep all follow-up visits as told by your health care provider. This is important. Where to find more information Get more information or find a support group near you at one of these links: American Myalgic Encephalomyelitis and Chronic Fatigue Syndrome Society: ammes.org Centers for Disease Control and Prevention: http://www.wolf.info/ Contact a health care provider if: Your symptoms do not get better or they get worse. You feel angry, guilty, anxious, or depressed. Get help right away if: You have thoughts of self-harm. If you ever feel like you may hurt yourself or others, or have thoughts about taking your own life, get help  right away. Go to your nearest emergency department or: Call your local emergency services (911 in the [place] Call a suicide crisis helpline, such as the [place] at 720-058-8845 or 988 in the [place]. This is open 24 hours a day in the [place] Text the Crisis Text Line at 9047229350 (in the [place].). Summary Chronic fatigue syndrome (CFS) is a condition that causes extreme tiredness (fatigue). This fatigue does not improve with rest, and it gets worse with physical or mental activity. There is no cure for CFS. The condition affects everyone differently. You will need to work with your team of health care providers to find the best treatments for your symptoms. Exercise regularly, as told by your health care provider. Avoid stress and use stress-reducing techniques that you learn in therapy. Contact a health care provider if your symptoms do not get better or they get worse. This information is not intended to replace advice given to you by your health care provider. Make sure you discuss any questions you have with your health care provider. Document Revised: 09/23/2020 Document Reviewed: 02/11/2019 Elsevier Patient Education  2022 [employer].

## 2021-01-28 ENCOUNTER — Other Ambulatory Visit: Payer: Self-pay | Admitting: Psychiatry

## 2021-01-28 NOTE — Telephone Encounter (Signed)
Ok to send

## 2021-02-16 ENCOUNTER — Institutional Professional Consult (permissible substitution): Payer: BC Managed Care – PPO | Admitting: Neurology

## 2021-03-16 ENCOUNTER — Institutional Professional Consult (permissible substitution): Payer: BC Managed Care – PPO | Admitting: Neurology

## 2021-05-25 ENCOUNTER — Other Ambulatory Visit: Payer: Self-pay | Admitting: Psychiatry

## 2021-05-26 LAB — BASIC METABOLIC PANEL
BUN/Creatinine Ratio: 14 (ref 9–20)
BUN: 11 mg/dL (ref 6–24)
CO2: 25 mmol/L (ref 20–29)
Calcium: 9.3 mg/dL (ref 8.7–10.2)
Chloride: 100 mmol/L (ref 96–106)
Creatinine, Ser: 0.8 mg/dL (ref 0.76–1.27)
Glucose: 100 mg/dL — ABNORMAL HIGH (ref 70–99)
Potassium: 4.7 mmol/L (ref 3.5–5.2)
Sodium: 139 mmol/L (ref 134–144)
eGFR: 106 mL/min/{1.73_m2} (ref >59)

## 2021-05-26 LAB — TSH: TSH: 4.37 u[IU]/mL (ref 0.450–4.500)

## 2021-05-26 LAB — VITAMIN D 25 HYDROXY (VIT D DEFICIENCY, FRACTURES): Vit D, 25-Hydroxy: 51.9 ng/mL (ref 30.0–100.0)

## 2021-05-26 LAB — LITHIUM LEVEL: Lithium Lvl: 0.5 mmol/L (ref 0.5–1.2)

## 2021-06-10 ENCOUNTER — Other Ambulatory Visit: Payer: Self-pay | Admitting: Psychiatry

## 2021-06-11 NOTE — Telephone Encounter (Signed)
Vidit RF ?

## 2021-07-06 ENCOUNTER — Other Ambulatory Visit: Payer: Self-pay | Admitting: Psychiatry

## 2021-10-31 ENCOUNTER — Encounter (HOSPITAL_COMMUNITY): Payer: Self-pay

## 2021-10-31 ENCOUNTER — Ambulatory Visit (HOSPITAL_COMMUNITY)
Admission: EM | Admit: 2021-10-31 | Discharge: 2021-10-31 | Disposition: A | Discharge status: Home / Self Care | Payer: BC Managed Care – PPO

## 2021-10-31 DIAGNOSIS — G8929 Other chronic pain: Secondary | ICD-10-CM | POA: Diagnosis not present

## 2021-10-31 DIAGNOSIS — M5441 Lumbago with sciatica, right side: Secondary | ICD-10-CM

## 2021-10-31 MED ORDER — PREDNISONE 20 MG PO TABS: 40.0000 mg | ORAL_TABLET | Freq: Every day | ORAL | 0 refills | Status: AC

## 2021-10-31 MED ORDER — METHYLPREDNISOLONE SODIUM SUCC 125 MG IJ SOLR
125.0000 mg | Freq: Once | INTRAMUSCULAR | Status: AC
Start: 2021-10-31 — End: 2021-10-31
  Administered 2021-10-31: 125 mg via INTRAMUSCULAR

## 2021-10-31 MED ORDER — METHYLPREDNISOLONE SODIUM SUCC 125 MG IJ SOLR
INTRAMUSCULAR | Status: AC
  Filled 2021-10-31: qty 2

## 2021-10-31 MED ORDER — CYCLOBENZAPRINE HCL 10 MG PO TABS: 10.0000 mg | ORAL_TABLET | Freq: Three times a day (TID) | ORAL | 0 refills | Status: DC | PRN

## 2021-10-31 NOTE — ED Triage Notes (Signed)
Pt c/o lower back pain for about a week. The patient has history of back surgery.  Home interventions: prescribed medications (muscle relaxer from the past)

## 2021-10-31 NOTE — ED Provider Notes (Signed)
[facility]    CSN: [id number] Arrival date & time: 10/31/21  1723      History   Chief Complaint Chief Complaint  Patient presents with   Back Pain    HPI Shawn Cervantes is a 51 y.o. male.   Patient presents to urgent care for evaluation of chronic right-sided lower back pain that has increased in severity with new radiculopathy to the right ankle and numbness/tingling to the right lower extremity starting approximately 1 week ago.  Patient has been using his prescribed oxycodone from his pain management clinic at home with no relief of symptoms.  He has also been using leftover Flexeril muscle relaxer from a previous prescription without relief of symptoms.  States that his low back pain has never been this intense in the past.  He has a history of L4 and L5 spinal fusions and degenerative changes at L5 and S1 from most recent x-ray.  Pain is worse upon standing.  States that his right leg feels like it is going to give out whenever he stands up after sitting for 1 or 2 minutes.  When he stood up earlier today, his right leg "gave out on him" and he fell to the ground.  Denies loss of consciousness, dizziness, and recent trauma to the back.   Back Pain   Past Medical History:  Diagnosis Date   ADHD (attention deficit hyperactivity disorder)    Anxiety    Arthritis    knee and back   Bipolar 1 disorder (HCC)    Complication of anesthesia    Constipation    Depression    bi polar   Head injury    as a child ;had stitches in head   Mental disorder    Bipolar   PONV (postoperative nausea and vomiting)    Testosterone deficiency    Trigeminal neuralgia of left side of face 04/01/2019   Left V2    Patient Active Problem List   Diagnosis Date Noted   PVFS (postviral fatigue syndrome) 01/26/2021   Trigeminal neuralgia of left side of face 04/01/2019   Chronic pain of right knee 02/07/2018   Anxiety 01/20/2017   Low back pain 01/20/2017   Dizziness    Acute  recurrent pansinusitis    Bipolar disorder in full remission (HCC)    Hyponatremia 08/02/2016   Hypertension 08/02/2016   Vertigo 08/02/2016   Severe episode of recurrent major depressive disorder, without psychotic features (HCC)    Bipolar affective disorder, current episode mixed (HCC) 07/21/2015   Wound dehiscence 07/30/2014   S/P lumbar spinal fusion 07/17/2014   HNP (herniated nucleus pulposus), lumbar 04/01/2013    Class: Diagnosis of   Hypogonadotropic hypogonadism syndrome, 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    Past Surgical History:  Procedure Laterality Date   BACK SURGERY  2021   replaced stimulator/leads   HAND SURGERY Left    tendon repai x2   LUMBAR LAMINECTOMY Right 04/01/2013   Procedure: MICRODISCECTOMY LUMBAR LAMINECTOMY;  Surgeon: Eldred Manges, MD;  Location: [facility];  Service: Orthopedics;  Laterality: Right;  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   LUMBAR WOUND DEBRIDEMENT N/A 07/30/2014   Procedure: LUMBAR WOUND IRRIGATION AND DEBRIDEMENT,        ;  Surgeon: Tia Alert, MD;  Location: [facility];  Service: Neurosurgery;  Laterality: N/A;   MICRODISCECTOMY LUMBAR     L4-5   MICRODISCECTOMY LUMBAR     L4-5   SPINAL CORD STIMULATOR INSERTION N/A 09/25/2015  Procedure: LUMBAR SPINAL CORD STIMULATOR INSERTION;  Surgeon: Odette Fraction, MD;  Location: [facility];  Service: Neurosurgery;  Laterality: N/A;  LUMBAR SPINAL CORD STIMULATOR INSERTION   TRANSFORAMINAL LUMBAR INTERBODY FUSION (TLIF) WITH PEDICLE SCREW FIXATION 1 LEVEL N/A 07/17/2014   Procedure: Transforaminal Lumbar Interbody Fusion Lumbar four-five with pedicle screws  ;  Surgeon: Tia Alert, MD;  Location: [facility];  Service: Neurosurgery;  Laterality: N/A;   VASECTOMY     WRIST ARTHROSCOPY     WRIST FUSION Right 03/14/1996   WRIST SURGERY Right    reconstution       Home Medications    Prior to Admission medications   Medication Sig Start Date End Date Taking? Authorizing Provider   predniSONE (DELTASONE) 20 MG tablet Take 2 tablets (40 mg total) by mouth daily for 5 days. 10/31/21 11/05/21 Yes StanhopeDonavan Burnet, FNP  acetaminophen (TYLENOL) 325 MG tablet Take 650 mg by mouth 2 (two) times daily as needed for moderate pain.    [provider]  amphetamine-dextroamphetamine (ADDERALL XR) 20 MG 24 hr capsule 1 capsule in the morning    [provider]  cholecalciferol (VITAMIN D) 1000 units tablet Take 1,000 Units by mouth daily.    [provider]  cyclobenzaprine (FLEXERIL) 10 MG tablet Take 1 tablet (10 mg total) by mouth 3 (three) times daily as needed for muscle spasms. 10/31/21   Carlisle Beers, FNP  divalproex (DEPAKOTE ER) 500 MG 24 hr tablet Take 1,000 mg by mouth at bedtime.    [provider]  ibuprofen (ADVIL,MOTRIN) 200 MG tablet Take 400 mg by mouth daily as needed for moderate pain.    [provider]  lamoTRIgine (LAMICTAL) 100 MG tablet Take 1 tablet (100 mg total) by mouth daily. 08/20/20   Cottle, Steva Ready., MD  lithium carbonate (ESKALITH) 450 MG CR tablet Take 900 mg by mouth at bedtime. 05/09/21   [provider]  lumateperone tosylate (CAPLYTA) 42 MG capsule Take 42 mg by mouth daily.    [provider]  mirtazapine (REMERON) 30 MG tablet Take 30 mg by mouth at bedtime. 08/19/21   [provider]  Omega-3 Fatty Acids (FISH OIL) 1000 MG CAPS Take 1,000 mg by mouth 2 (two) times daily.    [provider]  oxyCODONE ER (XTAMPZA ER) 13.5 MG C12A Take 1 capsule by mouth every evening.    [provider]  PARoxetine (PAXIL) 20 MG tablet Take 20 mg by mouth at bedtime.    [provider]  Testosterone 10 MG/ACT (2%) GEL Apply 60 mg topically daily. 6 pumps = 10 mg per pump for hormonal replacement 07/23/15   [provider]  vitamin E 1000 UNIT capsule Take 1,000 Units by mouth at bedtime.    [provider]  zolpidem (AMBIEN) 10 MG tablet Take  1 tablet (10 mg total) by mouth at bedtime as needed for sleep. 07/24/15 08/02/16  Sanjuana Kava, NP    Family History Family History  Problem Relation Age of Onset   Hypertension Mother    Depression Mother    Anxiety disorder Mother    Alcoholism Father    Diabetes Father     Social History Social History   Tobacco Use   Smoking status: Former    Packs/day: 0.00    Types: Cigars, Cigarettes    Quit date: 10/14/2013    Years since quitting: 8.0   Smokeless tobacco: Never  Substance Use Topics  Alcohol use: No   Drug use: No     Allergies   Trileptal [oxcarbazepine]   Review of Systems Review of Systems  Musculoskeletal:  Positive for back pain.     Physical Exam Triage Vital Signs ED Triage Vitals  Enc Vitals Group     BP 10/31/21 1837 (!) 135/99     Pulse Rate 10/31/21 1837 86     Resp 10/31/21 1837 20     Temp 10/31/21 1837 98 F (36.7 C)     Temp Source 10/31/21 1837 Oral     SpO2 10/31/21 1837 97 %     Weight --      Height --      Head Circumference --      Peak Flow --      Pain Score 10/31/21 1836 9     Pain Loc --      Pain Edu? --      Excl. in GC? --    No data found.  Updated Vital Signs BP (!) 135/99 (BP Location: Left Arm)   Pulse 86   Temp 98 F (36.7 C) (Oral)   Resp 20   SpO2 97%   Visual Acuity Right Eye Distance:   Left Eye Distance:   Bilateral Distance:    Right Eye Near:   Left Eye Near:    Bilateral Near:     Physical Exam   UC Treatments / Results  Labs (all labs ordered are listed, but only abnormal results are displayed) Labs Reviewed - No data to display  EKG   Radiology No results found.  Procedures Procedures (including critical care time)  Medications Ordered in UC Medications  methylPREDNISolone sodium succinate (SOLU-MEDROL) 125 mg/2 mL injection 125 mg (125 mg Intramuscular Given 10/31/21 1930)    Initial Impression / Assessment and Plan / UC Course  I have reviewed the triage vital  signs and the nursing notes.  Pertinent labs & imaging results that were available during my care of the patient were reviewed by me and considered in my medical decision making (see chart for details).     *** Final Clinical Impressions(s) / UC Diagnoses   Final diagnoses:  Chronic right-sided low back pain with right-sided sciatica     Discharge Instructions      We gave you a steroid injection in the clinic today. Start taking prednisone tomorrow morning.  Take 40 mg of prednisone once daily for the next 5 days in the morning with breakfast.  Take this medicine with food to avoid stomach upset.   You may take Flexeril muscle relaxer every 8 hours as needed.  Do not drive or drink alcohol when taking this as it can make you sleepy.   Call your neurosurgeon for follow-up appointment.  The phone number is listed on your paperwork.  Call the pain management clinic tomorrow to schedule an appointment.   Return to urgent care as needed.  Go to the emergency room if you develop any new or severe symptoms. I hope you feel better!     ED Prescriptions     Medication Sig Dispense Auth. Provider   predniSONE (DELTASONE) 20 MG tablet Take 2 tablets (40 mg total) by mouth daily for 5 days. 10 tablet Carlisle Beers, FNP   cyclobenzaprine (FLEXERIL) 10 MG tablet Take 1 tablet (10 mg total) by mouth 3 (three) times daily as needed for muscle spasms. 1 tablet Carlisle Beers, FNP      PDMP not reviewed  this encounter.

## 2021-10-31 NOTE — Discharge Instructions (Addendum)
We gave you a steroid injection in the clinic today. Start taking prednisone tomorrow morning.  Take 40 mg of prednisone once daily for the next 5 days in the morning with breakfast.  Take this medicine with food to avoid stomach upset.   You may take Flexeril muscle relaxer every 8 hours as needed.  Do not drive or drink alcohol when taking this as it can make you sleepy.   Call your neurosurgeon for follow-up appointment.  The phone number is listed on your paperwork.  Call the pain management clinic tomorrow to schedule an appointment.   Return to urgent care as needed.  Go to the emergency room if you develop any new or severe symptoms. I hope you feel better!

## 2021-11-09 ENCOUNTER — Other Ambulatory Visit: Payer: Self-pay | Admitting: Psychiatry

## 2021-11-21 ENCOUNTER — Telehealth: Payer: Self-pay

## 2021-11-21 NOTE — Telephone Encounter (Signed)
Prior Authorization submitted and approved for AMPHETAMINE-DEXTROAMPHETAMINE XR 20 MG CAPS effective 11/20/2021-11/20/2024 with [facility]

## 2022-01-06 ENCOUNTER — Telehealth: Payer: Self-pay

## 2022-01-11 NOTE — Telephone Encounter (Signed)
Prior Approval received for Methylphenidate ER 54 mg effective 01/06/2022-01/06/2025 with [facility]

## 2022-01-13 ENCOUNTER — Other Ambulatory Visit: Payer: Self-pay | Admitting: Psychiatry

## 2022-01-13 MED ORDER — VORTIOXETINE HBR 10 MG PO TABS: 10.0000 mg | ORAL_TABLET | Freq: Every day | ORAL | 1 refills | Status: DC

## 2022-01-13 NOTE — Progress Notes (Signed)
Pt is on Trintellix 10 mg and needs refilll

## 2022-02-06 ENCOUNTER — Other Ambulatory Visit: Payer: Self-pay | Admitting: Psychiatry

## 2022-02-07 ENCOUNTER — Other Ambulatory Visit: Payer: Self-pay | Admitting: Psychiatry

## 2022-02-07 MED ORDER — MIRTAZAPINE 30 MG PO TABS: 30.0000 mg | ORAL_TABLET | Freq: Every day | ORAL | 0 refills | Status: DC

## 2022-02-07 NOTE — Progress Notes (Signed)
Pt has appt Friday.  Needs refill mirtazapine

## 2022-02-11 ENCOUNTER — Ambulatory Visit (INDEPENDENT_AMBULATORY_CARE_PROVIDER_SITE_OTHER): Payer: BC Managed Care – PPO | Admitting: Psychiatry

## 2022-02-11 ENCOUNTER — Encounter: Payer: Self-pay | Admitting: Psychiatry

## 2022-02-11 DIAGNOSIS — F411 Generalized anxiety disorder: Secondary | ICD-10-CM | POA: Diagnosis not present

## 2022-02-11 DIAGNOSIS — F314 Bipolar disorder, current episode depressed, severe, without psychotic features: Secondary | ICD-10-CM | POA: Diagnosis not present

## 2022-02-11 DIAGNOSIS — F900 Attention-deficit hyperactivity disorder, predominantly inattentive type: Secondary | ICD-10-CM | POA: Diagnosis not present

## 2022-02-11 DIAGNOSIS — F5105 Insomnia due to other mental disorder: Secondary | ICD-10-CM

## 2022-02-11 MED ORDER — METHYLPHENIDATE HCL ER (OSM) 36 MG PO TBCR: 72.0000 mg | EXTENDED_RELEASE_TABLET | Freq: Every morning | ORAL | 0 refills | Status: DC

## 2022-02-11 MED ORDER — VORTIOXETINE HBR 20 MG PO TABS: 20.0000 mg | ORAL_TABLET | Freq: Every day | ORAL | 1 refills | Status: DC

## 2022-02-11 MED ORDER — MIRTAZAPINE 30 MG PO TABS: 30.0000 mg | ORAL_TABLET | Freq: Every day | ORAL | 0 refills | Status: DC

## 2022-02-11 MED ORDER — LITHIUM CARBONATE ER 450 MG PO TBCR: 450.0000 mg | EXTENDED_RELEASE_TABLET | Freq: Every day | ORAL | 1 refills | Status: DC

## 2022-02-11 MED ORDER — DIVALPROEX SODIUM ER 500 MG PO TB24: 1000.0000 mg | ORAL_TABLET | Freq: Every day | ORAL | 1 refills | Status: DC

## 2022-02-11 NOTE — Progress Notes (Signed)
Shawn Cervantes [id number] [birth year] 51 y.o.  Virtual Visit via Telephone Note  I connected with pt by telephone and verified that I am speaking with the correct person using two identifiers.   I discussed the limitations, risks, security and privacy concerns of performing an evaluation and management service by telephone and the availability of in person appointments. I also discussed with the patient that there may be a patient responsible charge related to this service. The patient expressed understanding and agreed to proceed.  I discussed the assessment and treatment plan with the patient. The patient was provided an opportunity to ask questions and all were answered. The patient agreed with the plan and demonstrated an understanding of the instructions.   The patient was advised to call back or seek an in-person evaluation if the symptoms worsen or if the condition fails to improve as anticipated.  I provided 30 minutes of non-face-to-face time during this encounter. The call started at 3:30 PM and ended at 4 PM. The patient was located at home and the provider was located office.   Subjective:   [name] is a 51 y.o. (DOB 1971/03/05) male.  Chief Complaint:  Chief Complaint  Patient presents with   Follow-up   Depression   Anxiety   ADD    HPI Shawn Cervantes for follow-up of bipolar depression and panic disorder and ADD.  On 12/24/21 started Trintellix and up to 10 mg daily.  And changed Adderall to concerta 54 mg Am No sig effect from Concerta 54. Maybe a little loss of energy with switch. NoSE with it. NO SE with trintellix change. No change in energy.   F in law died.   Maybe more depressed than he's gotten used to being.  Hard to make himself go out.  Can't fake his way through it right now. Anxiety settled after adjusted to Trintellix but still more.  Anxiety about the same as when on Paxil.  Sleep pattern is same but if  anything more sleep than normal.  But some days EFA.  Harder to wake in the morning.   Mirtazapine plus Quiviq helps initial insomnia.   Past Psychiatric Medication Trials:   Flowsheet Row ED from 10/31/2021 in [facility] RISK CATEGORY No Risk        Review of Systems:  Review of Systems  Musculoskeletal:  Positive for back pain.  Neurological:  Negative for weakness.  Psychiatric/Behavioral:  Positive for decreased concentration, dysphoric mood and sleep disturbance. The patient is nervous/anxious.     Medications: I have reviewed the patient's current medications.  Current Outpatient Medications  Medication Sig Dispense Refill   acetaminophen (TYLENOL) 325 MG tablet Take 650 mg by mouth 2 (two) times daily as needed for moderate pain.     cholecalciferol (VITAMIN D) 1000 units tablet Take 1,000 Units by mouth daily.     cyclobenzaprine (FLEXERIL) 10 MG tablet Take 1 tablet (10 mg total) by mouth 3 (three) times daily as needed for muscle spasms. (Patient taking differently: Take 10 mg by mouth at bedtime.) 1 tablet 0   Daridorexant HCl (QUVIVIQ) 50 MG TABS Take 50 mg by mouth at bedtime.     ibuprofen (ADVIL,MOTRIN) 200 MG tablet Take 400 mg by mouth daily as needed for moderate pain.     lamoTRIgine (LAMICTAL) 100 MG tablet TAKE 1 TABLET BY MOUTH TWICE A DAY (Patient taking differently: Take 100 mg  by mouth daily.) 180 tablet 0   Omega-3 Fatty Acids (FISH OIL) 1000 MG CAPS Take 1,000 mg by mouth 2 (two) times daily.     oxyCODONE ER (XTAMPZA ER) 13.5 MG C12A Take 1 capsule by mouth every evening.     Testosterone 10 MG/ACT (2%) GEL Apply 60 mg topically daily. 6 pumps = 10 mg per pump for hormonal replacement     vitamin E 1000 UNIT capsule Take 1,000 Units by mouth at bedtime.     amphetamine-dextroamphetamine (ADDERALL XR) 20 MG 24 hr capsule 1 capsule in the morning (Patient not taking: Reported on 02/11/2022)     divalproex (DEPAKOTE ER) 500  MG 24 hr tablet Take 2 tablets (1,000 mg total) by mouth at bedtime. 180 tablet 1   lithium carbonate (ESKALITH) 450 MG CR tablet Take 1 tablet (450 mg total) by mouth at bedtime. 90 tablet 1   methylphenidate 36 MG PO CR tablet Take 2 tablets (72 mg total) by mouth every morning. 60 tablet 0   mirtazapine (REMERON) 30 MG tablet Take 1 tablet (30 mg total) by mouth at bedtime. 90 tablet 0   vortioxetine HBr (TRINTELLIX) 20 MG TABS tablet Take 1 tablet (20 mg total) by mouth daily. 30 tablet 1   zolpidem (AMBIEN) 10 MG tablet Take 1 tablet (10 mg total) by mouth at bedtime as needed for sleep. 7 tablet 0   No current facility-administered medications for this visit.    Medication Side Effects: None  Allergies:  Allergies  Allergen Reactions   Trileptal [Oxcarbazepine] Other (See Comments)    Low sodium    Past Medical History:  Diagnosis Date   ADHD (attention deficit hyperactivity disorder)    Anxiety    Arthritis    knee and back   Bipolar 1 disorder (HCC)    Complication of anesthesia    Constipation    Depression    bi polar   Head injury    as a child ;had stitches in head   Mental disorder    Bipolar   PONV (postoperative nausea and vomiting)    Testosterone deficiency    Trigeminal neuralgia of left side of face 04/01/2019   Left V2    Past Medical History, Surgical history, Social history, and Family history were reviewed and updated as appropriate.   Please see review of systems for further details on the patient's review from Cervantes.   Objective:   Physical Exam:  There were no vitals taken for this visit.  Physical Exam Neurological:     Mental Status: He is alert and oriented to person, place, and time.     Cranial Nerves: No dysarthria.  Psychiatric:        Attention and Perception: Attention and perception normal.        Mood and Affect: Mood is depressed. Affect is blunt.        Speech: Speech normal.        Behavior: Behavior is cooperative.         Thought Content: Thought content normal. Thought content is not paranoid or delusional. Thought content does not include homicidal or suicidal ideation. Thought content does not include suicidal plan.        Cognition and Memory: Cognition and memory normal.        Judgment: Judgment normal.     Comments: Insight intact     Lab Review:     Component Value Date/Time   NA 139 05/25/2021 1501   K  4.7 05/25/2021 1501   CL 100 05/25/2021 1501   CO2 25 05/25/2021 1501   GLUCOSE 100 (H) 05/25/2021 1501   GLUCOSE 115 (H) 08/02/2016 1416   BUN 11 05/25/2021 1501   CREATININE 0.80 05/25/2021 1501   CALCIUM 9.3 05/25/2021 1501   PROT 6.9 09/22/2020 0927   ALBUMIN 4.5 09/22/2020 0927   AST 18 09/22/2020 0927   ALT 38 09/22/2020 0927   ALKPHOS 61 09/22/2020 0927   BILITOT <0.2 09/22/2020 0927   GFRNONAA >60 08/02/2016 1416   GFRAA >60 08/02/2016 1416       Component Value Date/Time   WBC 5.5 08/02/2016 1135   RBC 4.94 08/02/2016 1135   HGB 15.4 08/02/2016 1135   HCT 42.5 08/02/2016 1135   PLT 262 08/02/2016 1135   MCV 86.0 08/02/2016 1135   MCH 31.2 08/02/2016 1135   MCHC 36.2 (H) 08/02/2016 1135   RDW 12.3 08/02/2016 1135   LYMPHSABS 1.3 08/02/2016 1135   MONOABS 0.4 08/02/2016 1135   EOSABS 0.0 08/02/2016 1135   BASOSABS 0.0 08/02/2016 1135    Lithium Lvl  Date Value Ref Range Status  05/25/2021 0.5 0.5 - 1.2 mmol/L Final    Comment:    A concentration of 0.5-0.8 mmol/L is advised for long-term use; concentrations of up to 1.2 mmol/L may be necessary during acute treatment.                                  Detection Limit = 0.1                           <0.1 indicates None Detected      Lab Results  Component Value Date   VALPROATE 51 05/30/2019     .res Assessment: Plan:    Cortlan was seen Cervantes for follow-up, depression, anxiety and add.  Diagnoses and all orders for this visit:  Bipolar disorder with severe depression (HCC) -     vortioxetine HBr  (TRINTELLIX) 20 MG TABS tablet; Take 1 tablet (20 mg total) by mouth daily. -     divalproex (DEPAKOTE ER) 500 MG 24 hr tablet; Take 2 tablets (1,000 mg total) by mouth at bedtime. -     lithium carbonate (ESKALITH) 450 MG CR tablet; Take 1 tablet (450 mg total) by mouth at bedtime. -     mirtazapine (REMERON) 30 MG tablet; Take 1 tablet (30 mg total) by mouth at bedtime.  Attention deficit hyperactivity disorder (ADHD), predominantly inattentive type -     methylphenidate 36 MG PO CR tablet; Take 2 tablets (72 mg total) by mouth every morning.  Generalized anxiety disorder -     vortioxetine HBr (TRINTELLIX) 20 MG TABS tablet; Take 1 tablet (20 mg total) by mouth daily.  Insomnia due to mental condition    Greater than 50% of 30 min non face to face time with patient was spent on counseling and coordination of care. 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  Multiple medication failures. At this time generalized anxiety  managed. Insomnia is a chronic intermittent problem.  Discussed potential benefits, risks, and side effects of stimulants with patient to include increased heart rate, palpitations, insomnia, increased anxiety, increased irritability, or decreased appetite.  Instructed patient to contact office if experiencing any significant tolerability issues.  Cont other meds  Increase Concerta from 54 mg to 72 AM bc no response and  is being used for ADD but also off label for treatment resistant bipolar depression.  Increase Trintellix to 20 mg daily for treatment resistant bipolar depression  Follow-up 1 month  Discussed side effects of each medication.  Meredith Staggers MD, DFAPA   Please see After Visit Summary for patient specific instructions.  No future appointments.  No orders of the defined types were placed in this encounter.   -------------------------------bi

## 2022-02-15 ENCOUNTER — Telehealth: Payer: Self-pay

## 2022-02-15 NOTE — Telephone Encounter (Signed)
Prior Authorization Methylphenidate HCl ER TBCR 36MG  tablets #[employer]  Approved Effective:  02/15/2022 - 02/15/2025

## 2022-03-03 ENCOUNTER — Other Ambulatory Visit: Payer: Self-pay | Admitting: Psychiatry

## 2022-03-03 MED ORDER — QUVIVIQ 50 MG PO TABS: 50.0000 mg | ORAL_TABLET | Freq: Every evening | ORAL | 2 refills | Status: DC

## 2022-03-03 MED ORDER — VITAMIN D (ERGOCALCIFEROL) 1.25 MG (50000 UNIT) PO CAPS: 50000.0000 [IU] | ORAL_CAPSULE | ORAL | 1 refills | Status: DC

## 2022-03-18 ENCOUNTER — Encounter: Payer: Self-pay | Admitting: Psychiatry

## 2022-03-18 ENCOUNTER — Other Ambulatory Visit (HOSPITAL_COMMUNITY): Payer: Self-pay

## 2022-03-18 ENCOUNTER — Telehealth: Payer: Commercial Managed Care - PPO | Admitting: Psychiatry

## 2022-03-18 DIAGNOSIS — F411 Generalized anxiety disorder: Secondary | ICD-10-CM | POA: Diagnosis not present

## 2022-03-18 DIAGNOSIS — F314 Bipolar disorder, current episode depressed, severe, without psychotic features: Secondary | ICD-10-CM | POA: Diagnosis not present

## 2022-03-18 DIAGNOSIS — F900 Attention-deficit hyperactivity disorder, predominantly inattentive type: Secondary | ICD-10-CM

## 2022-03-18 DIAGNOSIS — F5105 Insomnia due to other mental disorder: Secondary | ICD-10-CM

## 2022-03-18 MED ORDER — ZOLPIDEM TARTRATE 10 MG PO TABS: 10.0000 mg | ORAL_TABLET | Freq: Every evening | ORAL | 0 refills | Status: DC | PRN

## 2022-03-18 MED ORDER — QUVIVIQ 50 MG PO TABS
1.0000 | ORAL_TABLET | Freq: Every evening | ORAL | 2 refills | Status: DC
  Filled 2022-03-18: qty 30, 30d supply, fill #0

## 2022-03-18 MED ORDER — AMPHETAMINE-DEXTROAMPHET ER 20 MG PO CP24: 20.0000 mg | ORAL_CAPSULE | Freq: Every day | ORAL | 0 refills | Status: DC

## 2022-03-18 MED ORDER — AMPHETAMINE-DEXTROAMPHET ER 20 MG PO CP24
20.0000 mg | ORAL_CAPSULE | Freq: Every day | ORAL | 0 refills | Status: DC
  Filled 2022-03-18: qty 30, 30d supply, fill #0

## 2022-03-18 NOTE — Progress Notes (Signed)
Shawn Cervantes [id number] 1970-11-10 52 y.o.  Video Visit via My Chart  I connected with pt by video using My Chart and verified that I am speaking with the correct person using two identifiers.   I discussed the limitations, risks, security and privacy concerns of performing an evaluation and management service by My Chart  and the availability of in person appointments. I also discussed with the patient that there may be a patient responsible charge related to this service. The patient expressed understanding and agreed to proceed.  I discussed the assessment and treatment plan with the patient. The patient was provided an opportunity to ask questions and all were answered. The patient agreed with the plan and demonstrated an understanding of the instructions.   The patient was advised to call back or seek an in-person evaluation if the symptoms worsen or if the condition fails to improve as anticipated.  I provided 30 minutes of video time during this encounter.  The patient was located at home and the provider was located office. Session from 400-430 pm  Subjective:   [name] is a 52 y.o. (DOB Jan 10, 1971) male.  Chief Complaint:  Chief Complaint  Patient presents with   Follow-up    Bipolar disorder with severe depression ([place])    Depression    HPI Shawn Cervantes presents to the office today for follow-up of bipolar depression and panic disorder and ADD.  On 12/24/21 started Trintellix and up to 10 mg daily.  And changed Adderall to concerta 54 mg Am No sig effect from Concerta 54. Maybe a little loss of energy with switch. NoSE with it. NO SE with trintellix change. No change in energy.   F in law died.   Maybe more depressed than he's gotten used to being.  Hard to make himself go out.  Can't fake his way through it right now. Anxiety settled after adjusted to Trintellix but still more.  Anxiety about the same as when on Paxil.  Sleep pattern is same but  if anything more sleep than normal.  But some days EFA.  Harder to wake in the morning.   Mirtazapine plus Quiviq helps initial insomnia.  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 Increase Trintellix to 20 mg daily for treatment resistant bipolar depression  03/18/22 appt noted: Sleep better off Xtampza and on morphine ER 15 BID. No benefit or SE with Trintellix 20 mg daily. Anxiety is ok but not great.  Not the thing that's keeping me from moving DT depression and fatigue. Chronic apathy so why bother doing anything.  Will get anxious thinking about the downward spiral. Concerta 72 mg no better than Adderall.  Doesn't take it daily bc doesn't seem to help.  Stopped it for a couple of weeks.  Did take it consistently at 54 mg .  Didn't notice much from the 72 mg being different. Thinks he originally got benefit from Adderall. Has decided the pain meds make him tired and apathetic and wants to try to get off pain meds.   Sleep with Ambien occ.   Past Psychiatric Medication Trials: Depakote 1000 Oxcarbazepine hyponatremia Lamotrigine 100 mg twice daily Lithium 900 tremor Saphris 20 Seroquel 600 sedation Olanzapine Vraylar 3 mg no response Caplyta restlessness Latuda questionable dose, brief and possibly agitated Pramipexole no response and restless Rexulti Paroxetine 40 sleepy, fluoxetine, nortriptyline  anxiety, Wellbutrin no response, Auvelity no response  Gabapentin 4000 mg no response Clonazepam 0.5 mg twice daily, alprazolam sedation Trazodone no response Ambien and Ambien CR, hydroxyzine, hydroxyzine, Lunesta, mirtazapine 30, Thorazine 25 side effects of night eating Lyrica Adderall, modafinil 300, Concerta 72 mg AM Psychiatric hospitalization in 1997   Flowsheet Row ED from 10/31/2021 in [facility] RISK CATEGORY No Risk         Review of Systems:  Review of Systems  Constitutional:  Positive for fatigue.  Musculoskeletal:  Positive for back pain.  Neurological:  Negative for weakness.  Psychiatric/Behavioral:  Positive for decreased concentration, dysphoric mood and sleep disturbance. The patient is nervous/anxious.     Medications: I have reviewed the patient's current medications.  Current Outpatient Medications  Medication Sig Dispense Refill   cyclobenzaprine (FLEXERIL) 10 MG tablet Take 1 tablet (10 mg total) by mouth 3 (three) times daily as needed for muscle spasms. (Patient taking differently: Take 10 mg by mouth at bedtime.) 1 tablet 0   divalproex (DEPAKOTE ER) 500 MG 24 hr tablet Take 2 tablets (1,000 mg total) by mouth at bedtime. 180 tablet 1   lamoTRIgine (LAMICTAL) 100 MG tablet TAKE 1 TABLET BY MOUTH TWICE A DAY (Patient taking differently: Take 100 mg by mouth daily.) 180 tablet 0   lithium carbonate (ESKALITH) 450 MG CR tablet Take 1 tablet (450 mg total) by mouth at bedtime. 90 tablet 1   mirtazapine (REMERON) 30 MG tablet Take 1 tablet (30 mg total) by mouth at bedtime. 90 tablet 0   morphine (MS CONTIN) 15 MG 12 hr tablet Take 15 mg by mouth every 12 (twelve) hours.     Testosterone 10 MG/ACT (2%) GEL Apply 60 mg topically daily. 6 pumps = 10 mg per pump for hormonal replacement     Vitamin D, Ergocalciferol, (DRISDOL) 1.25 MG (50000 UNIT) CAPS capsule Take 1 capsule (50,000 Units total) by mouth every 7 (seven) days. 15 capsule 1   vortioxetine HBr (TRINTELLIX) 20 MG TABS tablet Take 1 tablet (20 mg total) by mouth daily. 30 tablet 1   acetaminophen (TYLENOL) 325 MG tablet Take 650 mg by mouth 2 (two) times daily as needed for moderate pain.     amphetamine-dextroamphetamine (ADDERALL XR) 20 MG 24 hr capsule Take 1 capsule (20 mg total) by mouth daily. 30 capsule 0   Daridorexant HCl (QUVIVIQ) 50 MG TABS Take 50 mg by mouth at bedtime. 30 tablet 2   ibuprofen (ADVIL,MOTRIN) 200 MG tablet  Take 400 mg by mouth daily as needed for moderate pain.     Omega-3 Fatty Acids (FISH OIL) 1000 MG CAPS Take 1,000 mg by mouth 2 (two) times daily.     oxyCODONE ER (XTAMPZA ER) 13.5 MG C12A Take 1 capsule by mouth every evening. (Patient not taking: Reported on 03/18/2022)     vitamin E 1000 UNIT capsule Take 1,000 Units by mouth at bedtime.     zolpidem (AMBIEN) 10 MG tablet Take 1 tablet (10 mg total) by mouth at bedtime as needed for sleep. 15 tablet 0   No current facility-administered medications for this visit.    Medication Side Effects: None  Allergies:  Allergies  Allergen Reactions   Trileptal [Oxcarbazepine] Other (See Comments)    Low sodium    Past Medical History:  Diagnosis Date   ADHD (attention deficit hyperactivity disorder)    Anxiety    Arthritis    knee and back  Bipolar 1 disorder (HCC)    Complication of anesthesia    Constipation    Depression    bi polar   Head injury    as a child ;had stitches in head   Mental disorder    Bipolar   PONV (postoperative nausea and vomiting)    Testosterone deficiency    Trigeminal neuralgia of left side of face 04/01/2019   Left V2    Past Medical History, Surgical history, Social history, and Family history were reviewed and updated as appropriate.   Please see review of systems for further details on the patient's review from today.   Objective:   Physical Exam:  There were no vitals taken for this visit.  Physical Exam Neurological:     Mental Status: He is alert and oriented to person, place, and time.     Cranial Nerves: No dysarthria.  Psychiatric:        Attention and Perception: Attention and perception normal.        Mood and Affect: Mood is anxious and depressed. Affect is blunt.        Speech: Speech normal.        Behavior: Behavior is cooperative.        Thought Content: Thought content normal. Thought content is not paranoid or delusional. Thought content does not include homicidal or  suicidal ideation. Thought content does not include suicidal plan.        Cognition and Memory: Cognition and memory normal.        Judgment: Judgment normal.     Comments: Insight intact     Lab Review:     Component Value Date/Time   NA 139 05/25/2021 1501   K 4.7 05/25/2021 1501   CL 100 05/25/2021 1501   CO2 25 05/25/2021 1501   GLUCOSE 100 (H) 05/25/2021 1501   GLUCOSE 115 (H) 08/02/2016 1416   BUN 11 05/25/2021 1501   CREATININE 0.80 05/25/2021 1501   CALCIUM 9.3 05/25/2021 1501   PROT 6.9 09/22/2020 0927   ALBUMIN 4.5 09/22/2020 0927   AST 18 09/22/2020 0927   ALT 38 09/22/2020 0927   ALKPHOS 61 09/22/2020 0927   BILITOT <0.2 09/22/2020 0927   GFRNONAA >60 08/02/2016 1416   GFRAA >60 08/02/2016 1416       Component Value Date/Time   WBC 5.5 08/02/2016 1135   RBC 4.94 08/02/2016 1135   HGB 15.4 08/02/2016 1135   HCT 42.5 08/02/2016 1135   PLT 262 08/02/2016 1135   MCV 86.0 08/02/2016 1135   MCH 31.2 08/02/2016 1135   MCHC 36.2 (H) 08/02/2016 1135   RDW 12.3 08/02/2016 1135   LYMPHSABS 1.3 08/02/2016 1135   MONOABS 0.4 08/02/2016 1135   EOSABS 0.0 08/02/2016 1135   BASOSABS 0.0 08/02/2016 1135    Lithium Lvl  Date Value Ref Range Status  05/25/2021 0.5 0.5 - 1.2 mmol/L Final    Comment:    A concentration of 0.5-0.8 mmol/L is advised for long-term use; concentrations of up to 1.2 mmol/L may be necessary during acute treatment.                                  Detection Limit = 0.1                           <0.1 indicates None Detected      Lab Results  Component Value Date   VALPROATE 51 05/30/2019     .res Assessment: Plan:    Chirag was seen today for follow-up and depression.  Diagnoses and all orders for this visit:  Bipolar disorder with severe depression ([place])  Attention deficit hyperactivity disorder (ADHD), predominantly inattentive type -     Discontinue: amphetamine-dextroamphetamine (ADDERALL XR) 20 MG 24 hr capsule; Take 1  capsule (20 mg total) by mouth daily. -     amphetamine-dextroamphetamine (ADDERALL XR) 20 MG 24 hr capsule; Take 1 capsule (20 mg total) by mouth daily.  Generalized anxiety disorder  Insomnia due to mental condition -     Daridorexant HCl (QUVIVIQ) 50 MG TABS; Take 50 mg by mouth at bedtime. -     zolpidem (AMBIEN) 10 MG tablet; Take 1 tablet (10 mg total) by mouth at bedtime as needed for sleep.    Greater than 50% of 30 min non face to face time with patient was spent on counseling and coordination of care. 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  Multiple medication failures. At this time generalized anxiety  managed. Insomnia is a chronic intermittent problem.  Discussed potential benefits, risks, and side effects of stimulants with patient to include increased heart rate, palpitations, insomnia, increased anxiety, increased irritability, or decreased appetite.  Instructed patient to contact office if experiencing any significant tolerability issues.  Cont other meds  Concerta without help so return to Adderall.    Increase Trintellix to 30 mg daily for treatment resistant bipolar depression for 2 weeks.  If NR stop it.  This is off label.  Continue mirtazapine 30 mg HS.   OK prn zolpidem  Rec counseling.  Last was a little online in 2022.    Follow-up 1 month  Discussed side effects of each medication.  Lynder Parents MD, DFAPA   Please see After Visit Summary for patient specific instructions.  No future appointments.  No orders of the defined types were placed in this encounter.   -------------------------------bi

## 2022-03-19 ENCOUNTER — Other Ambulatory Visit (HOSPITAL_COMMUNITY): Payer: Self-pay

## 2022-03-22 ENCOUNTER — Other Ambulatory Visit (HOSPITAL_COMMUNITY): Payer: Self-pay

## 2022-03-22 ENCOUNTER — Other Ambulatory Visit: Payer: Self-pay | Admitting: Psychiatry

## 2022-03-22 DIAGNOSIS — F5105 Insomnia due to other mental disorder: Secondary | ICD-10-CM

## 2022-03-22 MED ORDER — QUVIVIQ 50 MG PO TABS
1.0000 | ORAL_TABLET | Freq: Every evening | ORAL | 0 refills | Status: DC
  Filled 2022-03-22: qty 30, 30d supply, fill #0

## 2022-03-23 ENCOUNTER — Other Ambulatory Visit (HOSPITAL_COMMUNITY): Payer: Self-pay

## 2022-03-24 ENCOUNTER — Other Ambulatory Visit (HOSPITAL_COMMUNITY): Payer: Self-pay

## 2022-04-06 ENCOUNTER — Other Ambulatory Visit (HOSPITAL_COMMUNITY): Payer: Self-pay

## 2022-04-06 DIAGNOSIS — M961 Postlaminectomy syndrome, not elsewhere classified: Secondary | ICD-10-CM | POA: Diagnosis not present

## 2022-04-06 DIAGNOSIS — M47816 Spondylosis without myelopathy or radiculopathy, lumbar region: Secondary | ICD-10-CM | POA: Diagnosis not present

## 2022-04-06 DIAGNOSIS — G4700 Insomnia, unspecified: Secondary | ICD-10-CM | POA: Diagnosis not present

## 2022-04-06 DIAGNOSIS — G894 Chronic pain syndrome: Secondary | ICD-10-CM | POA: Diagnosis not present

## 2022-04-06 MED ORDER — CYCLOBENZAPRINE HCL 10 MG PO TABS
10.0000 mg | ORAL_TABLET | Freq: Every evening | ORAL | 1 refills | Status: DC
  Filled 2022-04-06: qty 30, 30d supply, fill #0
  Filled 2022-04-21 – 2022-05-11 (×4): qty 30, 30d supply, fill #1

## 2022-04-06 MED ORDER — MORPHINE SULFATE 15 MG PO TABS
15.0000 mg | ORAL_TABLET | Freq: Four times a day (QID) | ORAL | 0 refills | Status: DC | PRN
  Filled 2022-04-06: qty 120, 30d supply, fill #0

## 2022-04-06 MED ORDER — LIDOCAINE 5 % EX PTCH
1.0000 | MEDICATED_PATCH | CUTANEOUS | 0 refills | Status: DC
  Filled 2022-04-06 – 2022-04-26 (×2): qty 270, 90d supply, fill #0

## 2022-04-08 ENCOUNTER — Telehealth (INDEPENDENT_AMBULATORY_CARE_PROVIDER_SITE_OTHER): Payer: Commercial Managed Care - PPO | Admitting: Psychiatry

## 2022-04-08 ENCOUNTER — Other Ambulatory Visit: Payer: Self-pay | Admitting: Psychiatry

## 2022-04-08 ENCOUNTER — Encounter: Payer: Self-pay | Admitting: Psychiatry

## 2022-04-08 ENCOUNTER — Other Ambulatory Visit (HOSPITAL_COMMUNITY): Payer: Self-pay

## 2022-04-08 DIAGNOSIS — F314 Bipolar disorder, current episode depressed, severe, without psychotic features: Secondary | ICD-10-CM

## 2022-04-08 DIAGNOSIS — F5105 Insomnia due to other mental disorder: Secondary | ICD-10-CM | POA: Diagnosis not present

## 2022-04-08 DIAGNOSIS — F411 Generalized anxiety disorder: Secondary | ICD-10-CM

## 2022-04-08 DIAGNOSIS — F900 Attention-deficit hyperactivity disorder, predominantly inattentive type: Secondary | ICD-10-CM | POA: Diagnosis not present

## 2022-04-08 MED ORDER — LURASIDONE HCL 20 MG PO TABS: 20.0000 mg | ORAL_TABLET | Freq: Every day | ORAL | 0 refills | Status: DC

## 2022-04-08 MED ORDER — AMPHETAMINE-DEXTROAMPHETAMINE 15 MG PO TABS: 15.0000 mg | ORAL_TABLET | Freq: Every day | ORAL | 0 refills | Status: DC

## 2022-04-08 MED ORDER — AMPHETAMINE-DEXTROAMPHET ER 30 MG PO CP24: 30.0000 mg | ORAL_CAPSULE | Freq: Every day | ORAL | 0 refills | Status: DC

## 2022-04-08 MED ORDER — LURASIDONE HCL 20 MG PO TABS
20.0000 mg | ORAL_TABLET | Freq: Every day | ORAL | 0 refills | Status: DC
  Filled 2022-04-08 – 2022-04-12 (×2): qty 30, 30d supply, fill #0

## 2022-04-08 NOTE — Progress Notes (Signed)
Shawn Cervantes [id number] January 15, 1971 52 y.o.  Video Visit via My Chart  I connected with pt by video using My Chart and verified that I am speaking with the correct person using two identifiers.   I discussed the limitations, risks, security and privacy concerns of performing an evaluation and management service by My Chart  and the availability of in person appointments. I also discussed with the patient that there may be a patient responsible charge related to this service. The patient expressed understanding and agreed to proceed.  I discussed the assessment and treatment plan with the patient. The patient was provided an opportunity to ask questions and all were answered. The patient agreed with the plan and demonstrated an understanding of the instructions.   The patient was advised to call back or seek an in-person evaluation if the symptoms worsen or if the condition fails to improve as anticipated.  I provided 30 minutes of video time during this encounter.  The patient was located at home and the provider was located office. Session from 400-430 pm  Subjective:   [name] is a 52 y.o. (DOB [birth year]) male.  Chief Complaint:  Chief Complaint  Patient presents with   Follow-up    Bipolar disorder with severe depression ([facility])   Depression   Anxiety   ADHD   Sleeping Problem    HPI Shawn Cervantes presents to the office today for follow-up of bipolar depression and panic disorder and ADD.  On 12/24/21 started Trintellix and up to 10 mg daily.  And changed Adderall to concerta 54 mg Am No sig effect from Concerta 54. Maybe a little loss of energy with switch. NoSE with it. NO SE with trintellix change. No change in energy.   F in law died.   Maybe more depressed than he's gotten used to being.  Hard to make himself go out.  Can't fake his way through it right now. Anxiety settled after adjusted to Trintellix but still more.  Anxiety about the same as when  on Paxil.  Sleep pattern is same but if anything more sleep than normal.  But some days EFA.  Harder to wake in the morning.   Mirtazapine plus Quiviq helps initial insomnia.  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 Increase Trintellix to 20 mg daily for treatment resistant bipolar depression  03/18/22 appt noted: Sleep better off Xtampza and on morphine ER 15 BID. No benefit or SE with Trintellix 20 mg daily. Anxiety is ok but not great.  Not the thing that's keeping me from moving DT depression and fatigue. Chronic apathy so why bother doing anything.  Will get anxious thinking about the downward spiral. Concerta 72 mg no better than Adderall.  Doesn't take it daily bc doesn't seem to help.  Stopped it for a couple of weeks.  Did take it consistently at 54 mg .  Didn't notice much from the 72 mg being different. Thinks he originally got benefit from Adderall. Has decided the pain meds make him tired and apathetic and wants to try to get off pain meds.   Sleep with Ambien occ. Plan: Concerta without help so return to Adderall.   Increase Trintellix to 30 mg daily for treatment resistant bipolar depression for 2 weeks.  If NR stop it.  This is off label. Continue mirtazapine 30 mg HS.  OK prn zolpidem  04/08/22 appt noted: Overused the Ambien and out.     Took 30 mg Trintellix for a week or so and stopped it DT NR. Stopped pain pills for a couple of week and used Kratom to help with withdrawal.  Went to doctor yesterday and he got immediate release morphine 15 mg and got #120 pills. Withdrawal was better within a week or so.  Current meds:  Adderall XR 20 daily, flexeril 10 mg HS, Quiviviq, Depakote ER 1000 mg HS, lamotrigine 100 mg daily, lithium Cr 450 HS, mirtazapine 30 mg HS, stopped Trintellix, out of Ambien. Better overall mood since off the opiate.  Adderall seems more effective  off the opiate.  Would like to go back up on the amount to 30 XR and 15 IR daily. Beth says he's been up out of bed more than he was and applied for some jobs  so she notices some benefit. Last couple of weeks anxiety is pretty good.     Past Psychiatric Medication Trials: Depakote 1000 Oxcarbazepine hyponatremia Lamotrigine 100 mg twice daily Lithium 900 tremor Saphris 20 Seroquel 600 sedation Olanzapine Vraylar 3 mg no response Caplyta restlessness Latuda questionable dose, brief and possibly agitated Rexulti  Pramipexole no response and restless  Paroxetine 40 sleepy, fluoxetine, nortriptyline anxiety,  Wellbutrin no response, Auvelity no response Trintellix 30 for a week  Gabapentin 4000 mg no response Clonazepam 0.5 mg twice daily, alprazolam sedation Trazodone no response Ambien and Ambien CR, hydroxyzine, hydroxyzine, Lunesta, mirtazapine 30, Thorazine 25 side effects of night eating Lyrica Adderall, modafinil 774, Concerta 72 mg AM  Psychiatric hospitalization in [place] ED from 10/31/2021 in [facility] [place] No Risk        Review of Systems:  Review of Systems  Constitutional:  Positive for fatigue.  Musculoskeletal:  Positive for back pain.  Neurological:  Negative for weakness.  Psychiatric/Behavioral:  Positive for decreased concentration, dysphoric mood and sleep disturbance. The patient is nervous/anxious.     Medications: I have reviewed the patient's current medications.  Current Outpatient Medications  Medication Sig Dispense Refill   acetaminophen (TYLENOL) 325 MG tablet Take 650 mg by mouth 2 (two) times daily as needed for moderate pain.     cyclobenzaprine (FLEXERIL) 10 MG tablet Take 1 tablet (10 mg total) by mouth 3 (three) times daily as needed for muscle spasms. (Patient taking differently: Take 10 mg by mouth at bedtime.) 1 tablet 0   cyclobenzaprine (FLEXERIL) 10 MG tablet Take 1  tablet (10 mg total) by mouth at bedtime as needed for pain. 30 tablet 1   Daridorexant HCl (QUVIVIQ) 50 MG TABS Take 1 tablet by mouth at bedtime. 30 tablet 0   divalproex (DEPAKOTE ER) 500 MG 24 hr tablet Take 2 tablets (1,000 mg total) by mouth at bedtime. 180 tablet 1   ibuprofen (ADVIL,MOTRIN) 200 MG tablet Take 400 mg by mouth daily as needed for moderate pain.     lamoTRIgine (LAMICTAL) 100 MG tablet TAKE 1 TABLET BY MOUTH TWICE A DAY (Patient taking differently: Take 100 mg by mouth daily.) 180 tablet 0   lidocaine (LIDODERM) 5 % Place 1-3 patches onto the skin as directed. 270 patch 0   lithium carbonate (ESKALITH) 450 MG CR tablet Take 1 tablet (450 mg total) by mouth at bedtime. 90 tablet 1   mirtazapine (REMERON) 30 MG tablet Take 1 tablet (30 mg total) by mouth at bedtime. [place]  tablet 0   morphine (MS CONTIN) 15 MG 12 hr tablet Take 15 mg by mouth every 12 (twelve) hours.     morphine (MSIR) 15 MG tablet Take 1 tablet (15 mg total) by mouth every 6 (six) hours as needed for pain. 120 tablet 0   Omega-3 Fatty Acids (FISH OIL) 1000 MG CAPS Take 1,000 mg by mouth 2 (two) times daily.     Testosterone 10 MG/ACT (2%) GEL Apply 60 mg topically daily. 6 pumps = 10 mg per pump for hormonal replacement     Vitamin D, Ergocalciferol, (DRISDOL) 1.25 MG (50000 UNIT) CAPS capsule Take 1 capsule (50,000 Units total) by mouth every 7 (seven) days. 15 capsule 1   vitamin E 1000 UNIT capsule Take 1,000 Units by mouth at bedtime.     amphetamine-dextroamphetamine (ADDERALL XR) 30 MG 24 hr capsule Take 1 capsule (30 mg total) by mouth daily. 30 capsule 0   amphetamine-dextroamphetamine (ADDERALL) 15 MG tablet Take 1 tablet by mouth daily in the afternoon. 30 tablet 0   lurasidone (LATUDA) 20 MG TABS tablet Take 1 tablet (20 mg total) by mouth daily. 30 tablet 0   oxyCODONE ER (XTAMPZA ER) 13.5 MG C12A Take 1 capsule by mouth every evening. (Patient not taking: Reported on 04/08/2022)     vortioxetine HBr  (TRINTELLIX) 20 MG TABS tablet Take 1 tablet (20 mg total) by mouth daily. (Patient not taking: Reported on 04/08/2022) 30 tablet 1   No current facility-administered medications for this visit.    Medication Side Effects: None  Allergies:  Allergies  Allergen Reactions   Trileptal [Oxcarbazepine] Other (See Comments)    Low sodium    Past Medical History:  Diagnosis Date   ADHD (attention deficit hyperactivity disorder)    Anxiety    Arthritis    knee and back   Bipolar 1 disorder (HCC)    Complication of anesthesia    Constipation    Depression    bi polar   Head injury    as a child ;had stitches in head   Mental disorder    Bipolar   PONV (postoperative nausea and vomiting)    Testosterone deficiency    Trigeminal neuralgia of left side of face 04/01/2019   Left V2    Past Medical History, Surgical history, Social history, and Family history were reviewed and updated as appropriate.   Please see review of systems for further details on the patient's review from today.   Objective:   Physical Exam:  There were no vitals taken for this visit.  Physical Exam Neurological:     Mental Status: He is alert and oriented to person, place, and time.     Cranial Nerves: No dysarthria.  Psychiatric:        Attention and Perception: Attention and perception normal.        Mood and Affect: Mood is anxious and depressed. Affect is blunt.        Speech: Speech normal.        Behavior: Behavior is cooperative.        Thought Content: Thought content normal. Thought content is not paranoid or delusional. Thought content does not include homicidal or suicidal ideation. Thought content does not include suicidal plan.        Cognition and Memory: Cognition and memory normal.        Judgment: Judgment normal.     Comments: Insight intact     Lab Review:     Component  Value Date/Time   NA 139 05/25/2021 1501   K 4.7 05/25/2021 1501   CL 100 05/25/2021 1501   CO2 25  05/25/2021 1501   GLUCOSE 100 (H) 05/25/2021 1501   GLUCOSE 115 (H) 08/02/2016 1416   BUN 11 05/25/2021 1501   CREATININE 0.80 05/25/2021 1501   CALCIUM 9.3 05/25/2021 1501   PROT 6.9 09/22/2020 0927   ALBUMIN 4.5 09/22/2020 0927   AST 18 09/22/2020 0927   ALT 38 09/22/2020 0927   ALKPHOS 61 09/22/2020 0927   BILITOT <0.2 09/22/2020 0927   GFRNONAA >60 08/02/2016 1416   GFRAA >60 08/02/2016 1416       Component Value Date/Time   WBC 5.5 08/02/2016 1135   RBC 4.94 08/02/2016 1135   HGB 15.4 08/02/2016 1135   HCT 42.5 08/02/2016 1135   PLT 262 08/02/2016 1135   MCV 86.0 08/02/2016 1135   MCH 31.2 08/02/2016 1135   MCHC 36.2 (H) 08/02/2016 1135   RDW 12.3 08/02/2016 1135   LYMPHSABS 1.3 08/02/2016 1135   MONOABS 0.4 08/02/2016 1135   EOSABS 0.0 08/02/2016 1135   BASOSABS 0.0 08/02/2016 1135    Lithium Lvl  Date Value Ref Range Status  05/25/2021 0.5 0.5 - 1.2 mmol/L Final    Comment:    A concentration of 0.5-0.8 mmol/L is advised for long-term use; concentrations of up to 1.2 mmol/L may be necessary during acute treatment.                                  Detection Limit = 0.1                           <0.1 indicates None Detected      Lab Results  Component Value Date   VALPROATE 51 05/30/2019     .res Assessment: Plan:    Shawn Cervantes was seen today for follow-up, depression, anxiety, adhd and sleeping problem.  Diagnoses and all orders for this visit:  Bipolar disorder with severe depression (HCC) -     lurasidone (LATUDA) 20 MG TABS tablet; Take 1 tablet (20 mg total) by mouth daily.  Generalized anxiety disorder  Attention deficit hyperactivity disorder (ADHD), predominantly inattentive type -     amphetamine-dextroamphetamine (ADDERALL XR) 30 MG 24 hr capsule; Take 1 capsule (30 mg total) by mouth daily. -     amphetamine-dextroamphetamine (ADDERALL) 15 MG tablet; Take 1 tablet by mouth daily in the afternoon.  Insomnia due to mental condition     Greater than 50% of 30 min non face to face time with patient was spent on counseling and coordination of care. 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  Multiple medication failures. At this time generalized anxiety  managed. Insomnia is a chronic intermittent problem.  Discussed potential benefits, risks, and side effects of stimulants with patient to include increased heart rate, palpitations, insomnia, increased anxiety, increased irritability, or decreased appetite.  Instructed patient to contact office if experiencing any significant tolerability issues.  Cont other meds  Option clozapine, retry Latuda or Seroquel  Concerta without help so return to Adderall.    DC Trintellix  Agrees to [employer] but go really low to start bc got agitated on it before.  Continue mirtazapine 30 mg HS.    DC Ambien and no further RX for it.  Rec counseling.  Last was a little and was online in  2022.    Option ECT  Follow-up 1 month  Discussed side effects of each medication.  Meredith Staggers MD, DFAPA   Please see After Visit Summary for patient specific instructions.  No future appointments.  No orders of the defined types were placed in this encounter.   -------------------------------bi

## 2022-04-09 ENCOUNTER — Other Ambulatory Visit (HOSPITAL_COMMUNITY): Payer: Self-pay

## 2022-04-12 ENCOUNTER — Other Ambulatory Visit (HOSPITAL_COMMUNITY): Payer: Self-pay

## 2022-04-21 ENCOUNTER — Other Ambulatory Visit (HOSPITAL_COMMUNITY): Payer: Self-pay

## 2022-04-21 ENCOUNTER — Other Ambulatory Visit: Payer: Self-pay | Admitting: Psychiatry

## 2022-04-21 DIAGNOSIS — F5105 Insomnia due to other mental disorder: Secondary | ICD-10-CM

## 2022-04-22 ENCOUNTER — Other Ambulatory Visit: Payer: Self-pay

## 2022-04-22 ENCOUNTER — Other Ambulatory Visit (HOSPITAL_COMMUNITY): Payer: Self-pay

## 2022-04-22 DIAGNOSIS — F5105 Insomnia due to other mental disorder: Secondary | ICD-10-CM

## 2022-04-22 MED ORDER — QUVIVIQ 50 MG PO TABS
1.0000 | ORAL_TABLET | Freq: Every evening | ORAL | 2 refills | Status: DC
  Filled 2022-04-22: qty 30, 30d supply, fill #0

## 2022-04-22 MED ORDER — VITAMIN D (ERGOCALCIFEROL) 1.25 MG (50000 UNIT) PO CAPS
50000.0000 [IU] | ORAL_CAPSULE | ORAL | 1 refills | Status: DC
  Filled 2022-04-22: qty 12, 84d supply, fill #0
  Filled 2022-09-20: qty 12, 84d supply, fill #1
  Filled 2022-12-22: qty 6, 42d supply, fill #2

## 2022-04-25 ENCOUNTER — Other Ambulatory Visit (HOSPITAL_COMMUNITY): Payer: Self-pay

## 2022-04-26 ENCOUNTER — Other Ambulatory Visit (HOSPITAL_COMMUNITY): Payer: Self-pay

## 2022-05-10 ENCOUNTER — Other Ambulatory Visit: Payer: Self-pay | Admitting: Psychiatry

## 2022-05-11 ENCOUNTER — Other Ambulatory Visit (HOSPITAL_COMMUNITY): Payer: Self-pay

## 2022-05-13 ENCOUNTER — Telehealth: Payer: Commercial Managed Care - PPO | Admitting: Psychiatry

## 2022-05-16 ENCOUNTER — Telehealth: Payer: Commercial Managed Care - PPO | Admitting: Psychiatry

## 2022-05-18 ENCOUNTER — Other Ambulatory Visit (HOSPITAL_COMMUNITY): Payer: Self-pay

## 2022-05-18 ENCOUNTER — Encounter: Payer: Self-pay | Admitting: Psychiatry

## 2022-05-18 ENCOUNTER — Telehealth (INDEPENDENT_AMBULATORY_CARE_PROVIDER_SITE_OTHER): Payer: Commercial Managed Care - PPO | Admitting: Psychiatry

## 2022-05-18 DIAGNOSIS — F411 Generalized anxiety disorder: Secondary | ICD-10-CM

## 2022-05-18 DIAGNOSIS — F314 Bipolar disorder, current episode depressed, severe, without psychotic features: Secondary | ICD-10-CM | POA: Diagnosis not present

## 2022-05-18 DIAGNOSIS — F5105 Insomnia due to other mental disorder: Secondary | ICD-10-CM | POA: Diagnosis not present

## 2022-05-18 DIAGNOSIS — F900 Attention-deficit hyperactivity disorder, predominantly inattentive type: Secondary | ICD-10-CM

## 2022-05-18 MED ORDER — QUETIAPINE FUMARATE 50 MG PO TABS
50.0000 mg | ORAL_TABLET | Freq: Every day | ORAL | 0 refills | Status: DC
  Filled 2022-05-18: qty 30, 30d supply, fill #0

## 2022-05-18 MED ORDER — AMANTADINE HCL 100 MG PO CAPS
100.0000 mg | ORAL_CAPSULE | Freq: Two times a day (BID) | ORAL | 0 refills | Status: DC
  Filled 2022-05-18: qty 60, 30d supply, fill #0

## 2022-05-18 NOTE — Progress Notes (Signed)
Shawn Cervantes [id number] 05-10-1970 52 y.o.  Video Visit via My Chart  I connected with pt by video using My Chart and verified that I am speaking with the correct person using two identifiers.   I discussed the limitations, risks, security and privacy concerns of performing an evaluation and management service by My Chart  and the availability of in person appointments. I also discussed with the patient that there may be a patient responsible charge related to this service. The patient expressed understanding and agreed to proceed.  I discussed the assessment and treatment plan with the patient. The patient was provided an opportunity to ask questions and all were answered. The patient agreed with the plan and demonstrated an understanding of the instructions.   The patient was advised to call back or seek an in-person evaluation if the symptoms worsen or if the condition fails to improve as anticipated.  I provided 30 minutes of video time during this encounter.  The patient was located at home and the provider was located office. Session from 1030-1100  Subjective:   [name] is a 52 y.o. (DOB [birth year]) male.  Chief Complaint:  Chief Complaint  Patient presents with   Follow-up   Depression   ADD   Fatigue    HPI Shawn Cervantes presents to the office today for follow-up of bipolar depression and panic disorder and ADD.  On 12/24/21 started Trintellix and up to 10 mg daily.  And changed Adderall to concerta 54 mg Am No sig effect from Concerta 54. Maybe a little loss of energy with switch. NoSE with it. NO SE with trintellix change. No change in energy.   F in law died.   Maybe more depressed than he's gotten used to being.  Hard to make himself go out.  Can't fake his way through it right now. Anxiety settled after adjusted to Trintellix but still more.  Anxiety about the same as when on Paxil.  Sleep pattern is same but if anything more sleep than normal.   But some days EFA.  Harder to wake in the morning.   Mirtazapine plus Quiviq helps initial insomnia.  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 Increase Trintellix to 20 mg daily for treatment resistant bipolar depression  03/18/22 appt noted: Sleep better off Xtampza and on morphine ER 15 BID. No benefit or SE with Trintellix 20 mg daily. Anxiety is ok but not great.  Not the thing that's keeping me from moving DT depression and fatigue. Chronic apathy so why bother doing anything.  Will get anxious thinking about the downward spiral. Concerta 72 mg no better than Adderall.  Doesn't take it daily bc doesn't seem to help.  Stopped it for a couple of weeks.  Did take it consistently at 54 mg .  Didn't notice much from the 72 mg being different. Thinks he originally got benefit from Adderall. Has decided the pain meds make him tired and apathetic and wants to try to get off pain meds.   Sleep with Ambien occ. Plan: Concerta without help so return to Adderall.   Increase Trintellix to 30 mg daily for treatment resistant bipolar depression for 2 weeks.  If NR stop it.  This is off label. Continue mirtazapine 30 mg HS.   OK prn zolpidem  04/08/22 appt noted: Overused the Ambien and out.  Took 30 mg Trintellix for a week or so and stopped it DT NR. Stopped pain pills for a couple of week and used Kratom to help with withdrawal.  Went to doctor yesterday and he got immediate release morphine 15 mg and got #120 pills. Withdrawal was better within a week or so.  Current meds:  Adderall XR 20 daily, flexeril 10 mg HS, Quiviviq, Depakote ER 1000 mg HS, lamotrigine 100 mg daily, lithium Cr 450 HS, mirtazapine 30 mg HS, stopped Trintellix, out of Ambien. Better overall mood since off the opiate.  Adderall seems more effective off the opiate.  Would like to go back up on the amount to 30 XR and 15 IR  daily. Beth says he's been up out of bed more than he was and applied for some jobs  so she notices some benefit. Last couple of weeks anxiety is pretty good.   Plan: Concerta without help so return to Adderall.   DC Trintellix Agrees to [employer] but go really low to start bc got agitated on it before. Continue mirtazapine 30 mg HS.   DC Ambien and no further RX for it.  05/18/2022 appointment noted: About 2 to 3 weeks ago stopped Latuda 20 mg because he felt agitated. Wife reports she is not doing well with regard to depression. 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  W doesn't think his thinking realistic.  He's been in bed for a week off Latuda. Got scammed in job situation.  Haven't felt as bad as this in a long time.  Feels like a disappointment.    Past Psychiatric Medication Trials: Depakote 1000 Oxcarbazepine hyponatremia Lamotrigine 100 mg twice daily Lithium 900 tremor Saphris 20 sed Seroquel 600 sedation Olanzapine Vraylar 3 mg no response Caplyta restlessness Latuda questionable dose, brief and possibly agitated, 20 mg retry agitated & angry Rexulti  Pramipexole no response and restless  Paroxetine 40 sleepy, fluoxetine,  nortriptyline anxiety,  Wellbutrin no response,  Auvelity no response Trintellix 30 for a week, NR  Gabapentin 4000 mg no response Clonazepam 0.5 mg twice daily, alprazolam sedation Trazodone no response Ambien and Ambien CR abuse, hydroxyzine, hydroxyzine, Lunesta, mirtazapine 30, Thorazine 25 side effects of night eating, Quivivq Lyrica Adderall, modafinil [id number], Concerta 72 mg AM poor response  Psychiatric hospitalization in [facility] from 10/31/2021 in [facility] [place] [facility]        Review of Systems:  Review of Systems  Constitutional:  Positive for fatigue.  Musculoskeletal:  Positive for back pain.  Psychiatric/Behavioral:  Positive for decreased concentration,  dysphoric mood and sleep disturbance. The patient is nervous/anxious.     Medications: I have reviewed the patient's current medications.  Current Outpatient Medications  Medication Sig Dispense Refill   acetaminophen (TYLENOL) 325 MG tablet Take 650 mg by mouth 2 (two) times daily as needed for moderate pain.     amphetamine-dextroamphetamine (ADDERALL XR) 30 MG 24 hr capsule Take 1 capsule (30 mg total) by mouth daily. 30 capsule 0   amphetamine-dextroamphetamine (ADDERALL) 15 MG tablet Take 1 tablet by mouth daily in the afternoon. 30 tablet 0   cyclobenzaprine (FLEXERIL) 10 MG tablet Take 1 tablet (10 mg total) by mouth 3 (three) times daily as needed for muscle spasms. (Patient taking differently: Take 10 mg by mouth at bedtime.) 1 tablet 0   cyclobenzaprine (FLEXERIL) 10 MG tablet Take 1 tablet (10 mg total) by mouth at bedtime  as needed for pain. 30 tablet 1   Daridorexant HCl (QUVIVIQ) 50 MG TABS Take 1 tablet (50 mg total) by mouth at bedtime. 30 tablet 2   divalproex (DEPAKOTE ER) 500 MG 24 hr tablet Take 2 tablets (1,000 mg total) by mouth at bedtime. 180 tablet 1   ibuprofen (ADVIL,MOTRIN) 200 MG tablet Take 400 mg by mouth daily as needed for moderate pain.     lamoTRIgine (LAMICTAL) 100 MG tablet TAKE 1 TABLET BY MOUTH TWICE A DAY 180 tablet 0   lidocaine (LIDODERM) 5 % Place 1-3 patches onto the skin as directed. 270 patch 0   lithium carbonate (ESKALITH) 450 MG CR tablet Take 1 tablet (450 mg total) by mouth at bedtime. 90 tablet 1   mirtazapine (REMERON) 30 MG tablet Take 1 tablet (30 mg total) by mouth at bedtime. 90 tablet 0   morphine (MS CONTIN) 15 MG 12 hr tablet Take 15 mg by mouth every 12 (twelve) hours.     morphine (MSIR) 15 MG tablet Take 1 tablet (15 mg total) by mouth every 6 (six) hours as needed for pain. 120 tablet 0   Omega-3 Fatty Acids (FISH OIL) 1000 MG CAPS Take 1,000 mg by mouth 2 (two) times daily.     oxyCODONE ER (XTAMPZA ER) 13.5 MG C12A Take 1 capsule  by mouth every evening.     Testosterone 10 MG/ACT (2%) GEL Apply 60 mg topically daily. 6 pumps = 10 mg per pump for hormonal replacement     Vitamin D, Ergocalciferol, (DRISDOL) 1.25 MG (50000 UNIT) CAPS capsule Take 1 capsule (50,000 Units total) by mouth every 7 (seven) days. 15 capsule 1   vitamin E 1000 UNIT capsule Take 1,000 Units by mouth at bedtime.     No current facility-administered medications for this visit.    Medication Side Effects: None  Allergies:  Allergies  Allergen Reactions   Trileptal [Oxcarbazepine] Other (See Comments)    Low sodium    Past Medical History:  Diagnosis Date   ADHD (attention deficit hyperactivity disorder)    Anxiety    Arthritis    knee and back   Bipolar 1 disorder (HCC)    Complication of anesthesia    Constipation    Depression    bi polar   Head injury    as a child ;had stitches in head   Mental disorder    Bipolar   PONV (postoperative nausea and vomiting)    Testosterone deficiency    Trigeminal neuralgia of left side of face 04/01/2019   Left V2    Past Medical History, Surgical history, Social history, and Family history were reviewed and updated as appropriate.   Please see review of systems for further details on the patient's review from today.   Objective:   Physical Exam:  There were no vitals taken for this visit.  Physical Exam Neurological:     Mental Status: He is alert and oriented to person, place, and time.     Cranial Nerves: No dysarthria.  Psychiatric:        Attention and Perception: Attention and perception normal.        Mood and Affect: Mood is anxious and depressed. Affect is blunt.        Speech: Speech normal.        Behavior: Behavior is cooperative.        Thought Content: Thought content normal. Thought content is not paranoid or delusional. Thought content does not include homicidal  or suicidal ideation. Thought content does not include suicidal plan.        Cognition and Memory:  Cognition and memory normal.        Judgment: Judgment normal.     Comments: Insight intact    Lab Review:     Component Value Date/Time   NA 139 05/25/2021 1501   K 4.7 05/25/2021 1501   CL 100 05/25/2021 1501   CO2 25 05/25/2021 1501   GLUCOSE 100 (H) 05/25/2021 1501   GLUCOSE 115 (H) 08/02/2016 1416   BUN 11 05/25/2021 1501   CREATININE 0.80 05/25/2021 1501   CALCIUM 9.3 05/25/2021 1501   PROT 6.9 09/22/2020 0927   ALBUMIN 4.5 09/22/2020 0927   AST 18 09/22/2020 0927   ALT 38 09/22/2020 0927   ALKPHOS 61 09/22/2020 0927   BILITOT <0.2 09/22/2020 0927   GFRNONAA >60 08/02/2016 1416   GFRAA >60 08/02/2016 1416       Component Value Date/Time   WBC 5.5 08/02/2016 1135   RBC 4.94 08/02/2016 1135   HGB 15.4 08/02/2016 1135   HCT 42.5 08/02/2016 1135   PLT 262 08/02/2016 1135   MCV 86.0 08/02/2016 1135   MCH 31.2 08/02/2016 1135   MCHC 36.2 (H) 08/02/2016 1135   RDW 12.3 08/02/2016 1135   LYMPHSABS 1.3 08/02/2016 1135   MONOABS 0.4 08/02/2016 1135   EOSABS 0.0 08/02/2016 1135   BASOSABS 0.0 08/02/2016 1135    Lithium Lvl  Date Value Ref Range Status  05/25/2021 0.5 0.5 - 1.2 mmol/L Final    Comment:    A concentration of 0.5-0.8 mmol/L is advised for long-term use; concentrations of up to 1.2 mmol/L may be necessary during acute treatment.                                  Detection Limit = 0.1                           <0.1 indicates None Detected      Lab Results  Component Value Date   VALPROATE 51 05/30/2019     .res Assessment: Plan:    Harmon was seen today for follow-up, depression, add and fatigue.  Diagnoses and all orders for this visit:  Bipolar disorder with severe depression ([place])  Generalized anxiety disorder  Attention deficit hyperactivity disorder (ADHD), predominantly inattentive type  Insomnia due to mental condition    Greater than 50% of 30 min non face to face time with patient was spent on counseling and coordination of  care. 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  Multiple medication failures. At this time generalized anxiety  managed. Insomnia is a chronic intermittent problem.  Discussed potential benefits, risks, and side effects of stimulants with patient to include increased heart rate, palpitations, insomnia, increased anxiety, increased irritability, or decreased appetite.  Instructed patient to contact office if experiencing any significant tolerability issues.  Cont other meds  Option clozapine, or Seroquel, off label amantadine.  Option ECT.  Disc in detail.  Amantadine 100 mg HS for a week then [id number] mg BID  Concerta without help so return to Adderall.    DC  mirtazapine 30 mg HS Seroquel 25-50 mg HS    DC Ambien and no further RX for it.  Rec counseling.  Last was a little and was online in 2022.    Follow-up 1 month  Discussed side effects of each medication.  Lynder Parents MD, DFAPA   Please see After Visit Summary for patient specific instructions.  No future appointments.   No orders of the defined types were placed in this encounter.   -------------------------------bi

## 2022-05-23 ENCOUNTER — Other Ambulatory Visit (HOSPITAL_COMMUNITY): Payer: Self-pay

## 2022-05-23 DIAGNOSIS — G4700 Insomnia, unspecified: Secondary | ICD-10-CM | POA: Diagnosis not present

## 2022-05-23 DIAGNOSIS — M47816 Spondylosis without myelopathy or radiculopathy, lumbar region: Secondary | ICD-10-CM | POA: Diagnosis not present

## 2022-05-23 DIAGNOSIS — G894 Chronic pain syndrome: Secondary | ICD-10-CM | POA: Diagnosis not present

## 2022-05-23 DIAGNOSIS — M961 Postlaminectomy syndrome, not elsewhere classified: Secondary | ICD-10-CM | POA: Diagnosis not present

## 2022-05-23 MED ORDER — CYCLOBENZAPRINE HCL 10 MG PO TABS
10.0000 mg | ORAL_TABLET | Freq: Every day | ORAL | 2 refills | Status: DC
Start: 2022-05-23 — End: 2023-06-16
  Filled 2022-05-23 – 2022-09-20 (×3): qty 30, 30d supply, fill #0
  Filled 2022-10-21: qty 30, 30d supply, fill #1
  Filled 2022-11-28: qty 30, 30d supply, fill #2

## 2022-05-24 ENCOUNTER — Other Ambulatory Visit (HOSPITAL_COMMUNITY): Payer: Self-pay

## 2022-05-24 ENCOUNTER — Other Ambulatory Visit: Payer: Self-pay | Admitting: Psychiatry

## 2022-05-24 DIAGNOSIS — F900 Attention-deficit hyperactivity disorder, predominantly inattentive type: Secondary | ICD-10-CM

## 2022-05-24 MED ORDER — AMPHETAMINE-DEXTROAMPHETAMINE 15 MG PO TABS
15.0000 mg | ORAL_TABLET | Freq: Every day | ORAL | 0 refills | Status: DC
  Filled 2022-05-24 (×2): qty 30, 30d supply, fill #0

## 2022-05-24 MED ORDER — AMPHETAMINE-DEXTROAMPHET ER 30 MG PO CP24
30.0000 mg | ORAL_CAPSULE | Freq: Every day | ORAL | 0 refills | Status: DC
  Filled 2022-05-24 (×2): qty 30, 30d supply, fill #0

## 2022-06-16 ENCOUNTER — Other Ambulatory Visit (HOSPITAL_COMMUNITY): Payer: Self-pay

## 2022-06-16 ENCOUNTER — Other Ambulatory Visit: Payer: Self-pay

## 2022-06-16 ENCOUNTER — Other Ambulatory Visit: Payer: Self-pay | Admitting: Psychiatry

## 2022-06-16 DIAGNOSIS — F314 Bipolar disorder, current episode depressed, severe, without psychotic features: Secondary | ICD-10-CM

## 2022-06-16 DIAGNOSIS — F5105 Insomnia due to other mental disorder: Secondary | ICD-10-CM

## 2022-06-16 DIAGNOSIS — F900 Attention-deficit hyperactivity disorder, predominantly inattentive type: Secondary | ICD-10-CM

## 2022-06-16 MED ORDER — AMPHETAMINE-DEXTROAMPHETAMINE 15 MG PO TABS
15.0000 mg | ORAL_TABLET | Freq: Every day | ORAL | 0 refills | Status: DC
  Filled 2022-06-16: qty 30, 30d supply, fill #0

## 2022-06-16 MED ORDER — AMPHETAMINE-DEXTROAMPHET ER 30 MG PO CP24
30.0000 mg | ORAL_CAPSULE | Freq: Every day | ORAL | 0 refills | Status: DC
  Filled 2022-06-16: qty 30, 30d supply, fill #0

## 2022-06-16 MED ORDER — AMANTADINE HCL 100 MG PO CAPS
100.0000 mg | ORAL_CAPSULE | Freq: Two times a day (BID) | ORAL | 0 refills | Status: DC
  Filled 2022-06-16: qty 60, 30d supply, fill #0

## 2022-06-16 MED ORDER — MIRTAZAPINE 30 MG PO TABS
30.0000 mg | ORAL_TABLET | Freq: Every day | ORAL | 0 refills | Status: DC
  Filled 2022-06-16: qty 90, 90d supply, fill #0

## 2022-06-16 MED ORDER — DIVALPROEX SODIUM ER 500 MG PO TB24
1000.0000 mg | ORAL_TABLET | Freq: Every day | ORAL | 1 refills | Status: DC
  Filled 2022-06-16: qty 180, 90d supply, fill #0

## 2022-06-16 MED ORDER — QUETIAPINE FUMARATE 50 MG PO TABS
50.0000 mg | ORAL_TABLET | Freq: Every day | ORAL | 0 refills | Status: DC
  Filled 2022-06-16: qty 30, 30d supply, fill #0

## 2022-06-17 ENCOUNTER — Telehealth (INDEPENDENT_AMBULATORY_CARE_PROVIDER_SITE_OTHER): Payer: Commercial Managed Care - PPO | Admitting: Psychiatry

## 2022-06-17 ENCOUNTER — Encounter: Payer: Self-pay | Admitting: Psychiatry

## 2022-06-17 ENCOUNTER — Other Ambulatory Visit (HOSPITAL_COMMUNITY): Payer: Self-pay

## 2022-06-17 DIAGNOSIS — F900 Attention-deficit hyperactivity disorder, predominantly inattentive type: Secondary | ICD-10-CM | POA: Diagnosis not present

## 2022-06-17 DIAGNOSIS — F314 Bipolar disorder, current episode depressed, severe, without psychotic features: Secondary | ICD-10-CM | POA: Diagnosis not present

## 2022-06-17 DIAGNOSIS — F5105 Insomnia due to other mental disorder: Secondary | ICD-10-CM | POA: Diagnosis not present

## 2022-06-17 DIAGNOSIS — F411 Generalized anxiety disorder: Secondary | ICD-10-CM

## 2022-06-17 MED ORDER — AMPHETAMINE-DEXTROAMPHET ER 30 MG PO CP24
30.0000 mg | ORAL_CAPSULE | Freq: Every day | ORAL | 0 refills | Status: DC
  Filled 2022-06-17 – 2022-06-23 (×2): qty 30, 30d supply, fill #0

## 2022-06-17 MED ORDER — AMPHETAMINE-DEXTROAMPHETAMINE 15 MG PO TABS
15.0000 mg | ORAL_TABLET | Freq: Every day | ORAL | 0 refills | Status: DC
  Filled 2022-06-17 – 2022-06-23 (×2): qty 30, 30d supply, fill #0

## 2022-06-17 MED ORDER — DIVALPROEX SODIUM ER 500 MG PO TB24
1000.0000 mg | ORAL_TABLET | Freq: Every day | ORAL | 1 refills | Status: DC
  Filled 2022-06-17 – 2022-08-29 (×4): qty 180, 90d supply, fill #0

## 2022-06-17 NOTE — Progress Notes (Signed)
Shawn Cervantes [id number] 01/18/1971 52 y.o.  Video Visit via My Chart  I connected with pt by video using My Chart and verified that I am speaking with the correct person using two identifiers.   I discussed the limitations, risks, security and privacy concerns of performing an evaluation and management service by My Chart  and the availability of in person appointments. I also discussed with the patient that there may be a patient responsible charge related to this service. The patient expressed understanding and agreed to proceed.  I discussed the assessment and treatment plan with the patient. The patient was provided an opportunity to ask questions and all were answered. The patient agreed with the plan and demonstrated an understanding of the instructions.   The patient was advised to call back or seek an in-person evaluation if the symptoms worsen or if the condition fails to improve as anticipated.  I provided 30 minutes of video time during this encounter.  The patient was located at home and the provider was located office. Session from [id number]  Subjective:   [name] is a 52 y.o. (DOB 11/11/1970) male.  Chief Complaint:  Chief Complaint  Patient presents with   Follow-up   Depression   Anxiety   Sleeping Problem    HPI Shawn Cervantes presents to the office today for follow-up of bipolar depression and panic disorder and ADD.  On 12/24/21 started Trintellix and up to 10 mg daily.  And changed Adderall to concerta 54 mg Am No sig effect from Concerta 54. Maybe a little loss of energy with switch. NoSE with it. NO SE with trintellix change. No change in energy.   F in law died.   Maybe more depressed than he's gotten used to being.  Hard to make himself go out.  Can't fake his way through it right now. Anxiety settled after adjusted to Trintellix but still more.  Anxiety about the same as when on Paxil.  Sleep pattern is same but if anything more sleep  than normal.  But some days EFA.  Harder to wake in the morning.   Mirtazapine plus Quiviq helps initial insomnia.  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 Increase Trintellix to 20 mg daily for treatment resistant bipolar depression  03/18/22 appt noted: Sleep better off Xtampza and on morphine ER 15 BID. No benefit or SE with Trintellix 20 mg daily. Anxiety is ok but not great.  Not the thing that's keeping me from moving DT depression and fatigue. Chronic apathy so why bother doing anything.  Will get anxious thinking about the downward spiral. Concerta 72 mg no better than Adderall.  Doesn't take it daily bc doesn't seem to help.  Stopped it for a couple of weeks.  Did take it consistently at 54 mg .  Didn't notice much from the 72 mg being different. Thinks he originally got benefit from Adderall. Has decided the pain meds make him tired and apathetic and wants to try to get off pain meds.   Sleep with Ambien occ. Plan: Concerta without help so return to Adderall.   Increase Trintellix to 30 mg daily for treatment resistant bipolar depression for 2 weeks.  If NR stop it.  This is off label. Continue mirtazapine 30 mg HS.   OK prn zolpidem  04/08/22 appt noted: Overused the Ambien and out.  Took 30 mg Trintellix for a week or so and stopped it DT NR. Stopped pain pills for a couple of week and used Kratom to help with withdrawal.  Went to doctor yesterday and he got immediate release morphine 15 mg and got #120 pills. Withdrawal was better within a week or so.  Current meds:  Adderall XR 20 daily, flexeril 10 mg HS, Quiviviq, Depakote ER 1000 mg HS, lamotrigine 100 mg daily, lithium Cr 450 HS, mirtazapine 30 mg HS, stopped Trintellix, out of Ambien. Better overall mood since off the opiate.  Adderall seems more effective off the opiate.  Would like to go back up on the amount to 30  XR and 15 IR daily. Beth says he's been up out of bed more than he was and applied for some jobs  so she notices some benefit. Last couple of weeks anxiety is pretty good.   Plan: Concerta without help so return to Adderall.   DC Trintellix Agrees to [employer] but go really low to start bc got agitated on it before. Continue mirtazapine 30 mg HS.   DC Ambien and no further RX for it.  05/18/2022 appointment noted: About 2 to 3 weeks ago stopped Latuda 20 mg because he felt agitated. Wife reports she is not doing well with regard to depression. 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  W doesn't think his thinking realistic.  He's been in bed for a week off Latuda. Got scammed in job situation.  Haven't felt as bad as this in a long time.  Feels like a disappointment.  Plan Amantadine 100 mg HS for a week then 100 mg BID Concerta without help so return to Adderall.   DC  mirtazapine 30 mg HS Seroquel 25-50 mg HS   DC Ambien and no further RX for it.  06/17/22 appt noted: Taking amantadine twice daily.  Didn't really notice anything as far as benefit. Anxiety has been up more "on fire" inside and feels afraid like something bad is about to happen.  Worse 3 days last week and 3 days this week crippling at times and usually mid afternoon.   Still too sleepy and tired to do anything and this causes anxiety.  Energy better briefly but down again.   Sleep better with Seroquel at nnight and more benefit.  But trouble waking up More desire to be engaged with people off all opiates.    Past Psychiatric Medication Trials: Depakote 1000 Oxcarbazepine hyponatremia Lamotrigine 100 mg twice daily Lithium 900 tremor Saphris 20 sed Seroquel 600 sedation Olanzapine Vraylar 3 mg no response Caplyta restlessness Latuda questionable dose, brief and possibly agitated, 20 mg retry agitated & angry Rexulti  Pramipexole no response and restless Amantadine 100 BID NR  Paroxetine 40 sleepy,  fluoxetine,  nortriptyline anxiety,  Wellbutrin no response,  Auvelity no response Trintellix 30 for a week, NR  Gabapentin 4000 mg no response Clonazepam 0.5 mg twice daily, alprazolam sedation Trazodone no response Ambien and Ambien CR abuse,  hydroxyzine, Lunesta, mirtazapine 30, Thorazine 25 side effects of night eating, Quivivq Lyrica Adderall, modafinil 300, Concerta 72 mg AM poor response  Psychiatric hospitalization in 1997   Flowsheet Row ED from 10/31/2021 in [facility] RISK CATEGORY No Risk        Review of Systems:  Review of Systems  Constitutional:  Positive for fatigue.  Musculoskeletal:  Positive for back pain.  Psychiatric/Behavioral:  Positive for decreased concentration, dysphoric mood  and sleep disturbance. The patient is nervous/anxious.     Medications: I have reviewed the patient's current medications.  Current Outpatient Medications  Medication Sig Dispense Refill   acetaminophen (TYLENOL) 325 MG tablet Take 650 mg by mouth 2 (two) times daily as needed for moderate pain.     cyclobenzaprine (FLEXERIL) 10 MG tablet Take 1 tablet (10 mg total) by mouth 3 (three) times daily as needed for muscle spasms. (Patient taking differently: Take 10 mg by mouth at bedtime.) 1 tablet 0   cyclobenzaprine (FLEXERIL) 10 MG tablet Take 1 tablet (10 mg total) by mouth at bedtime. 30 tablet 2   ibuprofen (ADVIL,MOTRIN) 200 MG tablet Take 400 mg by mouth daily as needed for moderate pain.     lamoTRIgine (LAMICTAL) 100 MG tablet TAKE 1 TABLET BY MOUTH TWICE A DAY 180 tablet 0   lidocaine (LIDODERM) 5 % Place 1-3 patches onto the skin as directed. 270 patch 0   lithium carbonate (ESKALITH) 450 MG CR tablet Take 1 tablet (450 mg total) by mouth at bedtime. 90 tablet 1   Omega-3 Fatty Acids (FISH OIL) 1000 MG CAPS Take 1,000 mg by mouth 2 (two) times daily.     QUEtiapine (SEROQUEL) 50 MG tablet Take 1 tablet (50 mg total) by mouth at  bedtime. 30 tablet 0   Testosterone 10 MG/ACT (2%) GEL Apply 60 mg topically daily. 6 pumps = 10 mg per pump for hormonal replacement     Vitamin D, Ergocalciferol, (DRISDOL) 1.25 MG (50000 UNIT) CAPS capsule Take 1 capsule (50,000 Units total) by mouth every 7 (seven) days. 15 capsule 1   vitamin E 1000 UNIT capsule Take 1,000 Units by mouth at bedtime.     amphetamine-dextroamphetamine (ADDERALL XR) 30 MG 24 hr capsule Take 1 capsule (30 mg total) by mouth daily. 30 capsule 0   amphetamine-dextroamphetamine (ADDERALL) 15 MG tablet Take 1 tablet by mouth daily in the afternoon. 30 tablet 0   divalproex (DEPAKOTE ER) 500 MG 24 hr tablet Take 2 tablets (1,000 mg total) by mouth at bedtime. 180 tablet 1   morphine (MS CONTIN) 15 MG 12 hr tablet Take 15 mg by mouth every 12 (twelve) hours. (Patient not taking: Reported on 06/17/2022)     morphine (MSIR) 15 MG tablet Take 1 tablet (15 mg total) by mouth every 6 (six) hours as needed for pain. (Patient not taking: Reported on 06/17/2022) 120 tablet 0   oxyCODONE ER (XTAMPZA ER) 13.5 MG C12A Take 1 capsule by mouth every evening. (Patient not taking: Reported on 06/17/2022)     No current facility-administered medications for this visit.    Medication Side Effects: None  Allergies:  Allergies  Allergen Reactions   Trileptal [Oxcarbazepine] Other (See Comments)    Low sodium    Past Medical History:  Diagnosis Date   ADHD (attention deficit hyperactivity disorder)    Anxiety    Arthritis    knee and back   Bipolar 1 disorder    Complication of anesthesia    Constipation    Depression    bi polar   Head injury    as a child ;had stitches in head   Mental disorder    Bipolar   PONV (postoperative nausea and vomiting)    Testosterone deficiency    Trigeminal neuralgia of left side of face 04/01/2019   Left V2    Past Medical History, Surgical history, Social history, and Family history were reviewed and updated as appropriate.  Please  see review of systems for further details on the patient's review from today.   Objective:   Physical Exam:  There were no vitals taken for this visit.  Physical Exam Neurological:     Mental Status: He is alert and oriented to person, place, and time.     Cranial Nerves: No dysarthria.  Psychiatric:        Attention and Perception: Attention and perception normal.        Mood and Affect: Mood is anxious and depressed. Affect is blunt.        Speech: Speech normal.        Behavior: Behavior is cooperative.        Thought Content: Thought content normal. Thought content is not paranoid or delusional. Thought content does not include homicidal or suicidal ideation. Thought content does not include suicidal plan.        Cognition and Memory: Cognition and memory normal.        Judgment: Judgment normal.     Comments: Insight intact More expressive in voice .  Less blunted affect Told a joke for the first time     Lab Review:     Component Value Date/Time   NA 139 05/25/2021 1501   K 4.7 05/25/2021 1501   CL 100 05/25/2021 1501   CO2 25 05/25/2021 1501   GLUCOSE 100 (H) 05/25/2021 1501   GLUCOSE 115 (H) 08/02/2016 1416   BUN 11 05/25/2021 1501   CREATININE 0.80 05/25/2021 1501   CALCIUM 9.3 05/25/2021 1501   PROT 6.9 09/22/2020 0927   ALBUMIN 4.5 09/22/2020 0927   AST 18 09/22/2020 0927   ALT 38 09/22/2020 0927   ALKPHOS 61 09/22/2020 0927   BILITOT <0.2 09/22/2020 0927   GFRNONAA >60 08/02/2016 1416   GFRAA >60 08/02/2016 1416       Component Value Date/Time   WBC 5.5 08/02/2016 1135   RBC 4.94 08/02/2016 1135   HGB 15.4 08/02/2016 1135   HCT 42.5 08/02/2016 1135   PLT 262 08/02/2016 1135   MCV 86.0 08/02/2016 1135   MCH 31.2 08/02/2016 1135   MCHC 36.2 (H) 08/02/2016 1135   RDW 12.3 08/02/2016 1135   LYMPHSABS 1.3 08/02/2016 1135   MONOABS 0.4 08/02/2016 1135   EOSABS 0.0 08/02/2016 1135   BASOSABS 0.0 08/02/2016 1135    Lithium Lvl  Date Value Ref Range  Status  05/25/2021 0.5 0.5 - 1.2 mmol/L Final    Comment:    A concentration of 0.5-0.8 mmol/L is advised for long-term use; concentrations of up to 1.2 mmol/L may be necessary during acute treatment.                                  Detection Limit = 0.1                           <0.1 indicates None Detected      Lab Results  Component Value Date   VALPROATE 51 05/30/2019     .res Assessment: Plan:    Vannak was seen today for follow-up, depression, anxiety and sleeping problem.  Diagnoses and all orders for this visit:  Bipolar disorder with severe depression -     divalproex (DEPAKOTE ER) 500 MG 24 hr tablet; Take 2 tablets (1,000 mg total) by mouth at bedtime.  Attention deficit hyperactivity disorder (ADHD), predominantly inattentive type -  amphetamine-dextroamphetamine (ADDERALL) 15 MG tablet; Take 1 tablet by mouth daily in the afternoon. -     amphetamine-dextroamphetamine (ADDERALL XR) 30 MG 24 hr capsule; Take 1 capsule (30 mg total) by mouth daily.  Generalized anxiety disorder  Insomnia due to mental condition    Greater than 50% of 30 min non face to face time with patient was spent on counseling and coordination of care. 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  Multiple medication failures. At this time generalized anxiety  managed. Insomnia is a chronic intermittent problem.  Better on Seroquel.    Discussed potential benefits, risks, and side effects of stimulants with patient to include increased heart rate, palpitations, insomnia, increased anxiety, increased irritability, or decreased appetite.  Instructed patient to contact office if experiencing any significant tolerability issues.  Cont other meds  GET SLEEP STUDY  Option clozapine, or Seroquel, Option ECT.  Disc in detail.  Option selegiline DT excessive sleepiness and fatigue.  Retry pramipexole (highest was 1.5 mg daily) , retry low dose Vraylar DC Amantadine 100 Start  Vraylar 1.5 mg QOD   Concerta without help so return to Adderall.    Seroquel 25-50 mg HS    DC Ambien and no further RX for it.  Increase Depakote ER 1500 mg HS  Rec counseling.  Last was a little and was online in 2022.    Follow-up 1 month  Discussed side effects of each medication.  Meredith Staggersarey Cottle MD, DFAPA   Please see After Visit Summary for patient specific instructions.  No future appointments.   No orders of the defined types were placed in this encounter.   -------------------------------bi

## 2022-06-18 ENCOUNTER — Other Ambulatory Visit (HOSPITAL_COMMUNITY): Payer: Self-pay

## 2022-06-21 ENCOUNTER — Other Ambulatory Visit (HOSPITAL_COMMUNITY): Payer: Self-pay

## 2022-06-23 ENCOUNTER — Other Ambulatory Visit (HOSPITAL_COMMUNITY): Payer: Self-pay

## 2022-07-02 ENCOUNTER — Other Ambulatory Visit: Payer: Self-pay | Admitting: Psychiatry

## 2022-07-02 ENCOUNTER — Other Ambulatory Visit (HOSPITAL_COMMUNITY): Payer: Self-pay

## 2022-07-02 DIAGNOSIS — F314 Bipolar disorder, current episode depressed, severe, without psychotic features: Secondary | ICD-10-CM

## 2022-07-02 DIAGNOSIS — F5105 Insomnia due to other mental disorder: Secondary | ICD-10-CM

## 2022-07-03 MED ORDER — QUETIAPINE FUMARATE 50 MG PO TABS
50.0000 mg | ORAL_TABLET | Freq: Every day | ORAL | 0 refills | Status: DC
  Filled 2022-07-03 – 2022-08-17 (×2): qty 30, 30d supply, fill #0

## 2022-07-04 ENCOUNTER — Other Ambulatory Visit: Payer: Self-pay

## 2022-07-04 ENCOUNTER — Other Ambulatory Visit (HOSPITAL_BASED_OUTPATIENT_CLINIC_OR_DEPARTMENT_OTHER): Payer: Self-pay

## 2022-07-04 ENCOUNTER — Other Ambulatory Visit (HOSPITAL_COMMUNITY): Payer: Self-pay

## 2022-07-04 ENCOUNTER — Encounter (HOSPITAL_BASED_OUTPATIENT_CLINIC_OR_DEPARTMENT_OTHER): Payer: Self-pay

## 2022-07-08 ENCOUNTER — Other Ambulatory Visit: Payer: Self-pay

## 2022-07-09 ENCOUNTER — Other Ambulatory Visit (HOSPITAL_COMMUNITY): Payer: Self-pay

## 2022-07-13 DIAGNOSIS — E291 Testicular hypofunction: Secondary | ICD-10-CM | POA: Diagnosis not present

## 2022-07-20 ENCOUNTER — Other Ambulatory Visit (HOSPITAL_COMMUNITY): Payer: Self-pay

## 2022-07-20 ENCOUNTER — Other Ambulatory Visit: Payer: Self-pay | Admitting: Psychiatry

## 2022-07-20 DIAGNOSIS — F900 Attention-deficit hyperactivity disorder, predominantly inattentive type: Secondary | ICD-10-CM

## 2022-07-20 DIAGNOSIS — E291 Testicular hypofunction: Secondary | ICD-10-CM | POA: Diagnosis not present

## 2022-07-20 MED ORDER — AMPHETAMINE-DEXTROAMPHET ER 30 MG PO CP24
30.0000 mg | ORAL_CAPSULE | Freq: Every day | ORAL | 0 refills | Status: DC
Start: 2022-07-20 — End: 2022-08-15
  Filled 2022-07-20 – 2022-07-22 (×2): qty 30, 30d supply, fill #0

## 2022-07-20 MED ORDER — AMPHETAMINE-DEXTROAMPHETAMINE 15 MG PO TABS
15.0000 mg | ORAL_TABLET | Freq: Every day | ORAL | 0 refills | Status: DC
Start: 2022-07-20 — End: 2022-08-15
  Filled 2022-07-20 – 2022-07-22 (×2): qty 30, 30d supply, fill #0

## 2022-07-21 ENCOUNTER — Other Ambulatory Visit (HOSPITAL_COMMUNITY): Payer: Self-pay

## 2022-07-22 ENCOUNTER — Encounter: Payer: Self-pay | Admitting: Psychiatry

## 2022-07-22 ENCOUNTER — Other Ambulatory Visit (HOSPITAL_COMMUNITY): Payer: Self-pay

## 2022-07-22 ENCOUNTER — Telehealth (INDEPENDENT_AMBULATORY_CARE_PROVIDER_SITE_OTHER): Payer: Commercial Managed Care - PPO | Admitting: Psychiatry

## 2022-07-22 DIAGNOSIS — F314 Bipolar disorder, current episode depressed, severe, without psychotic features: Secondary | ICD-10-CM | POA: Diagnosis not present

## 2022-07-22 DIAGNOSIS — F900 Attention-deficit hyperactivity disorder, predominantly inattentive type: Secondary | ICD-10-CM | POA: Diagnosis not present

## 2022-07-22 DIAGNOSIS — F411 Generalized anxiety disorder: Secondary | ICD-10-CM | POA: Diagnosis not present

## 2022-07-22 DIAGNOSIS — F5105 Insomnia due to other mental disorder: Secondary | ICD-10-CM | POA: Diagnosis not present

## 2022-07-22 NOTE — Progress Notes (Signed)
Shawn Cervantes [id number] [birth year] 52 y.o.  Video Visit via My Chart  I connected with pt by video using My Chart and verified that I am speaking with the correct person using two identifiers.   I discussed the limitations, risks, security and privacy concerns of performing an evaluation and management service by My Chart  and the availability of in person appointments. I also discussed with the patient that there may be a patient responsible charge related to this service. The patient expressed understanding and agreed to proceed.  I discussed the assessment and treatment plan with the patient. The patient was provided an opportunity to ask questions and all were answered. The patient agreed with the plan and demonstrated an understanding of the instructions.   The patient was advised to call back or seek an in-person evaluation if the symptoms worsen or if the condition fails to improve as anticipated.  I provided 30 minutes of video time during this encounter.  The patient was located at home and the provider was located office. Session from [phone]  Subjective:   [name] is a 52 y.o. (DOB 1971-02-21) male.  Chief Complaint:  Chief Complaint  Patient presents with   Follow-up   Fatigue   Depression   Sleeping Problem   Anxiety    HPI Shawn Cervantes presents to the office today for follow-up of bipolar depression and panic disorder and ADD.  On 12/24/21 started Trintellix and up to 10 mg daily.  And changed Adderall to concerta 54 mg Am No sig effect from Concerta 54. Maybe a little loss of energy with switch. NoSE with it. NO SE with trintellix change. No change in energy.   F in law died.   Maybe more depressed than he's gotten used to being.  Hard to make himself go out.  Can't fake his way through it right now. Anxiety settled after adjusted to Trintellix but still more.  Anxiety about the same as when on Paxil.  Sleep pattern is same but if anything  more sleep than normal.  But some days EFA.  Harder to wake in the morning.   Mirtazapine plus Quiviq helps initial insomnia.  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 Increase Trintellix to 20 mg daily for treatment resistant bipolar depression  03/18/22 appt noted: Sleep better off Xtampza and on morphine ER 15 BID. No benefit or SE with Trintellix 20 mg daily. Anxiety is ok but not great.  Not the thing that's keeping me from moving DT depression and fatigue. Chronic apathy so why bother doing anything.  Will get anxious thinking about the downward spiral. Concerta 72 mg no better than Adderall.  Doesn't take it daily bc doesn't seem to help.  Stopped it for a couple of weeks.  Did take it consistently at 54 mg .  Didn't notice much from the 72 mg being different. Thinks he originally got benefit from Adderall. Has decided the pain meds make him tired and apathetic and wants to try to get off pain meds.   Sleep with Ambien occ. Plan: Concerta without help so return to Adderall.   Increase Trintellix to 30 mg daily for treatment resistant bipolar depression for 2 weeks.  If NR stop it.  This is off label. Continue mirtazapine 30 mg HS.   OK prn zolpidem  04/08/22 appt noted: Overused the  Ambien and out.     Took 30 mg Trintellix for a week or so and stopped it DT NR. Stopped pain pills for a couple of week and used Kratom to help with withdrawal.  Went to doctor yesterday and he got immediate release morphine 15 mg and got #120 pills. Withdrawal was better within a week or so.  Current meds:  Adderall XR 20 daily, flexeril 10 mg HS, Quiviviq, Depakote ER 1000 mg HS, lamotrigine 100 mg daily, lithium Cr 450 HS, mirtazapine 30 mg HS, stopped Trintellix, out of Ambien. Better overall mood since off the opiate.  Adderall seems more effective off the opiate.  Would like to go back up on the  amount to 30 XR and 15 IR daily. Beth says he's been up out of bed more than he was and applied for some jobs  so she notices some benefit. Last couple of weeks anxiety is pretty good.   Plan: Concerta without help so return to Adderall.   DC Trintellix Agrees to [employer] but go really low to start bc got agitated on it before. Continue mirtazapine 30 mg HS.   DC Ambien and no further RX for it.  05/18/2022 appointment noted: About 2 to 3 weeks ago stopped Latuda 20 mg because he felt agitated. Wife reports she is not doing well with regard to depression. 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  W doesn't think his thinking realistic.  He's been in bed for a week off Latuda. Got scammed in job situation.  Haven't felt as bad as this in a long time.  Feels like a disappointment.  Plan Amantadine 100 mg HS for a week then 100 mg BID Concerta without help so return to Adderall.   DC  mirtazapine 30 mg HS Seroquel 25-50 mg HS   DC Ambien and no further RX for it.  06/17/22 appt noted: Taking amantadine twice daily.  Didn't really notice anything as far as benefit. Anxiety has been up more "on fire" inside and feels afraid like something bad is about to happen.  Worse 3 days last week and 3 days this week crippling at times and usually mid afternoon.   Still too sleepy and tired to do anything and this causes anxiety.  Energy better briefly but down again.   Sleep better with Seroquel at nnight and more benefit.  But trouble waking up More desire to be engaged with people off all opiates.   Plan: DC Amantadine 100 Start Vraylar 1.5 mg QOD  Adderall.   Seroquel 25-50 mg HS   DC Ambien and no further RX for it. Increase Depakote ER 1500 mg HS  07/22/22 appt noted: No opiate for almost 2 mos.  Energy is better and once awake can stay awake. Pain is roughly the same but more consistent than when on opiates.  And can have spikes. Kratom helps without SE and occ delta 8 on separate occ and  it helps pain. No crazy buzz like THC. Meds: Vraylar 1.5 mg daily, Vit D 50 K week, Seroquel 50 mg HS, Depakote Er 1500 HS, lamotrigine 100 BID , lithium ER 450 HS, Adderall XR 30 AM and IR 10 mg PM Started testosterone which might help mood and energy too.   More consistent.   A little less dep and a little better energy.  Some hangover with Seroquel and hard to get up 7 AM.  Sleep much better with Seroquel.  Sometimes sleeps through night.  No other  SE.   No akathisia. Not much anxiety at all lately.   Dep 4/10.  Definitely best in a long time.     Past Psychiatric Medication Trials: Depakote 1000 Oxcarbazepine hyponatremia Lamotrigine 100 mg twice daily Lithium 900 tremor Saphris 20 sed Seroquel 600 sedation Olanzapine Vraylar 3 mg no response and some akathisia Caplyta restlessness Latuda questionable dose, brief and possibly agitated, 20 mg retry agitated & angry Rexulti  Pramipexole no response and restless Amantadine 100 BID NR  Paroxetine 40 sleepy, fluoxetine,  nortriptyline anxiety,  Wellbutrin no response,  Auvelity no response Trintellix 30 for a week, NR  Gabapentin 4000 mg no response Clonazepam 0.5 mg twice daily, alprazolam sedation Trazodone no response Ambien and Ambien CR abuse,  hydroxyzine, Lunesta, mirtazapine 30, Thorazine 25 side effects of night eating, Quivivq Lyrica Adderall, modafinil 300, Concerta 72 mg AM poor response  Psychiatric hospitalization in 1997   Flowsheet Row ED from 10/31/2021 in [facility] RISK CATEGORY No Risk        Review of Systems:  Review of Systems  Constitutional:  Positive for fatigue.  Musculoskeletal:  Positive for back pain.  Psychiatric/Behavioral:  Positive for decreased concentration, dysphoric mood and sleep disturbance. The patient is nervous/anxious.     Medications: I have reviewed the patient's current medications.  Current Outpatient Medications  Medication Sig  Dispense Refill   acetaminophen (TYLENOL) 325 MG tablet Take 650 mg by mouth 2 (two) times daily as needed for moderate pain.     amphetamine-dextroamphetamine (ADDERALL XR) 30 MG 24 hr capsule Take 1 capsule (30 mg total) by mouth daily. 30 capsule 0   amphetamine-dextroamphetamine (ADDERALL) 15 MG tablet Take 1 tablet by mouth daily in the afternoon. 30 tablet 0   cariprazine (VRAYLAR) 1.5 MG capsule Take 1.5 mg by mouth daily.     cyclobenzaprine (FLEXERIL) 10 MG tablet Take 1 tablet (10 mg total) by mouth 3 (three) times daily as needed for muscle spasms. (Patient taking differently: Take 10 mg by mouth at bedtime.) 1 tablet 0   cyclobenzaprine (FLEXERIL) 10 MG tablet Take 1 tablet (10 mg total) by mouth at bedtime. 30 tablet 2   divalproex (DEPAKOTE ER) 500 MG 24 hr tablet Take 2 tablets (1,000 mg total) by mouth at bedtime. (Patient taking differently: Take 1,500 mg by mouth at bedtime.) 180 tablet 1   ibuprofen (ADVIL,MOTRIN) 200 MG tablet Take 400 mg by mouth daily as needed for moderate pain.     lamoTRIgine (LAMICTAL) 100 MG tablet TAKE 1 TABLET BY MOUTH TWICE A DAY 180 tablet 0   lidocaine (LIDODERM) 5 % Place 1-3 patches onto the skin as directed. 270 patch 0   lithium carbonate (ESKALITH) 450 MG CR tablet Take 1 tablet (450 mg total) by mouth at bedtime. 90 tablet 1   morphine (MS CONTIN) 15 MG 12 hr tablet Take 15 mg by mouth every 12 (twelve) hours.     morphine (MSIR) 15 MG tablet Take 1 tablet (15 mg total) by mouth every 6 (six) hours as needed for pain. 120 tablet 0   Omega-3 Fatty Acids (FISH OIL) 1000 MG CAPS Take 1,000 mg by mouth 2 (two) times daily.     oxyCODONE ER (XTAMPZA ER) 13.5 MG C12A Take 1 capsule by mouth every evening.     QUEtiapine (SEROQUEL) 50 MG tablet Take 1 tablet (50 mg total) by mouth at bedtime. 30 tablet 0   Testosterone 10 MG/ACT (2%) GEL Apply 60  mg topically daily. 6 pumps = 10 mg per pump for hormonal replacement     Vitamin D, Ergocalciferol,  (DRISDOL) 1.25 MG (50000 UNIT) CAPS capsule Take 1 capsule (50,000 Units total) by mouth every 7 (seven) days. 15 capsule 1   vitamin E 1000 UNIT capsule Take 1,000 Units by mouth at bedtime.     No current facility-administered medications for this visit.    Medication Side Effects: None  Allergies:  Allergies  Allergen Reactions   Trileptal [Oxcarbazepine] Other (See Comments)    Low sodium    Past Medical History:  Diagnosis Date   ADHD (attention deficit hyperactivity disorder)    Anxiety    Arthritis    knee and back   Bipolar 1 disorder (HCC)    Complication of anesthesia    Constipation    Depression    bi polar   Head injury    as a child ;had stitches in head   Mental disorder    Bipolar   PONV (postoperative nausea and vomiting)    Testosterone deficiency    Trigeminal neuralgia of left side of face 04/01/2019   Left V2    Past Medical History, Surgical history, Social history, and Family history were reviewed and updated as appropriate.   Please see review of systems for further details on the patient's review from today.   Objective:   Physical Exam:  There were no vitals taken for this visit.  Physical Exam Neurological:     Mental Status: He is alert and oriented to person, place, and time.     Cranial Nerves: No dysarthria.  Psychiatric:        Attention and Perception: Attention and perception normal.        Mood and Affect: Mood is anxious and depressed. Affect is not blunt.        Speech: Speech normal.        Behavior: Behavior is cooperative.        Thought Content: Thought content normal. Thought content is not paranoid or delusional. Thought content does not include homicidal or suicidal ideation. Thought content does not include suicidal plan.        Cognition and Memory: Cognition and memory normal.        Judgment: Judgment normal.     Comments: Insight intact More expressive in voice .  Less blunted affect Better affect and  expressiveness.       Lab Review:     Component Value Date/Time   NA 139 05/25/2021 1501   K 4.7 05/25/2021 1501   CL 100 05/25/2021 1501   CO2 25 05/25/2021 1501   GLUCOSE 100 (H) 05/25/2021 1501   GLUCOSE 115 (H) 08/02/2016 1416   BUN 11 05/25/2021 1501   CREATININE 0.80 05/25/2021 1501   CALCIUM 9.3 05/25/2021 1501   PROT 6.9 09/22/2020 0927   ALBUMIN 4.5 09/22/2020 0927   AST 18 09/22/2020 0927   ALT 38 09/22/2020 0927   ALKPHOS 61 09/22/2020 0927   BILITOT <0.2 09/22/2020 0927   GFRNONAA >60 08/02/2016 1416   GFRAA >60 08/02/2016 1416       Component Value Date/Time   WBC 5.5 08/02/2016 1135   RBC 4.94 08/02/2016 1135   HGB 15.4 08/02/2016 1135   HCT 42.5 08/02/2016 1135   PLT 262 08/02/2016 1135   MCV 86.0 08/02/2016 1135   MCH 31.2 08/02/2016 1135   MCHC 36.2 (H) 08/02/2016 1135   RDW 12.3 08/02/2016 1135   LYMPHSABS 1.3  08/02/2016 1135   MONOABS 0.4 08/02/2016 1135   EOSABS 0.0 08/02/2016 1135   BASOSABS 0.0 08/02/2016 1135    Lithium Lvl  Date Value Ref Range Status  05/25/2021 0.5 0.5 - 1.2 mmol/L Final    Comment:    A concentration of 0.5-0.8 mmol/L is advised for long-term use; concentrations of up to 1.2 mmol/L may be necessary during acute treatment.                                  Detection Limit = 0.1                           <0.1 indicates None Detected      Lab Results  Component Value Date   VALPROATE 51 05/30/2019     .res Assessment: Plan:    Shawn Cervantes was seen today for follow-up, fatigue, depression, sleeping problem and anxiety.  Diagnoses and all orders for this visit:  Bipolar disorder with severe depression (HCC)  Generalized anxiety disorder  Attention deficit hyperactivity disorder (ADHD), predominantly inattentive type  Insomnia due to mental condition    Greater than 50% of 30 min non face to face time with patient was spent on counseling and coordination of care. We discussed his treatment resistant bipolar  depression.  Multiple medication failures. At this time generalized anxiety  managed. Depression about 50% better.   No opiates for almost 2 mos.  Better energy.   Insomnia is a chronic intermittent problem.  Better on Seroquel.    Discussed potential benefits, risks, and side effects of stimulants with patient to include increased heart rate, palpitations, insomnia, increased anxiety, increased irritability, or decreased appetite.  Instructed patient to contact office if experiencing any significant tolerability issues.  GET SLEEP STUDY  Option clozapine, or Seroquel, Option ECT.  Disc in detail.  Option selegiline DT excessive sleepiness and fatigue.  Retry pramipexole (highest was 1.5 mg daily)   No med changes: Continue Vraylar 1.5 mg QD  Adderall XR 30 AM and IR 15 mg PM   Seroquel 25-50 mg HS   DC Ambien and no further RX for it. Continue Depakote ER 1500 mg HS Continue lithium CR 450 Mg HS  Rec counseling.  Last was a little and was online in 2022.    Follow-up 1 month  Discussed side effects of each medication.  Meredith Staggers MD, DFAPA   Please see After Visit Summary for patient specific instructions.  No future appointments.   No orders of the defined types were placed in this encounter.   -------------------------------bi

## 2022-07-25 DIAGNOSIS — G4721 Circadian rhythm sleep disorder, delayed sleep phase type: Secondary | ICD-10-CM | POA: Diagnosis not present

## 2022-07-25 DIAGNOSIS — E669 Obesity, unspecified: Secondary | ICD-10-CM | POA: Diagnosis not present

## 2022-07-25 DIAGNOSIS — R030 Elevated blood-pressure reading, without diagnosis of hypertension: Secondary | ICD-10-CM | POA: Diagnosis not present

## 2022-07-25 DIAGNOSIS — F319 Bipolar disorder, unspecified: Secondary | ICD-10-CM | POA: Diagnosis not present

## 2022-07-25 DIAGNOSIS — G4733 Obstructive sleep apnea (adult) (pediatric): Secondary | ICD-10-CM | POA: Diagnosis not present

## 2022-08-15 ENCOUNTER — Other Ambulatory Visit: Payer: Self-pay | Admitting: Psychiatry

## 2022-08-15 ENCOUNTER — Other Ambulatory Visit (HOSPITAL_COMMUNITY): Payer: Self-pay

## 2022-08-15 DIAGNOSIS — F900 Attention-deficit hyperactivity disorder, predominantly inattentive type: Secondary | ICD-10-CM

## 2022-08-15 MED ORDER — AMPHETAMINE-DEXTROAMPHETAMINE 15 MG PO TABS
15.0000 mg | ORAL_TABLET | Freq: Every day | ORAL | 0 refills | Status: DC
Start: 2022-08-15 — End: 2022-09-20
  Filled 2022-08-15 – 2022-08-23 (×2): qty 30, 30d supply, fill #0

## 2022-08-15 MED ORDER — AMPHETAMINE-DEXTROAMPHET ER 30 MG PO CP24
30.0000 mg | ORAL_CAPSULE | Freq: Every day | ORAL | 0 refills | Status: DC
Start: 2022-08-15 — End: 2022-09-20
  Filled 2022-08-15 – 2022-08-23 (×2): qty 30, 30d supply, fill #0

## 2022-08-16 ENCOUNTER — Other Ambulatory Visit (HOSPITAL_COMMUNITY): Payer: Self-pay

## 2022-08-16 DIAGNOSIS — G4733 Obstructive sleep apnea (adult) (pediatric): Secondary | ICD-10-CM | POA: Diagnosis not present

## 2022-08-17 ENCOUNTER — Other Ambulatory Visit (HOSPITAL_COMMUNITY): Payer: Self-pay

## 2022-08-23 ENCOUNTER — Other Ambulatory Visit (HOSPITAL_COMMUNITY): Payer: Self-pay

## 2022-08-23 ENCOUNTER — Other Ambulatory Visit: Payer: Self-pay

## 2022-08-26 ENCOUNTER — Other Ambulatory Visit (HOSPITAL_COMMUNITY): Payer: Self-pay

## 2022-08-29 ENCOUNTER — Other Ambulatory Visit (HOSPITAL_COMMUNITY): Payer: Self-pay

## 2022-08-30 ENCOUNTER — Other Ambulatory Visit: Payer: Self-pay | Admitting: Psychiatry

## 2022-08-30 ENCOUNTER — Telehealth: Payer: Self-pay | Admitting: Psychiatry

## 2022-08-30 DIAGNOSIS — F411 Generalized anxiety disorder: Secondary | ICD-10-CM

## 2022-08-30 DIAGNOSIS — F314 Bipolar disorder, current episode depressed, severe, without psychotic features: Secondary | ICD-10-CM

## 2022-08-30 MED ORDER — DIVALPROEX SODIUM ER 500 MG PO TB24
2000.0000 mg | ORAL_TABLET | Freq: Every day | ORAL | 0 refills | Status: DC
Start: 2022-08-30 — End: 2022-12-22
  Filled 2022-08-30 – 2022-09-22 (×3): qty 360, 90d supply, fill #0

## 2022-08-30 MED ORDER — GABAPENTIN 300 MG PO CAPS
300.0000 mg | ORAL_CAPSULE | Freq: Three times a day (TID) | ORAL | 0 refills | Status: DC
Start: 2022-08-30 — End: 2022-09-20
  Filled 2022-08-30: qty 90, 30d supply, fill #0

## 2022-08-30 NOTE — Telephone Encounter (Signed)
RTC  New onset panic and anxiety.  Will go to sleep and then awake like something bad is happening and then calm down.  When awake is panicking.   Was out of Depakote for a few days and it got worse.  No other changes.    Took 600 mg gabapentin

## 2022-08-30 NOTE — Progress Notes (Signed)
RTC  Ran out of Depakote for 3 days and panic and ins resulted.  Took 600 mg gabapentin and it helped. Plan resume Depakote and be consistent. Ok gabapentin 300 mg TID prn anxiety but it takes 90 mins to work.  He's aware.  Meredith Staggers, MD, DFAPA

## 2022-08-31 ENCOUNTER — Other Ambulatory Visit (HOSPITAL_COMMUNITY): Payer: Self-pay

## 2022-08-31 ENCOUNTER — Other Ambulatory Visit: Payer: Self-pay

## 2022-09-12 ENCOUNTER — Other Ambulatory Visit: Payer: Self-pay | Admitting: Psychiatry

## 2022-09-12 DIAGNOSIS — F314 Bipolar disorder, current episode depressed, severe, without psychotic features: Secondary | ICD-10-CM

## 2022-09-15 DIAGNOSIS — G4733 Obstructive sleep apnea (adult) (pediatric): Secondary | ICD-10-CM | POA: Diagnosis not present

## 2022-09-16 DIAGNOSIS — E291 Testicular hypofunction: Secondary | ICD-10-CM | POA: Diagnosis not present

## 2022-09-20 ENCOUNTER — Other Ambulatory Visit: Payer: Self-pay | Admitting: Psychiatry

## 2022-09-20 DIAGNOSIS — F411 Generalized anxiety disorder: Secondary | ICD-10-CM

## 2022-09-20 DIAGNOSIS — F314 Bipolar disorder, current episode depressed, severe, without psychotic features: Secondary | ICD-10-CM

## 2022-09-20 DIAGNOSIS — F900 Attention-deficit hyperactivity disorder, predominantly inattentive type: Secondary | ICD-10-CM

## 2022-09-20 DIAGNOSIS — F5105 Insomnia due to other mental disorder: Secondary | ICD-10-CM

## 2022-09-21 ENCOUNTER — Other Ambulatory Visit (HOSPITAL_COMMUNITY): Payer: Self-pay

## 2022-09-21 ENCOUNTER — Other Ambulatory Visit: Payer: Self-pay

## 2022-09-21 MED ORDER — AMPHETAMINE-DEXTROAMPHETAMINE 15 MG PO TABS
15.0000 mg | ORAL_TABLET | Freq: Every day | ORAL | 0 refills | Status: DC
Start: 2022-09-21 — End: 2022-10-20
  Filled 2022-09-21: qty 30, 30d supply, fill #0

## 2022-09-21 MED ORDER — GABAPENTIN 300 MG PO CAPS
300.0000 mg | ORAL_CAPSULE | Freq: Three times a day (TID) | ORAL | 2 refills | Status: DC
Start: 2022-09-21 — End: 2023-01-18
  Filled 2022-09-21 – 2022-09-30 (×2): qty 90, 30d supply, fill #0
  Filled 2022-11-07: qty 90, 30d supply, fill #1
  Filled 2022-12-22: qty 90, 30d supply, fill #2

## 2022-09-21 MED ORDER — QUETIAPINE FUMARATE 50 MG PO TABS
50.0000 mg | ORAL_TABLET | Freq: Every day | ORAL | 0 refills | Status: DC
Start: 2022-09-21 — End: 2023-02-03
  Filled 2022-09-21: qty 90, 90d supply, fill #0

## 2022-09-21 MED ORDER — AMPHETAMINE-DEXTROAMPHET ER 30 MG PO CP24
30.0000 mg | ORAL_CAPSULE | Freq: Every day | ORAL | 0 refills | Status: DC
Start: 2022-09-21 — End: 2022-10-20
  Filled 2022-09-21: qty 30, 30d supply, fill #0

## 2022-09-22 ENCOUNTER — Other Ambulatory Visit (HOSPITAL_COMMUNITY): Payer: Self-pay

## 2022-09-23 ENCOUNTER — Other Ambulatory Visit (HOSPITAL_COMMUNITY): Payer: Self-pay

## 2022-09-23 ENCOUNTER — Other Ambulatory Visit: Payer: Self-pay

## 2022-09-23 DIAGNOSIS — E291 Testicular hypofunction: Secondary | ICD-10-CM | POA: Diagnosis not present

## 2022-09-26 ENCOUNTER — Other Ambulatory Visit: Payer: Self-pay

## 2022-09-26 ENCOUNTER — Other Ambulatory Visit (HOSPITAL_COMMUNITY): Payer: Self-pay

## 2022-09-29 DIAGNOSIS — Z1211 Encounter for screening for malignant neoplasm of colon: Secondary | ICD-10-CM | POA: Diagnosis not present

## 2022-09-29 DIAGNOSIS — D122 Benign neoplasm of ascending colon: Secondary | ICD-10-CM | POA: Diagnosis not present

## 2022-09-29 DIAGNOSIS — K5730 Diverticulosis of large intestine without perforation or abscess without bleeding: Secondary | ICD-10-CM | POA: Diagnosis not present

## 2022-09-29 DIAGNOSIS — D123 Benign neoplasm of transverse colon: Secondary | ICD-10-CM | POA: Diagnosis not present

## 2022-09-30 ENCOUNTER — Other Ambulatory Visit (HOSPITAL_COMMUNITY): Payer: Self-pay

## 2022-09-30 ENCOUNTER — Telehealth: Payer: Commercial Managed Care - PPO | Admitting: Psychiatry

## 2022-09-30 ENCOUNTER — Other Ambulatory Visit: Payer: Self-pay

## 2022-09-30 DIAGNOSIS — Z538 Procedure and treatment not carried out for other reasons: Secondary | ICD-10-CM

## 2022-10-03 ENCOUNTER — Other Ambulatory Visit (HOSPITAL_COMMUNITY): Payer: Self-pay

## 2022-10-03 ENCOUNTER — Other Ambulatory Visit: Payer: Self-pay | Admitting: Psychiatry

## 2022-10-03 DIAGNOSIS — F314 Bipolar disorder, current episode depressed, severe, without psychotic features: Secondary | ICD-10-CM

## 2022-10-03 MED ORDER — LITHIUM CARBONATE ER 450 MG PO TBCR
450.0000 mg | EXTENDED_RELEASE_TABLET | Freq: Every day | ORAL | 0 refills | Status: DC
Start: 2022-10-03 — End: 2022-12-27
  Filled 2022-10-03: qty 90, 90d supply, fill #0

## 2022-10-06 ENCOUNTER — Other Ambulatory Visit (HOSPITAL_COMMUNITY): Payer: Self-pay

## 2022-10-07 ENCOUNTER — Encounter: Payer: Self-pay | Admitting: Psychiatry

## 2022-10-07 ENCOUNTER — Telehealth (INDEPENDENT_AMBULATORY_CARE_PROVIDER_SITE_OTHER): Payer: Commercial Managed Care - PPO | Admitting: Psychiatry

## 2022-10-07 DIAGNOSIS — F5105 Insomnia due to other mental disorder: Secondary | ICD-10-CM

## 2022-10-07 DIAGNOSIS — F411 Generalized anxiety disorder: Secondary | ICD-10-CM

## 2022-10-07 DIAGNOSIS — G4733 Obstructive sleep apnea (adult) (pediatric): Secondary | ICD-10-CM | POA: Diagnosis not present

## 2022-10-07 DIAGNOSIS — F900 Attention-deficit hyperactivity disorder, predominantly inattentive type: Secondary | ICD-10-CM

## 2022-10-07 DIAGNOSIS — F314 Bipolar disorder, current episode depressed, severe, without psychotic features: Secondary | ICD-10-CM | POA: Diagnosis not present

## 2022-10-07 NOTE — Patient Instructions (Signed)
Sunosi samples 75mg  1/2 in the AM for 1 day then 1 tablet in the AM for 1 week and if needed increase to 150 mg AM

## 2022-10-07 NOTE — Progress Notes (Signed)
Shawn Cervantes [id number] Dec 07, 1970 52 y.o.  Video Visit via My Chart  I connected with pt by video using My Chart and verified that I am speaking with the correct person using two identifiers.   I discussed the limitations, risks, security and privacy concerns of performing an evaluation and management service by My Chart  and the availability of in person appointments. I also discussed with the patient that there may be a patient responsible charge related to this service. The patient expressed understanding and agreed to proceed.  I discussed the assessment and treatment plan with the patient. The patient was provided an opportunity to ask questions and all were answered. The patient agreed with the plan and demonstrated an understanding of the instructions.   The patient was advised to call back or seek an in-person evaluation if the symptoms worsen or if the condition fails to improve as anticipated.  I provided 30 minutes of video time during this encounter.  The patient was located at home and the provider was located office. Session from [id number]  Subjective:   [name] is a 52 y.o. (DOB 1970-05-13) male.  Chief Complaint:  Chief Complaint  Patient presents with   Follow-up   Depression   Anxiety   Fatigue    HPI Shawn Cervantes presents to the office today for follow-up of bipolar depression and panic disorder and ADD.  On 12/24/21 started Trintellix and up to 10 mg daily.  And changed Adderall to concerta 54 mg Am No sig effect from Concerta 54. Maybe a little loss of energy with switch. NoSE with it. NO SE with trintellix change. No change in energy.   F in law died.   Maybe more depressed than he's gotten used to being.  Hard to make himself go out.  Can't fake his way through it right now. Anxiety settled after adjusted to Trintellix but still more.  Anxiety about the same as when on Paxil.  Sleep pattern is same but if anything more sleep than  normal.  But some days EFA.  Harder to wake in the morning.   Mirtazapine plus Quiviq helps initial insomnia.  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 Increase Trintellix to 20 mg daily for treatment resistant bipolar depression  03/18/22 appt noted: Sleep better off Xtampza and on morphine ER 15 BID. No benefit or SE with Trintellix 20 mg daily. Anxiety is ok but not great.  Not the thing that's keeping me from moving DT depression and fatigue. Chronic apathy so why bother doing anything.  Will get anxious thinking about the downward spiral. Concerta 72 mg no better than Adderall.  Doesn't take it daily bc doesn't seem to help.  Stopped it for a couple of weeks.  Did take it consistently at 54 mg .  Didn't notice much from the 72 mg being different. Thinks he originally got benefit from Adderall. Has decided the pain meds make him tired and apathetic and wants to try to get off pain meds.   Sleep with Ambien occ. Plan: Concerta without help so return to Adderall.   Increase Trintellix to 30 mg daily for treatment resistant bipolar depression for 2 weeks.  If NR stop it.  This is off label. Continue mirtazapine 30 mg HS.   OK prn zolpidem  04/08/22 appt noted: Overused the Ambien and out.  Took 30 mg Trintellix for a week or so and stopped it DT NR. Stopped pain pills for a couple of week and used Kratom to help with withdrawal.  Went to doctor yesterday and he got immediate release morphine 15 mg and got #120 pills. Withdrawal was better within a week or so.  Current meds:  Adderall XR 20 daily, flexeril 10 mg HS, Quiviviq, Depakote ER 1000 mg HS, lamotrigine 100 mg daily, lithium Cr 450 HS, mirtazapine 30 mg HS, stopped Trintellix, out of Ambien. Better overall mood since off the opiate.  Adderall seems more effective off the opiate.  Would like to go back up on the amount to 30 XR  and 15 IR daily. Beth says he's been up out of bed more than he was and applied for some jobs  so she notices some benefit. Last couple of weeks anxiety is pretty good.   Plan: Concerta without help so return to Adderall.   DC Trintellix Agrees to [employer] but go really low to start bc got agitated on it before. Continue mirtazapine 30 mg HS.   DC Ambien and no further RX for it.  05/18/2022 appointment noted: About 2 to 3 weeks ago stopped Latuda 20 mg because he felt agitated. Wife reports she is not doing well with regard to depression. 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  W doesn't think his thinking realistic.  He's been in bed for a week off Latuda. Got scammed in job situation.  Haven't felt as bad as this in a long time.  Feels like a disappointment.  Plan Amantadine 100 mg HS for a week then 100 mg BID Concerta without help so return to Adderall.   DC  mirtazapine 30 mg HS Seroquel 25-50 mg HS   DC Ambien and no further RX for it.  06/17/22 appt noted: Taking amantadine twice daily.  Didn't really notice anything as far as benefit. Anxiety has been up more "on fire" inside and feels afraid like something bad is about to happen.  Worse 3 days last week and 3 days this week crippling at times and usually mid afternoon.   Still too sleepy and tired to do anything and this causes anxiety.  Energy better briefly but down again.   Sleep better with Seroquel at nnight and more benefit.  But trouble waking up More desire to be engaged with people off all opiates.   Plan: DC Amantadine 100 Start Vraylar 1.5 mg QOD  Adderall.   Seroquel 25-50 mg HS   DC Ambien and no further RX for it. Increase Depakote ER 1500 mg HS  07/22/22 appt noted: No opiate for almost 2 mos.  Energy is better and once awake can stay awake. Pain is roughly the same but more consistent than when on opiates.  And can have spikes. Kratom helps without SE and occ delta 8 on separate occ and it helps pain. No  crazy buzz like THC. Meds: Vraylar 1.5 mg daily, Vit D 50 K week, Seroquel 50 mg HS, Depakote Er 1500 HS, lamotrigine 100 BID , lithium ER 450 HS, Adderall XR 30 AM and IR 10 mg PM Started testosterone which might help mood and energy too.   More consistent.   A little less dep and a little better energy.  Some hangover with Seroquel and hard to get up 7 AM.  Sleep much better with Seroquel.  Sometimes sleeps through night.  No other SE.   No akathisia. Not much  anxiety at all lately.   Dep 4/10.  Definitely best in a long time.    10/07/22 appt noted: CPAP causes anxiety dealing with it using gabapentin. Sleep 5-6 hours with occ of more.   Meds as above  gabapentin as noted. For the most part ok.  A lot of trouble getting up in the AM chronically but worse the last couple of weeks.  Good bit of fatigue gradually better.   Adderall doesn't wake him up.   Managing to do a few things during the day now.  Takes 1 and 1/2 hour to go to sleep chronically.  To sleep about 1130.  Awakens 2-3 times per night and night eat and then gets up late about noon.  In bed 12 hours daily.  Will sleep solid 7-10 AM regularly.   Always hard to wake up but may be worse with Seroquel.   Been stressing some and a little low side with dep.  Still has some panic.  Situational usually.   SE mild akathisia  Past Psychiatric Medication Trials: Depakote 1000 Oxcarbazepine hyponatremia Lamotrigine 100 mg twice daily Lithium 900 tremor Saphris 20 sed Seroquel 600 sedation Olanzapine Vraylar 3 mg no response and some akathisia Caplyta restlessness Latuda questionable dose, brief and possibly agitated, 20 mg retry agitated & angry Rexulti  Pramipexole no response and restless Amantadine 100 BID NR  Paroxetine 40 sleepy, fluoxetine,  nortriptyline anxiety,  Wellbutrin no response,  Auvelity no response Trintellix 30 for a week, NR  Gabapentin 4000 mg no response Clonazepam 0.5 mg twice daily, alprazolam  sedation Trazodone no response Ambien and Ambien CR abuse,  hydroxyzine, Lunesta, mirtazapine 30,  Thorazine 25 side effects of night eating, Seroquel 50 for sleep Quiviq Lyrica Adderall, modafinil 300, Concerta 72 mg AM poor response  Psychiatric hospitalization in 1997  Flowsheet Row ED from 10/31/2021 in [facility] RISK CATEGORY No Risk        Review of Systems:  Review of Systems  Constitutional:  Positive for fatigue.  Musculoskeletal:  Positive for back pain.  Psychiatric/Behavioral:  Positive for decreased concentration, dysphoric mood and sleep disturbance. The patient is nervous/anxious.     Medications: I have reviewed the patient's current medications.  Current Outpatient Medications  Medication Sig Dispense Refill   acetaminophen (TYLENOL) 325 MG tablet Take 650 mg by mouth 2 (two) times daily as needed for moderate pain.     amphetamine-dextroamphetamine (ADDERALL XR) 30 MG 24 hr capsule Take 1 capsule (30 mg total) by mouth daily. 30 capsule 0   amphetamine-dextroamphetamine (ADDERALL) 15 MG tablet Take 1 tablet by mouth daily in the afternoon. 30 tablet 0   cariprazine (VRAYLAR) 1.5 MG capsule Take 1.5 mg by mouth daily.     cyclobenzaprine (FLEXERIL) 10 MG tablet Take 1 tablet (10 mg total) by mouth 3 (three) times daily as needed for muscle spasms. (Patient taking differently: Take 10 mg by mouth at bedtime.) 1 tablet 0   cyclobenzaprine (FLEXERIL) 10 MG tablet Take 1 tablet (10 mg total) by mouth at bedtime as needed for pain 30 tablet 2   divalproex (DEPAKOTE ER) 500 MG 24 hr tablet Take 4 tablets (2,000 mg total) by mouth at bedtime. (Patient taking differently: Take 1,500 mg by mouth at bedtime.) 360 tablet 0   gabapentin (NEURONTIN) 300 MG capsule Take 1 capsule (300 mg total) by mouth 3 (three) times daily. (Patient taking differently: Take 300 mg by mouth 3 (three) times daily. 2-3  capsules in evening for anxiety) 90 capsule  2   ibuprofen (ADVIL,MOTRIN) 200 MG tablet Take 400 mg by mouth daily as needed for moderate pain.     lamoTRIgine (LAMICTAL) 100 MG tablet TAKE 1 TABLET BY MOUTH TWICE A DAY 180 tablet 0   lidocaine (LIDODERM) 5 % Place 1-3 patches onto the skin as directed. 270 patch 0   lithium carbonate (ESKALITH) 450 MG ER tablet Take 1 tablet (450 mg total) by mouth at bedtime. 90 tablet 0   Omega-3 Fatty Acids (FISH OIL) 1000 MG CAPS Take 1,000 mg by mouth 2 (two) times daily.     QUEtiapine (SEROQUEL) 50 MG tablet Take 1 tablet (50 mg total) by mouth at bedtime. 90 tablet 0   Testosterone 10 MG/ACT (2%) GEL Apply 60 mg topically daily. 6 pumps = 10 mg per pump for hormonal replacement     Vitamin D, Ergocalciferol, (DRISDOL) 1.25 MG (50000 UNIT) CAPS capsule Take 1 capsule (50,000 Units total) by mouth every 7 (seven) days. 15 capsule 1   vitamin E 1000 UNIT capsule Take 1,000 Units by mouth at bedtime.     No current facility-administered medications for this visit.    Medication Side Effects: None  Allergies:  Allergies  Allergen Reactions   Trileptal [Oxcarbazepine] Other (See Comments)    Low sodium    Past Medical History:  Diagnosis Date   ADHD (attention deficit hyperactivity disorder)    Anxiety    Arthritis    knee and back   Bipolar 1 disorder (HCC)    Complication of anesthesia    Constipation    Depression    bi polar   Head injury    as a child ;had stitches in head   Mental disorder    Bipolar   PONV (postoperative nausea and vomiting)    Testosterone deficiency    Trigeminal neuralgia of left side of face 04/01/2019   Left V2    Past Medical History, Surgical history, Social history, and Family history were reviewed and updated as appropriate.   Please see review of systems for further details on the patient's review from today.   Objective:   Physical Exam:  There were no vitals taken for this visit.  Physical Exam Neurological:     Mental Status: He is  alert and oriented to person, place, and time.     Cranial Nerves: No dysarthria.  Psychiatric:        Attention and Perception: Attention and perception normal.        Mood and Affect: Mood is anxious and depressed. Affect is not blunt.        Speech: Speech normal.        Behavior: Behavior is cooperative.        Thought Content: Thought content normal. Thought content is not paranoid or delusional. Thought content does not include homicidal or suicidal ideation. Thought content does not include suicidal plan.        Cognition and Memory: Cognition and memory normal.        Judgment: Judgment normal.     Comments: Insight intact More expressive in voice .  Less blunted affect Better affect and expressiveness.   Still some anxiety situationally.       Lab Review:     Component Value Date/Time   NA 139 05/25/2021 1501   K 4.7 05/25/2021 1501   CL 100 05/25/2021 1501   CO2 25 05/25/2021 1501   GLUCOSE 100 (H) 05/25/2021 1501  GLUCOSE 115 (H) 08/02/2016 1416   BUN 11 05/25/2021 1501   CREATININE 0.80 05/25/2021 1501   CALCIUM 9.3 05/25/2021 1501   PROT 6.9 09/22/2020 0927   ALBUMIN 4.5 09/22/2020 0927   AST 18 09/22/2020 0927   ALT 38 09/22/2020 0927   ALKPHOS 61 09/22/2020 0927   BILITOT <0.2 09/22/2020 0927   GFRNONAA >60 08/02/2016 1416   GFRAA >60 08/02/2016 1416       Component Value Date/Time   WBC 5.5 08/02/2016 1135   RBC 4.94 08/02/2016 1135   HGB 15.4 08/02/2016 1135   HCT 42.5 08/02/2016 1135   PLT 262 08/02/2016 1135   MCV 86.0 08/02/2016 1135   MCH 31.2 08/02/2016 1135   MCHC 36.2 (H) 08/02/2016 1135   RDW 12.3 08/02/2016 1135   LYMPHSABS 1.3 08/02/2016 1135   MONOABS 0.4 08/02/2016 1135   EOSABS 0.0 08/02/2016 1135   BASOSABS 0.0 08/02/2016 1135    Lithium Lvl  Date Value Ref Range Status  05/25/2021 0.5 0.5 - 1.2 mmol/L Final    Comment:    A concentration of 0.5-0.8 mmol/L is advised for long-term use; concentrations of up to 1.2 mmol/L may  be necessary during acute treatment.                                  Detection Limit = 0.1                           <0.1 indicates None Detected      Lab Results  Component Value Date   VALPROATE 51 05/30/2019     .res Assessment: Plan:    Kyndall was seen today for follow-up, depression, anxiety and fatigue.  Diagnoses and all orders for this visit:  Bipolar disorder with severe depression (HCC)  Generalized anxiety disorder  Attention deficit hyperactivity disorder (ADHD), predominantly inattentive type  Insomnia due to mental condition  Obstructive sleep apnea hypopnea, severe    30 min non face to face time with patient was spent on counseling and coordination of care. 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  Multiple medication failures. At this time generalized anxiety  managed. Depression about 50% better.   No opiates for almost 2 mos.  Better energy.   Insomnia is a chronic intermittent problem.  Better on Seroquel.  But still a problem with sleep hygiene part of the problem.  Too much time in bed.  Disc sleep hygiene.    Discussed potential benefits, risks, and side effects of stimulants with patient to include increased heart rate, palpitations, insomnia, increased anxiety, increased irritability, or decreased appetite.  Instructed patient to contact office if experiencing any significant tolerability issues.  OSA severe on CPAP as of early June 2024 Consider Sunosi for fatigue and sleepiness to further help function.    Option clozapine, or Seroquel, Option ECT.  Disc in detail.  Option selegiline DT excessive sleepiness and fatigue.  Retry pramipexole (highest was 1.5 mg daily)   Continue Vraylar 1.5 mg QD  Cont Adderall XR 30 AM and IR 15 mg PM   Cont Seroquel 25-50 mg HS   Continue Depakote ER 1500 mg HS Continue lithium CR 450 Mg HS DC Ambien and no further RX for it.  For focus and alertness Sunosi trial 37.5 mg AM and gradually  increase to 150 mg AM  Rec counseling.  Last was a little  and was online in 2022.  Disc this again.   Counseling 18 min: Disc stressors and the typical recovery issues associated with chronic dep including effects on family and his self confidence.  Disc systems theory in relations to this.  Follow-up 1 month  Discussed side effects of each medication.  Meredith Staggers MD, DFAPA   Please see After Visit Summary for patient specific instructions.  No future appointments.   No orders of the defined types were placed in this encounter.   -------------------------------bi

## 2022-10-09 NOTE — Progress Notes (Signed)
RS.  Not seen

## 2022-10-12 ENCOUNTER — Encounter: Payer: Self-pay | Admitting: Psychiatry

## 2022-10-16 DIAGNOSIS — G4733 Obstructive sleep apnea (adult) (pediatric): Secondary | ICD-10-CM | POA: Diagnosis not present

## 2022-10-20 ENCOUNTER — Other Ambulatory Visit: Payer: Self-pay | Admitting: Psychiatry

## 2022-10-20 DIAGNOSIS — F900 Attention-deficit hyperactivity disorder, predominantly inattentive type: Secondary | ICD-10-CM

## 2022-10-20 NOTE — Telephone Encounter (Signed)
Due 8/9

## 2022-10-21 ENCOUNTER — Other Ambulatory Visit (HOSPITAL_COMMUNITY): Payer: Self-pay

## 2022-10-21 MED ORDER — AMPHETAMINE-DEXTROAMPHET ER 30 MG PO CP24
30.0000 mg | ORAL_CAPSULE | Freq: Every day | ORAL | 0 refills | Status: DC
Start: 2022-10-21 — End: 2022-11-21
  Filled 2022-10-21 – 2022-10-24 (×2): qty 30, 30d supply, fill #0

## 2022-10-21 MED ORDER — AMPHETAMINE-DEXTROAMPHETAMINE 15 MG PO TABS
15.0000 mg | ORAL_TABLET | Freq: Every day | ORAL | 0 refills | Status: DC
Start: 2022-10-21 — End: 2022-11-21
  Filled 2022-10-21 – 2022-10-24 (×2): qty 30, 30d supply, fill #0

## 2022-10-24 ENCOUNTER — Other Ambulatory Visit (HOSPITAL_COMMUNITY): Payer: Self-pay

## 2022-11-07 ENCOUNTER — Other Ambulatory Visit (HOSPITAL_COMMUNITY): Payer: Self-pay

## 2022-11-08 ENCOUNTER — Other Ambulatory Visit (HOSPITAL_COMMUNITY): Payer: Self-pay

## 2022-11-08 MED ORDER — TESTOSTERONE 10 MG/ACT (2%) TD GEL
TRANSDERMAL | 5 refills | Status: DC
  Filled 2022-11-08: qty 150, 30d supply, fill #0
  Filled 2022-11-10: qty 180, 90d supply, fill #0

## 2022-11-10 ENCOUNTER — Other Ambulatory Visit: Payer: Self-pay

## 2022-11-10 ENCOUNTER — Other Ambulatory Visit (HOSPITAL_COMMUNITY): Payer: Self-pay

## 2022-11-10 DIAGNOSIS — K219 Gastro-esophageal reflux disease without esophagitis: Secondary | ICD-10-CM | POA: Diagnosis not present

## 2022-11-10 DIAGNOSIS — Z0000 Encounter for general adult medical examination without abnormal findings: Secondary | ICD-10-CM | POA: Diagnosis not present

## 2022-11-10 DIAGNOSIS — Z125 Encounter for screening for malignant neoplasm of prostate: Secondary | ICD-10-CM | POA: Diagnosis not present

## 2022-11-10 DIAGNOSIS — Z1211 Encounter for screening for malignant neoplasm of colon: Secondary | ICD-10-CM | POA: Diagnosis not present

## 2022-11-10 DIAGNOSIS — E782 Mixed hyperlipidemia: Secondary | ICD-10-CM | POA: Diagnosis not present

## 2022-11-10 DIAGNOSIS — F319 Bipolar disorder, unspecified: Secondary | ICD-10-CM | POA: Diagnosis not present

## 2022-11-10 DIAGNOSIS — E291 Testicular hypofunction: Secondary | ICD-10-CM | POA: Diagnosis not present

## 2022-11-10 DIAGNOSIS — G4721 Circadian rhythm sleep disorder, delayed sleep phase type: Secondary | ICD-10-CM | POA: Diagnosis not present

## 2022-11-10 DIAGNOSIS — M545 Low back pain, unspecified: Secondary | ICD-10-CM | POA: Diagnosis not present

## 2022-11-10 DIAGNOSIS — F988 Other specified behavioral and emotional disorders with onset usually occurring in childhood and adolescence: Secondary | ICD-10-CM | POA: Diagnosis not present

## 2022-11-10 DIAGNOSIS — G43909 Migraine, unspecified, not intractable, without status migrainosus: Secondary | ICD-10-CM | POA: Diagnosis not present

## 2022-11-11 ENCOUNTER — Other Ambulatory Visit (HOSPITAL_COMMUNITY): Payer: Self-pay

## 2022-11-16 DIAGNOSIS — G4733 Obstructive sleep apnea (adult) (pediatric): Secondary | ICD-10-CM | POA: Diagnosis not present

## 2022-11-17 ENCOUNTER — Other Ambulatory Visit (HOSPITAL_COMMUNITY): Payer: Self-pay

## 2022-11-17 DIAGNOSIS — R030 Elevated blood-pressure reading, without diagnosis of hypertension: Secondary | ICD-10-CM | POA: Diagnosis not present

## 2022-11-17 DIAGNOSIS — N528 Other male erectile dysfunction: Secondary | ICD-10-CM | POA: Diagnosis not present

## 2022-11-17 DIAGNOSIS — G4733 Obstructive sleep apnea (adult) (pediatric): Secondary | ICD-10-CM | POA: Diagnosis not present

## 2022-11-17 DIAGNOSIS — E291 Testicular hypofunction: Secondary | ICD-10-CM | POA: Diagnosis not present

## 2022-11-17 DIAGNOSIS — F319 Bipolar disorder, unspecified: Secondary | ICD-10-CM | POA: Diagnosis not present

## 2022-11-17 MED ORDER — TESTOSTERONE 12.5 MG/ACT (1%) TD GEL
3.0000 | Freq: Every day | TRANSDERMAL | 5 refills | Status: DC
  Filled 2022-11-17: qty 150, 40d supply, fill #0
  Filled 2022-11-28: qty 150, 20d supply, fill #0
  Filled 2022-12-22: qty 150, 25d supply, fill #0
  Filled 2022-12-23: qty 150, fill #0
  Filled 2022-12-27: qty 150, 30d supply, fill #0
  Filled 2023-01-12: qty 150, 15d supply, fill #0
  Filled 2023-04-25: qty 150, 20d supply, fill #0

## 2022-11-17 MED ORDER — TESTOSTERONE CYPIONATE 200 MG/ML IM SOLN
100.0000 mg | INTRAMUSCULAR | 5 refills | Status: DC
  Filled 2022-11-17: qty 10, 70d supply, fill #0
  Filled 2023-02-17: qty 10, 70d supply, fill #1

## 2022-11-17 MED ORDER — "BD LUER-LOK SYRINGE 21G X 1-1/2"" 3 ML MISC"
5 refills | Status: DC
  Filled 2023-02-17: qty 15, 30d supply, fill #0

## 2022-11-18 ENCOUNTER — Other Ambulatory Visit: Payer: Self-pay | Admitting: Medical Genetics

## 2022-11-18 DIAGNOSIS — Z006 Encounter for examination for normal comparison and control in clinical research program: Secondary | ICD-10-CM

## 2022-11-21 ENCOUNTER — Other Ambulatory Visit: Payer: Self-pay | Admitting: Psychiatry

## 2022-11-21 ENCOUNTER — Other Ambulatory Visit (HOSPITAL_COMMUNITY): Payer: Self-pay

## 2022-11-21 DIAGNOSIS — F900 Attention-deficit hyperactivity disorder, predominantly inattentive type: Secondary | ICD-10-CM

## 2022-11-21 DIAGNOSIS — G4733 Obstructive sleep apnea (adult) (pediatric): Secondary | ICD-10-CM

## 2022-11-21 MED ORDER — SUNOSI 150 MG PO TABS
150.0000 mg | ORAL_TABLET | Freq: Every morning | ORAL | 1 refills | Status: DC
Start: 2022-11-21 — End: 2023-01-18
  Filled 2022-11-21 – 2022-11-28 (×3): qty 30, 30d supply, fill #0
  Filled 2022-12-22: qty 30, 30d supply, fill #1

## 2022-11-21 MED ORDER — LAMOTRIGINE 100 MG PO TABS
100.0000 mg | ORAL_TABLET | Freq: Two times a day (BID) | ORAL | 0 refills | Status: DC
  Filled 2022-11-21: qty 180, 90d supply, fill #0

## 2022-11-21 MED ORDER — AMPHETAMINE-DEXTROAMPHET ER 30 MG PO CP24
30.0000 mg | ORAL_CAPSULE | Freq: Every day | ORAL | 0 refills | Status: DC
Start: 2022-11-21 — End: 2022-12-19
  Filled 2022-11-21: qty 30, 30d supply, fill #0

## 2022-11-21 MED ORDER — AMPHETAMINE-DEXTROAMPHETAMINE 15 MG PO TABS
15.0000 mg | ORAL_TABLET | Freq: Every day | ORAL | 0 refills | Status: DC
Start: 2022-11-21 — End: 2022-12-19
  Filled 2022-11-21: qty 30, 30d supply, fill #0

## 2022-11-22 ENCOUNTER — Other Ambulatory Visit (HOSPITAL_COMMUNITY): Payer: Self-pay

## 2022-11-22 DIAGNOSIS — S6991XA Unspecified injury of right wrist, hand and finger(s), initial encounter: Secondary | ICD-10-CM | POA: Diagnosis not present

## 2022-11-22 DIAGNOSIS — M25531 Pain in right wrist: Secondary | ICD-10-CM | POA: Diagnosis not present

## 2022-11-22 MED ORDER — PREDNISONE 20 MG PO TABS
20.0000 mg | ORAL_TABLET | Freq: Two times a day (BID) | ORAL | 0 refills | Status: DC
  Filled 2022-11-22: qty 10, 5d supply, fill #0

## 2022-11-23 ENCOUNTER — Other Ambulatory Visit (HOSPITAL_COMMUNITY): Payer: Self-pay

## 2022-11-24 ENCOUNTER — Other Ambulatory Visit (HOSPITAL_COMMUNITY): Payer: Self-pay

## 2022-11-25 ENCOUNTER — Other Ambulatory Visit (HOSPITAL_COMMUNITY): Payer: Self-pay

## 2022-11-25 ENCOUNTER — Telehealth: Payer: Self-pay | Admitting: Psychiatry

## 2022-11-25 NOTE — Telephone Encounter (Signed)
Pharmacy sent PA request for SUNOSI 150mg . See CMM

## 2022-11-25 NOTE — Telephone Encounter (Signed)
Prior Approval received for Sunosi 150 mg effective 11/25/2022-05/25/2023, approved for 1 daily.

## 2022-11-25 NOTE — Telephone Encounter (Signed)
Prior Authorization submitted with [employer], pending response.

## 2022-11-28 ENCOUNTER — Other Ambulatory Visit (HOSPITAL_COMMUNITY): Payer: Self-pay

## 2022-11-28 DIAGNOSIS — G4733 Obstructive sleep apnea (adult) (pediatric): Secondary | ICD-10-CM | POA: Diagnosis not present

## 2022-11-29 ENCOUNTER — Other Ambulatory Visit: Payer: Self-pay

## 2022-12-16 DIAGNOSIS — G4733 Obstructive sleep apnea (adult) (pediatric): Secondary | ICD-10-CM | POA: Diagnosis not present

## 2022-12-19 ENCOUNTER — Other Ambulatory Visit: Payer: Self-pay | Admitting: Psychiatry

## 2022-12-19 DIAGNOSIS — F900 Attention-deficit hyperactivity disorder, predominantly inattentive type: Secondary | ICD-10-CM

## 2022-12-19 MED ORDER — AMPHETAMINE-DEXTROAMPHETAMINE 15 MG PO TABS
15.0000 mg | ORAL_TABLET | Freq: Every day | ORAL | 0 refills | Status: DC
Start: 2022-12-19 — End: 2023-01-18
  Filled 2022-12-19: qty 30, 30d supply, fill #0

## 2022-12-19 MED ORDER — AMPHETAMINE-DEXTROAMPHET ER 30 MG PO CP24
30.0000 mg | ORAL_CAPSULE | Freq: Every day | ORAL | 0 refills | Status: DC
Start: 2022-12-19 — End: 2023-01-18
  Filled 2022-12-19: qty 30, 30d supply, fill #0

## 2022-12-20 ENCOUNTER — Other Ambulatory Visit (HOSPITAL_COMMUNITY): Payer: Self-pay

## 2022-12-22 ENCOUNTER — Other Ambulatory Visit: Payer: Self-pay | Admitting: Psychiatry

## 2022-12-22 ENCOUNTER — Other Ambulatory Visit (HOSPITAL_COMMUNITY): Payer: Self-pay

## 2022-12-22 ENCOUNTER — Other Ambulatory Visit: Payer: Self-pay

## 2022-12-22 DIAGNOSIS — F314 Bipolar disorder, current episode depressed, severe, without psychotic features: Secondary | ICD-10-CM

## 2022-12-22 MED ORDER — DIVALPROEX SODIUM ER 500 MG PO TB24
2000.0000 mg | ORAL_TABLET | Freq: Every day | ORAL | 0 refills | Status: DC
Start: 2022-12-22 — End: 2023-05-02
  Filled 2022-12-22 – 2022-12-27 (×2): qty 360, 90d supply, fill #0
  Filled 2023-01-18 (×2): qty 180, 45d supply, fill #0

## 2022-12-23 ENCOUNTER — Ambulatory Visit: Payer: Commercial Managed Care - PPO | Admitting: Psychiatry

## 2022-12-23 ENCOUNTER — Other Ambulatory Visit (HOSPITAL_COMMUNITY): Payer: Self-pay

## 2022-12-26 DIAGNOSIS — E291 Testicular hypofunction: Secondary | ICD-10-CM | POA: Diagnosis not present

## 2022-12-27 ENCOUNTER — Other Ambulatory Visit: Payer: Self-pay | Admitting: Psychiatry

## 2022-12-27 ENCOUNTER — Other Ambulatory Visit: Payer: Self-pay

## 2022-12-27 ENCOUNTER — Other Ambulatory Visit (HOSPITAL_COMMUNITY): Payer: Self-pay

## 2022-12-27 DIAGNOSIS — F314 Bipolar disorder, current episode depressed, severe, without psychotic features: Secondary | ICD-10-CM

## 2022-12-28 ENCOUNTER — Encounter (HOSPITAL_COMMUNITY): Payer: Self-pay

## 2022-12-28 ENCOUNTER — Other Ambulatory Visit (HOSPITAL_COMMUNITY): Payer: Self-pay

## 2022-12-28 MED ORDER — LITHIUM CARBONATE ER 450 MG PO TBCR
450.0000 mg | EXTENDED_RELEASE_TABLET | Freq: Every day | ORAL | 0 refills | Status: DC
Start: 2022-12-28 — End: 2023-06-19
  Filled 2022-12-28 – 2023-01-18 (×2): qty 90, 90d supply, fill #0

## 2022-12-28 NOTE — Telephone Encounter (Signed)
LV 10/07/22; NV 12/30/22; LF 047/25/24; Not going to reorder dt pt appt on Friday.

## 2022-12-29 ENCOUNTER — Other Ambulatory Visit (HOSPITAL_COMMUNITY): Payer: Self-pay

## 2022-12-30 ENCOUNTER — Encounter: Payer: Self-pay | Admitting: Psychiatry

## 2022-12-30 ENCOUNTER — Telehealth: Payer: Commercial Managed Care - PPO | Admitting: Psychiatry

## 2022-12-30 DIAGNOSIS — F5105 Insomnia due to other mental disorder: Secondary | ICD-10-CM

## 2022-12-30 DIAGNOSIS — F314 Bipolar disorder, current episode depressed, severe, without psychotic features: Secondary | ICD-10-CM | POA: Diagnosis not present

## 2022-12-30 DIAGNOSIS — E871 Hypo-osmolality and hyponatremia: Secondary | ICD-10-CM | POA: Diagnosis not present

## 2022-12-30 DIAGNOSIS — F900 Attention-deficit hyperactivity disorder, predominantly inattentive type: Secondary | ICD-10-CM | POA: Diagnosis not present

## 2022-12-30 DIAGNOSIS — F411 Generalized anxiety disorder: Secondary | ICD-10-CM

## 2022-12-30 DIAGNOSIS — G4733 Obstructive sleep apnea (adult) (pediatric): Secondary | ICD-10-CM | POA: Diagnosis not present

## 2022-12-30 DIAGNOSIS — Z79899 Other long term (current) drug therapy: Secondary | ICD-10-CM

## 2022-12-30 NOTE — Progress Notes (Signed)
Shawn Cervantes [id number] [birth year] 52 y.o.  Video Visit via My Chart  I connected with pt by video using My Chart and verified that I am speaking with the correct person using two identifiers.   I discussed the limitations, risks, security and privacy concerns of performing an evaluation and management service by My Chart  and the availability of in person appointments. I also discussed with the patient that there may be a patient responsible charge related to this service. The patient expressed understanding and agreed to proceed.  I discussed the assessment and treatment plan with the patient. The patient was provided an opportunity to ask questions and all were answered. The patient agreed with the plan and demonstrated an understanding of the instructions.   The patient was advised to call back or seek an in-person evaluation if the symptoms worsen or if the condition fails to improve as anticipated.  I provided 30 minutes of video time during this encounter.  The patient was located at home and the provider was located office. Session from 230-300  Subjective:   [name] is a 52 y.o. (DOB May 11, 1970) male.  Chief Complaint:  Chief Complaint  Patient presents with   Follow-up   Depression   Fatigue   Anxiety   Sleeping Problem    HPI [name] presents to the office today for follow-up of bipolar depression and panic disorder and ADD.  On 12/24/21 started Trintellix and up to 10 mg daily.  And changed Adderall to concerta 54 mg Am No sig effect from Concerta 54. Maybe a little loss of energy with switch. NoSE with it. NO SE with trintellix change. No change in energy.   F in law died.   Maybe more depressed than he's gotten used to being.  Hard to make himself go out.  Can't fake his way through it right now. Anxiety settled after adjusted to Trintellix but still more.  Anxiety about the same as when on Paxil.  Sleep pattern is same but if anything  more sleep than normal.  But some days EFA.  Harder to wake in the morning.   Mirtazapine plus Quiviq helps initial insomnia.  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 Increase Trintellix to 20 mg daily for treatment resistant bipolar depression  03/18/22 appt noted: Sleep better off Xtampza and on morphine ER 15 BID. No benefit or SE with Trintellix 20 mg daily. Anxiety is ok but not great.  Not the thing that's keeping me from moving DT depression and fatigue. Chronic apathy so why bother doing anything.  Will get anxious thinking about the downward spiral. Concerta 72 mg no better than Adderall.  Doesn't take it daily bc doesn't seem to help.  Stopped it for a couple of weeks.  Did take it consistently at 54 mg .  Didn't notice much from the 72 mg being different. Thinks he originally got benefit from Adderall. Has decided the pain meds make him tired and apathetic and wants to try to get off pain meds.   Sleep with Ambien occ. Plan: Concerta without help so return to Adderall.   Increase Trintellix to 30 mg daily for treatment resistant bipolar depression for 2 weeks.  If NR stop it.  This is off label. Continue mirtazapine 30 mg HS.   OK prn zolpidem  04/08/22 appt noted: Overused the  Ambien and out.     Took 30 mg Trintellix for a week or so and stopped it DT NR. Stopped pain pills for a couple of week and used Kratom to help with withdrawal.  Went to doctor yesterday and he got immediate release morphine 15 mg and got #120 pills. Withdrawal was better within a week or so.  Current meds:  Adderall XR 20 daily, flexeril 10 mg HS, Quiviviq, Depakote ER 1000 mg HS, lamotrigine 100 mg daily, lithium Cr 450 HS, mirtazapine 30 mg HS, stopped Trintellix, out of Ambien. Better overall mood since off the opiate.  Adderall seems more effective off the opiate.  Would like to go back up on the  amount to 30 XR and 15 IR daily. Beth says he's been up out of bed more than he was and applied for some jobs  so she notices some benefit. Last couple of weeks anxiety is pretty good.   Plan: Concerta without help so return to Adderall.   DC Trintellix Agrees to [employer] but go really low to start bc got agitated on it before. Continue mirtazapine 30 mg HS.   DC Ambien and no further RX for it.  05/18/2022 appointment noted: About 2 to 3 weeks ago stopped Latuda 20 mg because he felt agitated. Wife reports she is not doing well with regard to depression. 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  W doesn't think his thinking realistic.  He's been in bed for a week off Latuda. Got scammed in job situation.  Haven't felt as bad as this in a long time.  Feels like a disappointment.  Plan Amantadine 100 mg HS for a week then 100 mg BID Concerta without help so return to Adderall.   DC  mirtazapine 30 mg HS Seroquel 25-50 mg HS   DC Ambien and no further RX for it.  06/17/22 appt noted: Taking amantadine twice daily.  Didn't really notice anything as far as benefit. Anxiety has been up more "on fire" inside and feels afraid like something bad is about to happen.  Worse 3 days last week and 3 days this week crippling at times and usually mid afternoon.   Still too sleepy and tired to do anything and this causes anxiety.  Energy better briefly but down again.   Sleep better with Seroquel at nnight and more benefit.  But trouble waking up More desire to be engaged with people off all opiates.   Plan: DC Amantadine 100 Start Vraylar 1.5 mg QOD  Adderall.   Seroquel 25-50 mg HS   DC Ambien and no further RX for it. Increase Depakote ER 1500 mg HS  07/22/22 appt noted: No opiate for almost 2 mos.  Energy is better and once awake can stay awake. Pain is roughly the same but more consistent than when on opiates.  And can have spikes. Kratom helps without SE and occ delta 8 on separate occ and  it helps pain. No crazy buzz like THC. Meds: Vraylar 1.5 mg daily, Vit D 50 K week, Seroquel 50 mg HS, Depakote Er 1500 HS, lamotrigine 100 BID , lithium ER 450 HS, Adderall XR 30 AM and IR 10 mg PM Started testosterone which might help mood and energy too.   More consistent.   A little less dep and a little better energy.  Some hangover with Seroquel and hard to get up 7 AM.  Sleep much better with Seroquel.  Sometimes sleeps through night.  No other  SE.   No akathisia. Not much anxiety at all lately.   Dep 4/10.  Definitely best in a long time.    10/07/22 appt noted: CPAP causes anxiety dealing with it using gabapentin. Sleep 5-6 hours with occ of more.   Meds as above  gabapentin as noted. For the most part ok.  A lot of trouble getting up in the AM chronically but worse the last couple of weeks.  Good bit of fatigue gradually better.   Adderall doesn't wake him up.   Managing to do a few things during the day now.  Takes 1 and 1/2 hour to go to sleep chronically.  To sleep about 1130.  Awakens 2-3 times per night and night eat and then gets up late about noon.  In bed 12 hours daily.  Will sleep solid 7-10 AM regularly.   Always hard to wake up but may be worse with Seroquel.   Been stressing some and a little low side with dep.  Still has some panic.  Situational usually.   SE mild akathisia  12/30/22 appt noted: Not needing Seroquel usually for sleep.  Gabapentin now helping that and anxiety at 600-900 mg daily and helps pain some .  Some hangover but less than Seroquel. Most of time more alert with Adderall and Sunosi together.  However some days still drags.  No clear reason.  Last night slept well but still dragging today and then just wants to close his eyes.  He's having trouble functioning on days like this.   No SE  with it.   Mood up and down but more up than down since here.  When down is more isolated and withdrawn but usually brief.  More normal days with current med regimen.   Fatigue will cause him to get down. Sept is usually down month.  Was better with this this year.  Wife has noted his upswings in mood.  Can be a bit impulsive with upswings but not severe.  Can return things if needed.   10 yr ago back injury and can't do what he did back then.   SE some hangover with gabapentin seems unrelated to amount he takes.  Positional tremor with VPA.   Started injx testosterone for a month. Doing as well as the gel.  Libido is better.  Maybe recover better from physical activity.     Past Psychiatric Medication Trials: Depakote 1500 Oxcarbazepine hyponatremia Lamotrigine 100 mg twice daily Lithium 900 tremor Saphris 20 sed Seroquel 600 sedation Olanzapine Vraylar 3 mg no response and some akathisia Caplyta restlessness Latuda questionable dose, brief and possibly agitated, 20 mg retry agitated & angry Rexulti  Pramipexole no response and restless Amantadine 100 BID NR  Paroxetine 40 sleepy, fluoxetine,  nortriptyline anxiety,  Wellbutrin no response,  Auvelity no response Trintellix 30 for a week, NR  Gabapentin 4000 mg no response for pain.  Benefit anxiety and sleep Lyrica SE Clonazepam 0.5 mg twice daily, alprazolam sedation Trazodone no response Ambien and Ambien CR abuse,  hydroxyzine, Lunesta, mirtazapine 30,  Thorazine 25 side effects of night eating, Seroquel 50 for sleep Quiviq Lyrica Adderall, modafinil 300, Concerta 72 mg AM poor response  Psychiatric hospitalization in 1997  Flowsheet Row ED from 10/31/2021 in [facility] RISK CATEGORY No Risk        Review of Systems:  Review of Systems  Constitutional:  Positive for fatigue.  Musculoskeletal:  Positive for back pain.  Neurological:  Positive for tremors.  Psychiatric/Behavioral:  Positive for decreased concentration, dysphoric mood and sleep disturbance. The patient is nervous/anxious.     Medications: I have reviewed the patient's current  medications.  Current Outpatient Medications  Medication Sig Dispense Refill   acetaminophen (TYLENOL) 325 MG tablet Take 650 mg by mouth 2 (two) times daily as needed for moderate pain.     amphetamine-dextroamphetamine (ADDERALL XR) 30 MG 24 hr capsule Take 1 capsule (30 mg total) by mouth daily. 30 capsule 0   amphetamine-dextroamphetamine (ADDERALL) 15 MG tablet Take 1 tablet (15 mg) by mouth daily in the afternoon. 30 tablet 0   cariprazine (VRAYLAR) 1.5 MG capsule Take 1.5 mg by mouth daily.     cyclobenzaprine (FLEXERIL) 10 MG tablet Take 1 tablet (10 mg total) by mouth at bedtime as needed for pain 30 tablet 2   divalproex (DEPAKOTE ER) 500 MG 24 hr tablet Take 4 tablets (2,000 mg total) by mouth at bedtime. (Patient taking differently: Take 1,500 mg by mouth at bedtime.) 360 tablet 0   gabapentin (NEURONTIN) 300 MG capsule Take 1 capsule (300 mg total) by mouth 3 (three) times daily. (Patient taking differently: Take 300 mg by mouth 3 (three) times daily. 2-3 capsules in evening for anxiety) 90 capsule 2   ibuprofen (ADVIL,MOTRIN) 200 MG tablet Take 400 mg by mouth daily as needed for moderate pain.     lamoTRIgine (LAMICTAL) 100 MG tablet Take 1 tablet (100 mg total) by mouth 2 (two) times daily. 180 tablet 0   lidocaine (LIDODERM) 5 % Place 1-3 patches onto the skin as directed. 270 patch 0   lithium carbonate (ESKALITH) 450 MG ER tablet Take 1 tablet (450 mg total) by mouth at bedtime. 90 tablet 0   Omega-3 Fatty Acids (FISH OIL) 1000 MG CAPS Take 1,000 mg by mouth 2 (two) times daily.     predniSONE (DELTASONE) 20 MG tablet Take 1 tablet (20 mg total) by mouth 2 (two) times daily for 5 days 10 tablet 0   Solriamfetol HCl (SUNOSI) 150 MG TABS Take 1 tablet (150 mg total) by mouth every morning. 30 tablet 1   SYRINGE-NEEDLE, DISP, 3 ML (B-D 3CC LUER-LOK SYR 21GX1-1/2) 21G X 1-1/2" 3 ML MISC Use with testosterone 15 each 5   Testosterone 10 MG/ACT (2%) GEL Apply 60 mg topically daily. 6  pumps = 10 mg per pump for hormonal replacement     Testosterone 12.5 MG/ACT (1%) GEL Apply 3 pumps to each thigh once daily 150 g 5   testosterone cypionate (DEPOTESTOSTERONE CYPIONATE) 200 MG/ML injection Inject 0.5 mLs (100 mg total) into the muscle once a week. 10 mL 5   Vitamin D, Ergocalciferol, (DRISDOL) 1.25 MG (50000 UNIT) CAPS capsule Take 1 capsule (50,000 Units total) by mouth every 7 (seven) days. 15 capsule 1   vitamin E 1000 UNIT capsule Take 1,000 Units by mouth at bedtime.     cyclobenzaprine (FLEXERIL) 10 MG tablet Take 1 tablet (10 mg total) by mouth 3 (three) times daily as needed for muscle spasms. (Patient taking differently: Take 10 mg by mouth at bedtime.) 1 tablet 0   QUEtiapine (SEROQUEL) 50 MG tablet Take 1 tablet (50 mg total) by mouth at bedtime. (Patient not taking: Reported on 12/30/2022) 90 tablet 0   Testosterone 10 MG/ACT (2%) GEL Apply 3 pumps to each thigh once daily. 150 g 5   No current facility-administered medications for this visit.    Medication Side Effects: None  Allergies:  Allergies  Allergen Reactions   Trileptal [Oxcarbazepine] Other (See Comments)    Low sodium    Past Medical History:  Diagnosis Date   ADHD (attention deficit hyperactivity disorder)    Anxiety    Arthritis    knee and back   Bipolar 1 disorder (HCC)    Complication of anesthesia    Constipation    Depression    bi polar   Head injury    as a child ;had stitches in head   Mental disorder    Bipolar   PONV (postoperative nausea and vomiting)    Testosterone deficiency    Trigeminal neuralgia of left side of face 04/01/2019   Left V2    Past Medical History, Surgical history, Social history, and Family history were reviewed and updated as appropriate.   Please see review of systems for further details on the patient's review from today.   Objective:   Physical Exam:  There were no vitals taken for this visit.  Physical Exam Neurological:     Mental  Status: He is alert and oriented to person, place, and time.     Cranial Nerves: No dysarthria.  Psychiatric:        Attention and Perception: Attention and perception normal.        Mood and Affect: Mood is anxious and depressed. Affect is not blunt.        Speech: Speech normal.        Behavior: Behavior is cooperative.        Thought Content: Thought content normal. Thought content is not paranoid or delusional. Thought content does not include homicidal or suicidal ideation. Thought content does not include suicidal plan.        Cognition and Memory: Cognition and memory normal.        Judgment: Judgment normal.     Comments: Insight intact More expressive in voice .  Less blunted affect Better affect and expressiveness.   Still some anxiety situationally.       Lab Review:     Component Value Date/Time   NA 139 05/25/2021 1501   K 4.7 05/25/2021 1501   CL 100 05/25/2021 1501   CO2 25 05/25/2021 1501   GLUCOSE 100 (H) 05/25/2021 1501   GLUCOSE 115 (H) 08/02/2016 1416   BUN 11 05/25/2021 1501   CREATININE 0.80 05/25/2021 1501   CALCIUM 9.3 05/25/2021 1501   PROT 6.9 09/22/2020 0927   ALBUMIN 4.5 09/22/2020 0927   AST 18 09/22/2020 0927   ALT 38 09/22/2020 0927   ALKPHOS 61 09/22/2020 0927   BILITOT <0.2 09/22/2020 0927   GFRNONAA >60 08/02/2016 1416   GFRAA >60 08/02/2016 1416       Component Value Date/Time   WBC 5.5 08/02/2016 1135   RBC 4.94 08/02/2016 1135   HGB 15.4 08/02/2016 1135   HCT 42.5 08/02/2016 1135   PLT 262 08/02/2016 1135   MCV 86.0 08/02/2016 1135   MCH 31.2 08/02/2016 1135   MCHC 36.2 (H) 08/02/2016 1135   RDW 12.3 08/02/2016 1135   LYMPHSABS 1.3 08/02/2016 1135   MONOABS 0.4 08/02/2016 1135   EOSABS 0.0 08/02/2016 1135   BASOSABS 0.0 08/02/2016 1135    Lithium Lvl  Date Value Ref Range Status  05/25/2021 0.5 0.5 - 1.2 mmol/L Final    Comment:    A concentration of 0.5-0.8 mmol/L is advised for long-term use; concentrations of up to  1.2 mmol/L may be necessary during acute treatment.  Detection Limit = 0.1                           <0.1 indicates None Detected      Lab Results  Component Value Date   VALPROATE 51 05/30/2019     .res Assessment: Plan:    Sandra was seen today for follow-up, depression, fatigue, anxiety and sleeping problem.  Diagnoses and all orders for this visit:  Bipolar disorder with severe depression (HCC) -     Valproic acid level  Hyponatremia -     Basic metabolic panel  Encounter for long-term (current) use of high-risk medication -     Basic metabolic panel  Attention deficit hyperactivity disorder (ADHD), predominantly inattentive type  Obstructive sleep apnea hypopnea, severe  Generalized anxiety disorder  Insomnia due to mental condition    30 min non face to face time with patient was spent on counseling and coordination of care. 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  Multiple medication failures. At this time generalized anxiety  managed. Depression about 50% better.   No opiates for several mos.  Better energy.   But still mood cycles with fatigue.  In last 6 weeks about a week of normal but better than it was.    Insomnia is a chronic intermittent problem.  Better on gabapentin.  But still a problem with sleep hygiene part of the problem.  Too much time in bed.  Disc sleep hygiene.    Discussed potential benefits, risks, and side effects of stimulants with patient to include increased heart rate, palpitations, insomnia, increased anxiety, increased irritability, or decreased appetite.  Instructed patient to contact office if experiencing any significant tolerability issues.  OSA severe on CPAP as of early June 2024 Consider Sunosi for fatigue and sleepiness to further help function.    Option clozapine, or Seroquel, Option ECT.  Disc in detail.  Option selegiline DT excessive sleepiness and fatigue.  Retry  pramipexole (highest was 1.5 mg daily) but hx restlessness with it.  DC Vraylar 1.5 mg QD bc ? Benefit and polypharmacy.   Cont Adderall XR 30 AM and IR 15 mg PM   Stopped Seroquel 25-50 mg HS   Continue Depakote ER 1500 mg HS.  Consider incr for swings.  Check level.   Continue lithium CR 450 Mg HS  Gabapentin 900 mg pm for sleep and anxiety and some pain benefit  For focus and alertness Sunosi 150 mg AM  DC Ambien and no further RX for it.  Rec counseling.  Last was a little and was online in 2022.  Disc this again.   Counseling 18 min: Disc stressors and the typical recovery issues associated with chronic dep including effects on family and his self confidence.  Disc systems theory in relations to this.  Check sodium to ro recurrent hyponatremia.   Follow-up 1 month  Discussed side effects of each medication.  Meredith Staggers MD, DFAPA   Please see After Visit Summary for patient specific instructions.  Future Appointments  Date Time Provider Department Center  12/30/2022  3:30 PM Cottle, Steva Ready., MD [facility] None     Orders Placed This Encounter  Procedures   Basic metabolic panel   Valproic acid level    -------------------------------bi

## 2023-01-06 ENCOUNTER — Other Ambulatory Visit (HOSPITAL_COMMUNITY): Payer: Self-pay

## 2023-01-09 ENCOUNTER — Other Ambulatory Visit (HOSPITAL_COMMUNITY): Payer: Self-pay

## 2023-01-12 ENCOUNTER — Other Ambulatory Visit (HOSPITAL_COMMUNITY): Payer: Self-pay

## 2023-01-16 DIAGNOSIS — G4733 Obstructive sleep apnea (adult) (pediatric): Secondary | ICD-10-CM | POA: Diagnosis not present

## 2023-01-18 ENCOUNTER — Other Ambulatory Visit (HOSPITAL_COMMUNITY): Payer: Self-pay

## 2023-01-18 ENCOUNTER — Other Ambulatory Visit: Payer: Self-pay

## 2023-01-18 ENCOUNTER — Other Ambulatory Visit: Payer: Self-pay | Admitting: Psychiatry

## 2023-01-18 DIAGNOSIS — F900 Attention-deficit hyperactivity disorder, predominantly inattentive type: Secondary | ICD-10-CM

## 2023-01-18 DIAGNOSIS — F411 Generalized anxiety disorder: Secondary | ICD-10-CM

## 2023-01-18 DIAGNOSIS — G4733 Obstructive sleep apnea (adult) (pediatric): Secondary | ICD-10-CM

## 2023-01-18 MED ORDER — GABAPENTIN 300 MG PO CAPS
300.0000 mg | ORAL_CAPSULE | Freq: Three times a day (TID) | ORAL | 0 refills | Status: DC
  Filled 2023-01-18 – 2023-01-23 (×3): qty 90, 30d supply, fill #0

## 2023-01-18 MED ORDER — AMPHETAMINE-DEXTROAMPHETAMINE 15 MG PO TABS
15.0000 mg | ORAL_TABLET | Freq: Every day | ORAL | 0 refills | Status: DC
Start: 2023-01-23 — End: 2023-02-17
  Filled 2023-01-23: qty 30, 30d supply, fill #0

## 2023-01-18 MED ORDER — AMPHETAMINE-DEXTROAMPHET ER 30 MG PO CP24
30.0000 mg | ORAL_CAPSULE | Freq: Every day | ORAL | 0 refills | Status: DC
Start: 2023-01-23 — End: 2023-02-17
  Filled 2023-01-23: qty 30, 30d supply, fill #0

## 2023-01-18 MED ORDER — SUNOSI 150 MG PO TABS
150.0000 mg | ORAL_TABLET | Freq: Every morning | ORAL | 1 refills | Status: DC
Start: 2023-01-23 — End: 2023-03-24
  Filled 2023-01-18 – 2023-01-23 (×3): qty 30, 30d supply, fill #0
  Filled 2023-02-17 – 2023-02-20 (×2): qty 30, 30d supply, fill #1

## 2023-01-18 NOTE — Telephone Encounter (Signed)
Please schedule pt an appt

## 2023-01-18 NOTE — Telephone Encounter (Signed)
Pt has scheduled appt 11/22

## 2023-01-18 NOTE — Telephone Encounter (Signed)
LF ALL MEDS 10/14; DUE 11/11; NV requested pt to be scheduled.  Changed start dates to all medications.

## 2023-01-19 ENCOUNTER — Other Ambulatory Visit (HOSPITAL_COMMUNITY): Payer: Self-pay

## 2023-01-23 ENCOUNTER — Other Ambulatory Visit: Payer: Self-pay

## 2023-01-23 ENCOUNTER — Other Ambulatory Visit (HOSPITAL_COMMUNITY): Payer: Self-pay

## 2023-01-25 ENCOUNTER — Other Ambulatory Visit (HOSPITAL_COMMUNITY): Payer: Self-pay

## 2023-02-03 ENCOUNTER — Encounter: Payer: Self-pay | Admitting: Psychiatry

## 2023-02-03 ENCOUNTER — Telehealth: Payer: Commercial Managed Care - PPO | Admitting: Psychiatry

## 2023-02-03 DIAGNOSIS — G4733 Obstructive sleep apnea (adult) (pediatric): Secondary | ICD-10-CM

## 2023-02-03 DIAGNOSIS — F5105 Insomnia due to other mental disorder: Secondary | ICD-10-CM | POA: Diagnosis not present

## 2023-02-03 DIAGNOSIS — F411 Generalized anxiety disorder: Secondary | ICD-10-CM

## 2023-02-03 DIAGNOSIS — F314 Bipolar disorder, current episode depressed, severe, without psychotic features: Secondary | ICD-10-CM

## 2023-02-03 DIAGNOSIS — F900 Attention-deficit hyperactivity disorder, predominantly inattentive type: Secondary | ICD-10-CM

## 2023-02-03 NOTE — Progress Notes (Signed)
Shawn Cervantes [id number] Jan 06, 1971 52 y.o.  Video Visit via My Chart  I connected with pt by video using My Chart and verified that I am speaking with the correct person using two identifiers.   I discussed the limitations, risks, security and privacy concerns of performing an evaluation and management service by My Chart  and the availability of in person appointments. I also discussed with the patient that there may be a patient responsible charge related to this service. The patient expressed understanding and agreed to proceed.  I discussed the assessment and treatment plan with the patient. The patient was provided an opportunity to ask questions and all were answered. The patient agreed with the plan and demonstrated an understanding of the instructions.   The patient was advised to call back or seek an in-person evaluation if the symptoms worsen or if the condition fails to improve as anticipated.  I provided 30 minutes of video time during this encounter.  The patient was located at home and the provider was located office. Session from [id number]  Subjective:   [name] is a 52 y.o. (DOB 21-Dec-1970) male.  Chief Complaint:  Chief Complaint  Patient presents with   Follow-up   Depression   Anxiety   Sleeping Problem   Fatigue   ADD    HPI Shawn Cervantes presents to the office today for follow-up of bipolar depression and panic disorder and ADD.  On 12/24/21 started Trintellix and up to 10 mg daily.  And changed Adderall to concerta 54 mg Am No sig effect from Concerta 54. Maybe a little loss of energy with switch. NoSE with it. NO SE with trintellix change. No change in energy.   F in law died.   Maybe more depressed than he's gotten used to being.  Hard to make himself go out.  Can't fake his way through it right now. Anxiety settled after adjusted to Trintellix but still more.  Anxiety about the same as when on Paxil.  Sleep pattern is same but if  anything more sleep than normal.  But some days EFA.  Harder to wake in the morning.   Mirtazapine plus Quiviq helps initial insomnia.  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 Increase Trintellix to 20 mg daily for treatment resistant bipolar depression  03/18/22 appt noted: Sleep better off Xtampza and on morphine ER 15 BID. No benefit or SE with Trintellix 20 mg daily. Anxiety is ok but not great.  Not the thing that's keeping me from moving DT depression and fatigue. Chronic apathy so why bother doing anything.  Will get anxious thinking about the downward spiral. Concerta 72 mg no better than Adderall.  Doesn't take it daily bc doesn't seem to help.  Stopped it for a couple of weeks.  Did take it consistently at 54 mg .  Didn't notice much from the 72 mg being different. Thinks he originally got benefit from Adderall. Has decided the pain meds make him tired and apathetic and wants to try to get off pain meds.   Sleep with Ambien occ. Plan: Concerta without help so return to Adderall.   Increase Trintellix to 30 mg daily for treatment resistant bipolar depression for 2 weeks.  If NR stop it.  This is off label. Continue mirtazapine 30 mg HS.   OK prn zolpidem  04/08/22 appt  noted: Overused the Ambien and out.     Took 30 mg Trintellix for a week or so and stopped it DT NR. Stopped pain pills for a couple of week and used Kratom to help with withdrawal.  Went to doctor yesterday and he got immediate release morphine 15 mg and got #120 pills. Withdrawal was better within a week or so.  Current meds:  Adderall XR 20 daily, flexeril 10 mg HS, Quiviviq, Depakote ER 1000 mg HS, lamotrigine 100 mg daily, lithium Cr 450 HS, mirtazapine 30 mg HS, stopped Trintellix, out of Ambien. Better overall mood since off the opiate.  Adderall seems more effective off the opiate.  Would like to go back up  on the amount to 30 XR and 15 IR daily. Beth says he's been up out of bed more than he was and applied for some jobs  so she notices some benefit. Last couple of weeks anxiety is pretty good.   Plan: Concerta without help so return to Adderall.   DC Trintellix Agrees to [employer] but go really low to start bc got agitated on it before. Continue mirtazapine 30 mg HS.   DC Ambien and no further RX for it.  05/18/2022 appointment noted: About 2 to 3 weeks ago stopped Latuda 20 mg because he felt agitated. Wife reports she is not doing well with regard to depression. 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  W doesn't think his thinking realistic.  He's been in bed for a week off Latuda. Got scammed in job situation.  Haven't felt as bad as this in a long time.  Feels like a disappointment.  Plan Amantadine 100 mg HS for a week then 100 mg BID Concerta without help so return to Adderall.   DC  mirtazapine 30 mg HS Seroquel 25-50 mg HS   DC Ambien and no further RX for it.  06/17/22 appt noted: Taking amantadine twice daily.  Didn't really notice anything as far as benefit. Anxiety has been up more "on fire" inside and feels afraid like something bad is about to happen.  Worse 3 days last week and 3 days this week crippling at times and usually mid afternoon.   Still too sleepy and tired to do anything and this causes anxiety.  Energy better briefly but down again.   Sleep better with Seroquel at nnight and more benefit.  But trouble waking up More desire to be engaged with people off all opiates.   Plan: DC Amantadine 100 Start Vraylar 1.5 mg QOD  Adderall.   Seroquel 25-50 mg HS   DC Ambien and no further RX for it. Increase Depakote ER 1500 mg HS  07/22/22 appt noted: No opiate for almost 2 mos.  Energy is better and once awake can stay awake. Pain is roughly the same but more consistent than when on opiates.  And can have spikes. Kratom helps without SE and occ delta 8 on separate  occ and it helps pain. No crazy buzz like THC. Meds: Vraylar 1.5 mg daily, Vit D 50 K week, Seroquel 50 mg HS, Depakote Er 1500 HS, lamotrigine 100 BID , lithium ER 450 HS, Adderall XR 30 AM and IR 10 mg PM Started testosterone which might help mood and energy too.   More consistent.   A little less dep and a little better energy.  Some hangover with Seroquel and hard to get up 7 AM.  Sleep much better with Seroquel.  Sometimes sleeps through night.  No other SE.   No akathisia. Not much anxiety at all lately.   Dep 4/10.  Definitely best in a long time.    10/07/22 appt noted: CPAP causes anxiety dealing with it using gabapentin. Sleep 5-6 hours with occ of more.   Meds as above  gabapentin as noted. For the most part ok.  A lot of trouble getting up in the AM chronically but worse the last couple of weeks.  Good bit of fatigue gradually better.   Adderall doesn't wake him up.   Managing to do a few things during the day now.  Takes 1 and 1/2 hour to go to sleep chronically.  To sleep about 1130.  Awakens 2-3 times per night and night eat and then gets up late about noon.  In bed 12 hours daily.  Will sleep solid 7-10 AM regularly.   Always hard to wake up but may be worse with Seroquel.   Been stressing some and a little low side with dep.  Still has some panic.  Situational usually.   SE mild akathisia  12/30/22 appt noted: Not needing Seroquel usually for sleep.  Gabapentin now helping that and anxiety at 600-900 mg daily and helps pain some .  Some hangover but less than Seroquel. Most of time more alert with Adderall and Sunosi together.  However some days still drags.  No clear reason.  Last night slept well but still dragging today and then just wants to close his eyes.  He's having trouble functioning on days like this.   No SE  with it.   Mood up and down but more up than down since here.  When down is more isolated and withdrawn but usually brief.  More normal days with current med  regimen.  Fatigue will cause him to get down. Sept is usually down month.  Was better with this this year.  Wife has noted his upswings in mood.  Can be a bit impulsive with upswings but not severe.  Can return things if needed.   10 yr ago back injury and can't do what he did back then.   SE some hangover with gabapentin seems unrelated to amount he takes.  Positional tremor with VPA.   Started injx testosterone for a month. Doing as well as the gel.  Libido is better.  Maybe recover better from physical activity.   Plan: DC Vraylar 1.5 mg QD bc ? Benefit and polypharmacy.    02/03/23 appt noted: Psych meds: stopped Vraylar.  Adderall XR 30 & IR 15 mg PM, Sunosi 150, Depakote ER 1500 HS, gabapentin 300 TID, lamotrigine 100 BID, Eskalith CR 450 HS.    Not taking Seroquel for sleep.  SE hangover Stopped Vraylar and more dep and irritable, increased fear feeling on sense of doom.  Mood definitely worse off the Vraylar.   Situational stress.  Still don't have a job.  A lot of trouble carrying through on things.  Fatigue with dep.  Napping a couple of times per day.  Poor function making marriage worse.     Past Psychiatric Medication Trials: Depakote 1500 Oxcarbazepine hyponatremia Lamotrigine 100 mg twice daily Lithium 900 tremor Saphris 20 sed Seroquel 600 sedation Olanzapine Vraylar 3 mg no response and some akathisia Caplyta restlessness Latuda questionable dose, brief and possibly agitated, 20 mg retry agitated & angry Rexulti  Pramipexole no response and restless Amantadine 100 BID NR  Paroxetine 40 sleepy, fluoxetine,  nortriptyline anxiety,  Wellbutrin no response,  Auvelity  no response Trintellix 30 for a week, NR  Gabapentin 4000 mg no response for pain.  Benefit anxiety and sleep Lyrica SE Clonazepam 0.5 mg twice daily, alprazolam sedation Trazodone no response Ambien and Ambien CR abuse,  hydroxyzine, Lunesta, mirtazapine 30,  Thorazine 25 side effects of night  eating, Seroquel 50 for sleep Quiviq Lyrica Adderall, modafinil 300, Concerta 72 mg AM poor response  Psychiatric hospitalization in 1997  Flowsheet Row ED from 10/31/2021 in [facility] RISK CATEGORY No Risk        Review of Systems:  Review of Systems  Constitutional:  Positive for fatigue.  Musculoskeletal:  Positive for back pain.  Neurological:  Positive for tremors.  Psychiatric/Behavioral:  Positive for decreased concentration, dysphoric mood and sleep disturbance. The patient is nervous/anxious.     Medications: I have reviewed the patient's current medications.  Current Outpatient Medications  Medication Sig Dispense Refill   acetaminophen (TYLENOL) 325 MG tablet Take 650 mg by mouth 2 (two) times daily as needed for moderate pain.     cyclobenzaprine (FLEXERIL) 10 MG tablet Take 1 tablet (10 mg total) by mouth at bedtime as needed for pain 30 tablet 2   divalproex (DEPAKOTE ER) 500 MG 24 hr tablet Take 4 tablets (2,000 mg total) by mouth at bedtime. (Patient taking differently: Take 1,500 mg by mouth at bedtime.) 360 tablet 0   ibuprofen (ADVIL,MOTRIN) 200 MG tablet Take 400 mg by mouth daily as needed for moderate pain.     lidocaine (LIDODERM) 5 % Place 1-3 patches onto the skin as directed. 270 patch 0   lithium carbonate (ESKALITH) 450 MG ER tablet Take 1 tablet (450 mg total) by mouth at bedtime. 90 tablet 0   Omega-3 Fatty Acids (FISH OIL) 1000 MG CAPS Take 1,000 mg by mouth 2 (two) times daily.     Solriamfetol HCl (SUNOSI) 150 MG TABS Take 1 tablet (150 mg total) by mouth every morning. 30 tablet 1   SYRINGE-NEEDLE, DISP, 3 ML (B-D 3CC LUER-LOK SYR 21GX1-1/2) 21G X 1-1/2" 3 ML MISC Use with testosterone 15 each 5   Testosterone 10 MG/ACT (2%) GEL Apply 60 mg topically daily. 6 pumps = 10 mg per pump for hormonal replacement     Testosterone 12.5 MG/ACT (1%) GEL Apply 3 pumps to each thigh once daily 150 g 5   testosterone  cypionate (DEPOTESTOSTERONE CYPIONATE) 200 MG/ML injection Inject 0.5 mLs (100 mg total) into the muscle once a week. 10 mL 5   vitamin E 1000 UNIT capsule Take 1,000 Units by mouth at bedtime.     amphetamine-dextroamphetamine (ADDERALL XR) 30 MG 24 hr capsule Take 1 capsule (30 mg total) by mouth daily. 30 capsule 0   amphetamine-dextroamphetamine (ADDERALL) 15 MG tablet Take 1 tablet (15 mg) by mouth daily in the afternoon. 30 tablet 0   cyclobenzaprine (FLEXERIL) 10 MG tablet Take 1 tablet (10 mg total) by mouth 3 (three) times daily as needed for muscle spasms. (Patient taking differently: Take 10 mg by mouth at bedtime.) 1 tablet 0   gabapentin (NEURONTIN) 300 MG capsule Take 1 capsule (300 mg total) by mouth 3 (three) times daily. 90 capsule 0   lamoTRIgine (LAMICTAL) 100 MG tablet Take 1 tablet (100 mg total) by mouth 2 (two) times daily. 180 tablet 0   Testosterone 10 MG/ACT (2%) GEL Apply 3 pumps to each thigh once daily. 150 g 5   Vitamin D, Ergocalciferol, (DRISDOL) 1.25 MG (50000 UNIT)  CAPS capsule Take 1 capsule (50,000 Units total) by mouth every 7 (seven) days. 15 capsule 1   No current facility-administered medications for this visit.    Medication Side Effects: None  Allergies:  Allergies  Allergen Reactions   Trileptal [Oxcarbazepine] Other (See Comments)    Low sodium    Past Medical History:  Diagnosis Date   ADHD (attention deficit hyperactivity disorder)    Anxiety    Arthritis    knee and back   Bipolar 1 disorder (HCC)    Complication of anesthesia    Constipation    Depression    bi polar   Head injury    as a child ;had stitches in head   Mental disorder    Bipolar   PONV (postoperative nausea and vomiting)    Testosterone deficiency    Trigeminal neuralgia of left side of face 04/01/2019   Left V2    Past Medical History, Surgical history, Social history, and Family history were reviewed and updated as appropriate.   Please see review of systems  for further details on the patient's review from today.   Objective:   Physical Exam:  There were no vitals taken for this visit.  Physical Exam Neurological:     Mental Status: He is alert and oriented to person, place, and time.     Cranial Nerves: No dysarthria.  Psychiatric:        Attention and Perception: Attention and perception normal.        Mood and Affect: Mood is anxious and depressed. Affect is not blunt.        Speech: Speech normal.        Behavior: Behavior is cooperative.        Thought Content: Thought content normal. Thought content is not paranoid or delusional. Thought content does not include homicidal or suicidal ideation. Thought content does not include suicidal plan.        Cognition and Memory: Cognition and memory normal.        Judgment: Judgment normal.     Comments: Insight intact Considerably more depressed.     Lab Review:     Component Value Date/Time   NA 139 05/25/2021 1501   K 4.7 05/25/2021 1501   CL 100 05/25/2021 1501   CO2 25 05/25/2021 1501   GLUCOSE 100 (H) 05/25/2021 1501   GLUCOSE 115 (H) 08/02/2016 1416   BUN 11 05/25/2021 1501   CREATININE 0.80 05/25/2021 1501   CALCIUM 9.3 05/25/2021 1501   PROT 6.9 09/22/2020 0927   ALBUMIN 4.5 09/22/2020 0927   AST 18 09/22/2020 0927   ALT 38 09/22/2020 0927   ALKPHOS 61 09/22/2020 0927   BILITOT <0.2 09/22/2020 0927   GFRNONAA >60 08/02/2016 1416   GFRAA >60 08/02/2016 1416       Component Value Date/Time   WBC 5.5 08/02/2016 1135   RBC 4.94 08/02/2016 1135   HGB 15.4 08/02/2016 1135   HCT 42.5 08/02/2016 1135   PLT 262 08/02/2016 1135   MCV 86.0 08/02/2016 1135   MCH 31.2 08/02/2016 1135   MCHC 36.2 (H) 08/02/2016 1135   RDW 12.3 08/02/2016 1135   LYMPHSABS 1.3 08/02/2016 1135   MONOABS 0.4 08/02/2016 1135   EOSABS 0.0 08/02/2016 1135   BASOSABS 0.0 08/02/2016 1135    Lithium Lvl  Date Value Ref Range Status  05/25/2021 0.5 0.5 - 1.2 mmol/L Final    Comment:    A  concentration of 0.5-0.8 mmol/L is advised  for long-term use; concentrations of up to 1.2 mmol/L may be necessary during acute treatment.                                  Detection Limit = 0.1                           <0.1 indicates None Detected      Lab Results  Component Value Date   VALPROATE 51 05/30/2019     .res Assessment: Plan:    Kashtyn was seen today for follow-up, depression, anxiety, sleeping problem, fatigue and add.  Diagnoses and all orders for this visit:  Bipolar disorder with severe depression (HCC)  Attention deficit hyperactivity disorder (ADHD), predominantly inattentive type  Generalized anxiety disorder  Obstructive sleep apnea hypopnea, severe  Insomnia due to mental condition     30 min non face to face time with patient was spent on counseling and coordination of care. 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  Multiple medication failures. At this time generalized anxiety  managed. Depressionworse off Vraylar. No opiates for several mos.  Better energy.   But still mood cycles with fatigue.  In last 6 weeks about a week of normal but better than it was.    Insomnia is a chronic intermittent problem.  Better on gabapentin.  But still a problem with sleep hygiene part of the problem.  Too much time in bed.  Disc sleep hygiene.    Discussed potential benefits, risks, and side effects of stimulants with patient to include increased heart rate, palpitations, insomnia, increased anxiety, increased irritability, or decreased appetite.  Instructed patient to contact office if experiencing any significant tolerability issues.  OSA severe on CPAP as of early June 2024 Consider Sunosi for fatigue and sleepiness to further help function.    Option clozapine, or Seroquel, Option ECT.  Disc in detail.  Option selegiline DT excessive sleepiness and fatigue.  Retry pramipexole (highest was 1.5 mg daily) but hx restlessness with it.  Cont  Adderall XR 30 AM and IR 15 mg PM   Stopped Seroquel 25-50 mg HS   Continue Depakote ER 1500 mg HS.  Consider incr for swings.  Check level.   Continue lithium CR 450 Mg HS  Gabapentin 900 mg pm for sleep and anxiety and some pain benefit  For focus and alertness Sunosi 150 mg AM  DC Ambien and no further RX for it.  Rec counseling.  Last was a little and was online in 2022.  Disc this again.    Check sodium to ro recurrent hyponatremia.   Resume Vraylar 1.5 mg bc worse off it.  Extensive discussion about pursuing ECT for TR depression.  He agrees.   Follow-up 1 month  Discussed side effects of each medication.  Meredith Staggers MD, DFAPA   Please see After Visit Summary for patient specific instructions.  No future appointments.     No orders of the defined types were placed in this encounter.   -------------------------------bi

## 2023-02-15 DIAGNOSIS — G4733 Obstructive sleep apnea (adult) (pediatric): Secondary | ICD-10-CM | POA: Diagnosis not present

## 2023-02-16 ENCOUNTER — Telehealth: Payer: Self-pay | Admitting: Psychiatry

## 2023-02-16 NOTE — Telephone Encounter (Signed)
Referral sent to [facility] for ECT

## 2023-02-17 ENCOUNTER — Other Ambulatory Visit: Payer: Self-pay

## 2023-02-17 ENCOUNTER — Other Ambulatory Visit (HOSPITAL_COMMUNITY): Payer: Self-pay

## 2023-02-17 ENCOUNTER — Other Ambulatory Visit: Payer: Self-pay | Admitting: Psychiatry

## 2023-02-17 ENCOUNTER — Telehealth: Payer: Commercial Managed Care - PPO | Admitting: Psychiatry

## 2023-02-17 DIAGNOSIS — F900 Attention-deficit hyperactivity disorder, predominantly inattentive type: Secondary | ICD-10-CM

## 2023-02-17 DIAGNOSIS — F411 Generalized anxiety disorder: Secondary | ICD-10-CM

## 2023-02-17 MED ORDER — VITAMIN D (ERGOCALCIFEROL) 1.25 MG (50000 UNIT) PO CAPS
50000.0000 [IU] | ORAL_CAPSULE | ORAL | 1 refills | Status: AC
Start: 2023-02-17 — End: ?
  Filled 2023-02-17: qty 12, 84d supply, fill #0
  Filled 2023-06-19: qty 12, 84d supply, fill #1
  Filled 2023-11-07 – 2023-11-28 (×2): qty 6, 42d supply, fill #2

## 2023-02-17 MED ORDER — AMPHETAMINE-DEXTROAMPHET ER 30 MG PO CP24
30.0000 mg | ORAL_CAPSULE | Freq: Every day | ORAL | 0 refills | Status: DC
Start: 2023-02-17 — End: 2023-03-24
  Filled 2023-02-17 – 2023-02-20 (×2): qty 30, 30d supply, fill #0

## 2023-02-17 MED ORDER — GABAPENTIN 300 MG PO CAPS
300.0000 mg | ORAL_CAPSULE | Freq: Three times a day (TID) | ORAL | 0 refills | Status: DC
Start: 2023-02-17 — End: 2023-03-24
  Filled 2023-02-17 – 2023-02-20 (×2): qty 90, 30d supply, fill #0

## 2023-02-17 MED ORDER — AMPHETAMINE-DEXTROAMPHETAMINE 15 MG PO TABS
15.0000 mg | ORAL_TABLET | Freq: Every day | ORAL | 0 refills | Status: DC
Start: 2023-02-17 — End: 2023-03-24
  Filled 2023-02-17 – 2023-02-20 (×2): qty 30, 30d supply, fill #0

## 2023-02-17 MED ORDER — LAMOTRIGINE 100 MG PO TABS
100.0000 mg | ORAL_TABLET | Freq: Two times a day (BID) | ORAL | 0 refills | Status: DC
  Filled 2023-02-17: qty 180, 90d supply, fill #0

## 2023-02-18 DIAGNOSIS — S62340A Nondisplaced fracture of base of second metacarpal bone, right hand, initial encounter for closed fracture: Secondary | ICD-10-CM | POA: Diagnosis not present

## 2023-02-20 ENCOUNTER — Other Ambulatory Visit: Payer: Self-pay

## 2023-02-20 ENCOUNTER — Other Ambulatory Visit (HOSPITAL_COMMUNITY): Payer: Self-pay

## 2023-03-16 ENCOUNTER — Other Ambulatory Visit (HOSPITAL_COMMUNITY): Payer: Self-pay

## 2023-03-17 DIAGNOSIS — S62340A Nondisplaced fracture of base of second metacarpal bone, right hand, initial encounter for closed fracture: Secondary | ICD-10-CM | POA: Diagnosis not present

## 2023-03-18 DIAGNOSIS — G4733 Obstructive sleep apnea (adult) (pediatric): Secondary | ICD-10-CM | POA: Diagnosis not present

## 2023-03-21 DIAGNOSIS — G4733 Obstructive sleep apnea (adult) (pediatric): Secondary | ICD-10-CM | POA: Diagnosis not present

## 2023-03-21 DIAGNOSIS — Z79899 Other long term (current) drug therapy: Secondary | ICD-10-CM | POA: Diagnosis not present

## 2023-03-21 DIAGNOSIS — F32A Depression, unspecified: Secondary | ICD-10-CM | POA: Diagnosis not present

## 2023-03-21 DIAGNOSIS — F909 Attention-deficit hyperactivity disorder, unspecified type: Secondary | ICD-10-CM | POA: Diagnosis not present

## 2023-03-24 ENCOUNTER — Other Ambulatory Visit: Payer: Self-pay | Admitting: Psychiatry

## 2023-03-24 ENCOUNTER — Other Ambulatory Visit (HOSPITAL_COMMUNITY): Payer: Self-pay

## 2023-03-24 DIAGNOSIS — G4733 Obstructive sleep apnea (adult) (pediatric): Secondary | ICD-10-CM

## 2023-03-24 DIAGNOSIS — F411 Generalized anxiety disorder: Secondary | ICD-10-CM

## 2023-03-24 DIAGNOSIS — F900 Attention-deficit hyperactivity disorder, predominantly inattentive type: Secondary | ICD-10-CM

## 2023-03-24 MED ORDER — AMPHETAMINE-DEXTROAMPHETAMINE 15 MG PO TABS
15.0000 mg | ORAL_TABLET | Freq: Every day | ORAL | 0 refills | Status: DC
  Filled 2023-03-24: qty 30, 30d supply, fill #0

## 2023-03-24 MED ORDER — SUNOSI 150 MG PO TABS
150.0000 mg | ORAL_TABLET | Freq: Every morning | ORAL | 1 refills | Status: DC
  Filled 2023-03-24: qty 30, 30d supply, fill #0

## 2023-03-24 MED ORDER — AMPHETAMINE-DEXTROAMPHET ER 30 MG PO CP24
30.0000 mg | ORAL_CAPSULE | Freq: Every day | ORAL | 0 refills | Status: DC
  Filled 2023-03-24: qty 30, 30d supply, fill #0

## 2023-03-24 MED ORDER — GABAPENTIN 300 MG PO CAPS
300.0000 mg | ORAL_CAPSULE | Freq: Three times a day (TID) | ORAL | 0 refills | Status: DC
  Filled 2023-03-24: qty 90, 30d supply, fill #0

## 2023-03-29 DIAGNOSIS — F319 Bipolar disorder, unspecified: Secondary | ICD-10-CM | POA: Diagnosis not present

## 2023-03-31 ENCOUNTER — Other Ambulatory Visit: Payer: Self-pay | Admitting: Psychiatry

## 2023-03-31 ENCOUNTER — Other Ambulatory Visit (HOSPITAL_COMMUNITY): Payer: Self-pay

## 2023-03-31 ENCOUNTER — Other Ambulatory Visit (HOSPITAL_BASED_OUTPATIENT_CLINIC_OR_DEPARTMENT_OTHER): Payer: Self-pay

## 2023-03-31 DIAGNOSIS — F319 Bipolar disorder, unspecified: Secondary | ICD-10-CM | POA: Diagnosis not present

## 2023-03-31 DIAGNOSIS — F3130 Bipolar disorder, current episode depressed, mild or moderate severity, unspecified: Secondary | ICD-10-CM | POA: Diagnosis not present

## 2023-03-31 MED ORDER — LORAZEPAM 1 MG PO TABS
1.0000 mg | ORAL_TABLET | Freq: Three times a day (TID) | ORAL | 0 refills | Status: DC
  Filled 2023-03-31: qty 30, 10d supply, fill #0

## 2023-03-31 NOTE — Progress Notes (Signed)
Note Pt started ECT [facility] on 1/15 and 03/31/23.  Having WD sx from stopping lamotrigine and Depakote so quickly.  Symptoms include irritability and excitability but no seizures.  Reportedly there was some difficulty achieving an adequate seizure on 03/29/2023.  But we also need to be mindful of his withdrawal symptoms.  Therefore, after reviewing the [facility] note it appears that he can take lamotrigine 100 mg in the morning every day except the day of the procedure so he is encouraged to do so.  We will also prescribe a few lorazepam which has a short half-life and should not interfere with ECT as long as he does not take it the night before or the morning of the procedure.  This should help with the withdrawal symptoms.  This will be a temporary measure until he finishes ECT or until the withdrawal symptoms resolve.  This plan was discussed with his wife with the patient's consent.  Meredith Staggers MD, DFAPA

## 2023-04-05 ENCOUNTER — Other Ambulatory Visit: Payer: Self-pay | Admitting: Psychiatry

## 2023-04-05 DIAGNOSIS — G4730 Sleep apnea, unspecified: Secondary | ICD-10-CM | POA: Diagnosis not present

## 2023-04-05 DIAGNOSIS — F319 Bipolar disorder, unspecified: Secondary | ICD-10-CM | POA: Diagnosis not present

## 2023-04-06 DIAGNOSIS — M25511 Pain in right shoulder: Secondary | ICD-10-CM | POA: Diagnosis not present

## 2023-04-07 DIAGNOSIS — F319 Bipolar disorder, unspecified: Secondary | ICD-10-CM | POA: Diagnosis not present

## 2023-04-08 DIAGNOSIS — S62305A Unspecified fracture of fourth metacarpal bone, left hand, initial encounter for closed fracture: Secondary | ICD-10-CM | POA: Diagnosis not present

## 2023-04-08 DIAGNOSIS — S62307A Unspecified fracture of fifth metacarpal bone, left hand, initial encounter for closed fracture: Secondary | ICD-10-CM | POA: Diagnosis not present

## 2023-04-10 DIAGNOSIS — F314 Bipolar disorder, current episode depressed, severe, without psychotic features: Secondary | ICD-10-CM | POA: Diagnosis not present

## 2023-04-10 DIAGNOSIS — F319 Bipolar disorder, unspecified: Secondary | ICD-10-CM | POA: Diagnosis not present

## 2023-04-10 DIAGNOSIS — G4733 Obstructive sleep apnea (adult) (pediatric): Secondary | ICD-10-CM | POA: Diagnosis not present

## 2023-04-11 ENCOUNTER — Other Ambulatory Visit (HOSPITAL_COMMUNITY): Payer: Self-pay

## 2023-04-11 DIAGNOSIS — S62337A Displaced fracture of neck of fifth metacarpal bone, left hand, initial encounter for closed fracture: Secondary | ICD-10-CM | POA: Diagnosis not present

## 2023-04-11 DIAGNOSIS — S62340A Nondisplaced fracture of base of second metacarpal bone, right hand, initial encounter for closed fracture: Secondary | ICD-10-CM | POA: Diagnosis not present

## 2023-04-11 DIAGNOSIS — S62315A Displaced fracture of base of fourth metacarpal bone, left hand, initial encounter for closed fracture: Secondary | ICD-10-CM | POA: Diagnosis not present

## 2023-04-11 MED ORDER — TRAMADOL HCL 50 MG PO TABS
50.0000 mg | ORAL_TABLET | Freq: Four times a day (QID) | ORAL | 0 refills | Status: DC
  Filled 2023-04-11: qty 28, 7d supply, fill #0

## 2023-04-12 DIAGNOSIS — F319 Bipolar disorder, unspecified: Secondary | ICD-10-CM | POA: Diagnosis not present

## 2023-04-13 ENCOUNTER — Other Ambulatory Visit: Payer: Self-pay | Admitting: Psychiatry

## 2023-04-13 ENCOUNTER — Other Ambulatory Visit (HOSPITAL_COMMUNITY): Payer: Self-pay

## 2023-04-13 ENCOUNTER — Other Ambulatory Visit: Payer: Self-pay

## 2023-04-13 DIAGNOSIS — F900 Attention-deficit hyperactivity disorder, predominantly inattentive type: Secondary | ICD-10-CM

## 2023-04-13 DIAGNOSIS — G4733 Obstructive sleep apnea (adult) (pediatric): Secondary | ICD-10-CM

## 2023-04-13 DIAGNOSIS — F411 Generalized anxiety disorder: Secondary | ICD-10-CM

## 2023-04-13 MED ORDER — GABAPENTIN 300 MG PO CAPS
300.0000 mg | ORAL_CAPSULE | Freq: Four times a day (QID) | ORAL | 0 refills | Status: DC
  Filled 2023-04-13 (×2): qty 120, 30d supply, fill #0

## 2023-04-13 MED ORDER — QUETIAPINE FUMARATE 100 MG PO TABS
100.0000 mg | ORAL_TABLET | Freq: Four times a day (QID) | ORAL | 0 refills | Status: DC
  Filled 2023-04-13: qty 120, 30d supply, fill #0

## 2023-04-14 ENCOUNTER — Other Ambulatory Visit (HOSPITAL_COMMUNITY): Payer: Self-pay

## 2023-04-14 DIAGNOSIS — F319 Bipolar disorder, unspecified: Secondary | ICD-10-CM | POA: Diagnosis not present

## 2023-04-17 DIAGNOSIS — F319 Bipolar disorder, unspecified: Secondary | ICD-10-CM | POA: Diagnosis not present

## 2023-04-18 DIAGNOSIS — G4733 Obstructive sleep apnea (adult) (pediatric): Secondary | ICD-10-CM | POA: Diagnosis not present

## 2023-04-21 ENCOUNTER — Other Ambulatory Visit: Payer: Self-pay | Admitting: Psychiatry

## 2023-04-21 ENCOUNTER — Other Ambulatory Visit (HOSPITAL_COMMUNITY): Payer: Self-pay

## 2023-04-21 DIAGNOSIS — F319 Bipolar disorder, unspecified: Secondary | ICD-10-CM | POA: Diagnosis not present

## 2023-04-21 MED ORDER — LORAZEPAM 1 MG PO TABS
1.0000 mg | ORAL_TABLET | Freq: Three times a day (TID) | ORAL | 0 refills | Status: DC
  Filled 2023-04-21: qty 30, 10d supply, fill #0

## 2023-04-24 DIAGNOSIS — F319 Bipolar disorder, unspecified: Secondary | ICD-10-CM | POA: Diagnosis not present

## 2023-04-25 ENCOUNTER — Other Ambulatory Visit (HOSPITAL_COMMUNITY): Payer: Self-pay

## 2023-04-25 ENCOUNTER — Ambulatory Visit (HOSPITAL_COMMUNITY)
Admission: RE | Admit: 2023-04-25 | Discharge: 2023-04-25 | Disposition: A | Discharge status: Home / Self Care | Payer: Commercial Managed Care - PPO | Source: Ambulatory Visit | Attending: Medical | Admitting: Medical

## 2023-04-25 ENCOUNTER — Other Ambulatory Visit (HOSPITAL_COMMUNITY): Payer: Self-pay | Admitting: Medical

## 2023-04-25 ENCOUNTER — Other Ambulatory Visit: Payer: Self-pay | Admitting: Psychiatry

## 2023-04-25 DIAGNOSIS — M25411 Effusion, right shoulder: Secondary | ICD-10-CM | POA: Diagnosis not present

## 2023-04-25 DIAGNOSIS — M25511 Pain in right shoulder: Secondary | ICD-10-CM | POA: Insufficient documentation

## 2023-04-25 DIAGNOSIS — S46811A Strain of other muscles, fascia and tendons at shoulder and upper arm level, right arm, initial encounter: Secondary | ICD-10-CM | POA: Diagnosis not present

## 2023-04-25 DIAGNOSIS — M75121 Complete rotator cuff tear or rupture of right shoulder, not specified as traumatic: Secondary | ICD-10-CM | POA: Diagnosis not present

## 2023-04-25 DIAGNOSIS — M948X1 Other specified disorders of cartilage, shoulder: Secondary | ICD-10-CM | POA: Diagnosis not present

## 2023-04-26 ENCOUNTER — Other Ambulatory Visit (HOSPITAL_COMMUNITY): Payer: Self-pay

## 2023-04-27 ENCOUNTER — Other Ambulatory Visit (HOSPITAL_COMMUNITY): Payer: Commercial Managed Care - PPO

## 2023-04-27 ENCOUNTER — Other Ambulatory Visit (HOSPITAL_COMMUNITY): Payer: Self-pay

## 2023-04-27 ENCOUNTER — Other Ambulatory Visit (HOSPITAL_COMMUNITY): Payer: Self-pay | Attending: Medical Genetics

## 2023-04-27 MED ORDER — TRAMADOL HCL 50 MG PO TABS
50.0000 mg | ORAL_TABLET | Freq: Four times a day (QID) | ORAL | 0 refills | Status: DC
  Filled 2023-04-27: qty 28, 7d supply, fill #0

## 2023-05-02 ENCOUNTER — Other Ambulatory Visit (HOSPITAL_COMMUNITY): Payer: Self-pay

## 2023-05-02 ENCOUNTER — Telehealth: Payer: Self-pay | Admitting: Psychiatry

## 2023-05-02 DIAGNOSIS — F314 Bipolar disorder, current episode depressed, severe, without psychotic features: Secondary | ICD-10-CM

## 2023-05-02 MED ORDER — QUETIAPINE FUMARATE 100 MG PO TABS
100.0000 mg | ORAL_TABLET | Freq: Every day | ORAL | Status: DC
Start: 2023-05-02 — End: 2023-06-05

## 2023-05-02 MED ORDER — RISPERIDONE 2 MG PO TABS
1.0000 mg | ORAL_TABLET | Freq: Two times a day (BID) | ORAL | 0 refills | Status: DC | PRN
  Filled 2023-05-02: qty 60, 30d supply, fill #0

## 2023-05-02 NOTE — Telephone Encounter (Signed)
There have been several things that have created somewhat of a perfect storm that have kept him from engaging in regular ECT.  They have had some difficulty at [facility] triggering a sufficient seizure but that seems to have been solved as of the last couple of procedures.  So he has not seen benefit.  However his mother is critically ill and in the hospital.  It has overwhelmed him making it difficult for him to go see her but also difficult to get to [facility] for the ECT.  He has missed the last couple of ECT tx. His wife reports he has been very agitated and anxious since being off the Depakote and lamotrigine.  Has been necessary for him to stop those medications in order to have ECT.  The increase in Seroquel to 100 mg 4 times daily has not been sufficient.  He is taking lorazepam which has not been sufficiently calming.  The next best option would be risperidone 1 to 2 mg twice daily as needed severe anxiety or agitation.  We discussed the risk of TD.  He will reduce the Seroquel back to the lowest dose necessary to help him sleep at night and only take it at night not during the day. He's willing to take the risperidone.  Meredith Staggers MD, DFAPA

## 2023-05-03 ENCOUNTER — Other Ambulatory Visit (HOSPITAL_COMMUNITY): Payer: Self-pay

## 2023-05-04 ENCOUNTER — Other Ambulatory Visit (HOSPITAL_COMMUNITY): Payer: Self-pay

## 2023-05-04 DIAGNOSIS — M75121 Complete rotator cuff tear or rupture of right shoulder, not specified as traumatic: Secondary | ICD-10-CM | POA: Diagnosis not present

## 2023-05-04 MED ORDER — HYDROCODONE-ACETAMINOPHEN 5-325 MG PO TABS
1.0000 | ORAL_TABLET | Freq: Three times a day (TID) | ORAL | 0 refills | Status: DC | PRN
  Filled 2023-05-04: qty 6, 2d supply, fill #0

## 2023-05-05 DIAGNOSIS — S62315A Displaced fracture of base of fourth metacarpal bone, left hand, initial encounter for closed fracture: Secondary | ICD-10-CM | POA: Diagnosis not present

## 2023-05-05 DIAGNOSIS — S62337A Displaced fracture of neck of fifth metacarpal bone, left hand, initial encounter for closed fracture: Secondary | ICD-10-CM | POA: Diagnosis not present

## 2023-05-08 DIAGNOSIS — F319 Bipolar disorder, unspecified: Secondary | ICD-10-CM | POA: Diagnosis not present

## 2023-05-14 ENCOUNTER — Telehealth: Payer: Self-pay

## 2023-05-14 NOTE — Telephone Encounter (Signed)
 Prior Authorization renewal submitted for Sunosi 150 mg with [employer], approval received effective 05/14/23-05/13/24

## 2023-05-16 ENCOUNTER — Other Ambulatory Visit: Payer: Self-pay | Admitting: Psychiatry

## 2023-05-16 MED ORDER — BENZTROPINE MESYLATE 1 MG PO TABS: 1.0000 mg | ORAL_TABLET | Freq: Three times a day (TID) | ORAL | 0 refills | Status: DC | PRN

## 2023-05-17 DIAGNOSIS — F319 Bipolar disorder, unspecified: Secondary | ICD-10-CM | POA: Diagnosis not present

## 2023-05-18 ENCOUNTER — Other Ambulatory Visit (HOSPITAL_COMMUNITY): Payer: Self-pay

## 2023-05-18 ENCOUNTER — Other Ambulatory Visit: Payer: Self-pay | Admitting: Psychiatry

## 2023-05-18 DIAGNOSIS — G4733 Obstructive sleep apnea (adult) (pediatric): Secondary | ICD-10-CM | POA: Diagnosis not present

## 2023-05-18 MED ORDER — LORAZEPAM 1 MG PO TABS
1.0000 mg | ORAL_TABLET | Freq: Three times a day (TID) | ORAL | 0 refills | Status: DC
  Filled 2023-05-18: qty 30, 10d supply, fill #0

## 2023-05-19 ENCOUNTER — Other Ambulatory Visit (HOSPITAL_COMMUNITY): Payer: Self-pay

## 2023-05-19 ENCOUNTER — Telehealth: Payer: Self-pay | Admitting: Psychiatry

## 2023-05-19 ENCOUNTER — Telehealth: Admitting: Psychiatry

## 2023-05-19 ENCOUNTER — Other Ambulatory Visit: Payer: Self-pay

## 2023-05-19 DIAGNOSIS — F5105 Insomnia due to other mental disorder: Secondary | ICD-10-CM

## 2023-05-19 DIAGNOSIS — F314 Bipolar disorder, current episode depressed, severe, without psychotic features: Secondary | ICD-10-CM

## 2023-05-19 DIAGNOSIS — G2571 Drug induced akathisia: Secondary | ICD-10-CM

## 2023-05-19 DIAGNOSIS — F411 Generalized anxiety disorder: Secondary | ICD-10-CM

## 2023-05-19 DIAGNOSIS — F319 Bipolar disorder, unspecified: Secondary | ICD-10-CM | POA: Diagnosis not present

## 2023-05-19 DIAGNOSIS — F3130 Bipolar disorder, current episode depressed, mild or moderate severity, unspecified: Secondary | ICD-10-CM | POA: Diagnosis not present

## 2023-05-19 MED ORDER — GABAPENTIN 300 MG PO CAPS
300.0000 mg | ORAL_CAPSULE | Freq: Four times a day (QID) | ORAL | 0 refills | Status: DC
  Filled 2023-05-19: qty 120, 30d supply, fill #0

## 2023-05-19 MED ORDER — LORAZEPAM 1 MG PO TABS
ORAL_TABLET | ORAL | 0 refills | Status: DC
  Filled 2023-05-19: qty 60, 20d supply, fill #0

## 2023-05-19 MED ORDER — PROPRANOLOL HCL 60 MG PO TABS
60.0000 mg | ORAL_TABLET | Freq: Three times a day (TID) | ORAL | 0 refills | Status: DC
  Filled 2023-05-19: qty 90, 30d supply, fill #0
  Filled 2023-05-19: qty 80, 27d supply, fill #0
  Filled 2023-05-19: qty 10, 3d supply, fill #0

## 2023-05-19 NOTE — Telephone Encounter (Signed)
 Extensive discussion especially with wife with patient's input. Pt at home, wife in office, MD in office 30 mins:  He had ECT this morning with an apparent adequate seizure.  This was his 13th treatment.  However at least 5 treatments did not produce an adequate seizure.  Due to illness and his mother's hospitalization there were several mist procedures so the remaining 8 or so procedures that occurred were not consecutive as would be ideal to produce the best outcome.  In the beginning it did seem the ECT was helpful with mood.  He however has had a great deal of distress and agitation since stopping the Depakote and lamotrigine and gabapentin in order to have the ECT.  Vraylar was not adequate to control this.  Seroquel 100 mg 4 times daily was not adequate to control this.  When risperidone 2 mg twice daily was added to try to manage the agitation and to provide mood stabilization he had a great deal of akathisia.  It is unclear whether he was continuing the Vraylar at the same time but if so that was also contributing to the akathisia.  He had akathisia at higher doses than 1.5 mg daily of Vraylar. Propranolol 60 mg 3 times daily with Cogentin 1 mg twice daily so far has been tolerable and has reduced the akathisia to a manageable level.  We discussed the pros and cons of discontinuing ECT.  Apparently today Shawn Cervantes at [facility] informed Shawn Cervantes that they have continued to have a great deal of difficulty administering enough anesthesia for the ECT.  In addition it has been difficult to control this hypertensive reaction to the ECT and Shawn Cervantes is wanting to postpone any further ECT at this time. In discussion with wife Shawn Cervantes and Shawn Cervantes however, given limited alternatives and the statistical advantages of ECT over other options,  they would like to finish the course of ECT rather than stop and have to resume it at some later point.  This is particularly true because if he stops ECT  we will need to resume Depakote and lamotrigine and gabapentin because those have provided the best mood stabilization of all the options that he has taken.  He had such difficulty coming off these medications with withdrawal prior to ECT he does not want to have to go through that again.  Therefore,  I will attempt to reach Shawn Cervantes and a phone call was placed today in order to encourage continued ECT rather than putting it on hold.  Plan:  In the interim, he is having a great deal of difficulty sleeping and Seroquel causes night eating therefore we will increase Ativan to 2 mg nightly and 1 mg twice daily as needed for agitation.  He is encouraged to take as little as possible because he will have to discontinue it prior to resuming ECT. Propranolol 60 mg 3 times daily for akathisia Cogentin 1 mg twice daily for akathisia Hold Adderall Hold Vraylar because of akathisia though this had been the most effective depression med that he has taken.  We will likely resume it after ECT is complete at which point we will also stop risperidone. In the interim we will continue risperidone 3 mg daily for mood stabilization and agitation. DC seroquel DT poor response for sleep and agitation and SE night eating. Continue gabapentin 300 mg 4 times daily for back pain and anxiety and off-label for mood stabilization.  This will also have to be stopped prior to  resuming ECT.  We discussed alternative medications for depression in case ECT is not resumed.  The 2 best options are Vraylar 1.5 mg daily which had provided some benefit which was significant but not sufficient or a trial of clozapine.  Given his difficulty with overeating there is some concern about using clozapine but it should be considered given its overall effectiveness.  His wife is in agreement with the plan.  Patient is still groggy after ECT and the plan will be discussed with him again after his ECT grogginess resolves.  Shawn Staggers, MD,  DFAPA

## 2023-05-22 ENCOUNTER — Other Ambulatory Visit (HOSPITAL_COMMUNITY): Payer: Self-pay

## 2023-05-22 ENCOUNTER — Other Ambulatory Visit: Payer: Self-pay

## 2023-05-22 MED ORDER — HYDROCODONE-ACETAMINOPHEN 5-325 MG PO TABS
1.0000 | ORAL_TABLET | Freq: Three times a day (TID) | ORAL | 0 refills | Status: DC
  Filled 2023-05-22: qty 20, 7d supply, fill #0

## 2023-05-30 ENCOUNTER — Other Ambulatory Visit (HOSPITAL_COMMUNITY): Payer: Self-pay

## 2023-05-30 DIAGNOSIS — F909 Attention-deficit hyperactivity disorder, unspecified type: Secondary | ICD-10-CM | POA: Diagnosis not present

## 2023-05-30 DIAGNOSIS — F39 Unspecified mood [affective] disorder: Secondary | ICD-10-CM | POA: Diagnosis not present

## 2023-05-31 ENCOUNTER — Other Ambulatory Visit: Payer: Self-pay | Admitting: Psychiatry

## 2023-05-31 MED ORDER — CLONIDINE HCL 0.1 MG PO TABS: 0.1000 mg | ORAL_TABLET | Freq: Two times a day (BID) | ORAL | 0 refills | Status: DC

## 2023-05-31 NOTE — Progress Notes (Signed)
 Disc with wife: Trial clonidine for BP, anxiety, off label for mood stabilizer should make managing BP easier during ECT bc was told by PCP needed to start BP med but he didn't. Meredith Staggers, MD, DFAPA

## 2023-06-02 ENCOUNTER — Other Ambulatory Visit (HOSPITAL_COMMUNITY): Payer: Self-pay

## 2023-06-02 DIAGNOSIS — M67911 Unspecified disorder of synovium and tendon, right shoulder: Secondary | ICD-10-CM | POA: Diagnosis not present

## 2023-06-05 ENCOUNTER — Other Ambulatory Visit: Payer: Self-pay

## 2023-06-05 DIAGNOSIS — F314 Bipolar disorder, current episode depressed, severe, without psychotic features: Secondary | ICD-10-CM

## 2023-06-05 DIAGNOSIS — F319 Bipolar disorder, unspecified: Secondary | ICD-10-CM | POA: Diagnosis not present

## 2023-06-05 DIAGNOSIS — F900 Attention-deficit hyperactivity disorder, predominantly inattentive type: Secondary | ICD-10-CM

## 2023-06-05 MED ORDER — QUETIAPINE FUMARATE 100 MG PO TABS
100.0000 mg | ORAL_TABLET | Freq: Every day | ORAL | 0 refills | Status: DC
  Filled 2023-06-05: qty 90, 90d supply, fill #0

## 2023-06-05 MED ORDER — AMPHETAMINE-DEXTROAMPHET ER 30 MG PO CP24
30.0000 mg | ORAL_CAPSULE | Freq: Every day | ORAL | 0 refills | Status: DC
  Filled 2023-06-05: qty 30, 30d supply, fill #0

## 2023-06-06 ENCOUNTER — Other Ambulatory Visit: Payer: Self-pay | Admitting: Orthopaedic Surgery

## 2023-06-06 ENCOUNTER — Other Ambulatory Visit: Payer: Self-pay

## 2023-06-06 ENCOUNTER — Other Ambulatory Visit (HOSPITAL_COMMUNITY): Payer: Self-pay

## 2023-06-07 DIAGNOSIS — F319 Bipolar disorder, unspecified: Secondary | ICD-10-CM | POA: Diagnosis not present

## 2023-06-08 ENCOUNTER — Other Ambulatory Visit (HOSPITAL_COMMUNITY): Payer: Self-pay

## 2023-06-09 DIAGNOSIS — F319 Bipolar disorder, unspecified: Secondary | ICD-10-CM | POA: Diagnosis not present

## 2023-06-12 DIAGNOSIS — F319 Bipolar disorder, unspecified: Secondary | ICD-10-CM | POA: Diagnosis not present

## 2023-06-14 ENCOUNTER — Other Ambulatory Visit: Payer: Self-pay | Admitting: Psychiatry

## 2023-06-14 DIAGNOSIS — F314 Bipolar disorder, current episode depressed, severe, without psychotic features: Secondary | ICD-10-CM

## 2023-06-14 DIAGNOSIS — Z79899 Other long term (current) drug therapy: Secondary | ICD-10-CM

## 2023-06-15 ENCOUNTER — Encounter (HOSPITAL_COMMUNITY): Payer: Self-pay

## 2023-06-15 NOTE — Patient Instructions (Signed)
 SURGICAL WAITING ROOM VISITATION  Patients having surgery or a procedure may have no more than 2 support people in the waiting area - these visitors may rotate.    Children under the age of 25 must have an adult with them who is not the patient.  Due to an increase in RSV and influenza rates and associated hospitalizations, children ages 28 and under may not visit patients in [facility] hospitals.  Visitors with respiratory illnesses are discouraged from visiting and should remain at home.  If the patient needs to stay at the hospital during part of their recovery, the visitor guidelines for inpatient rooms apply. Pre-op nurse will coordinate an appropriate time for 1 support person to accompany patient in pre-op.  This support person may not rotate.    Please refer to the [facility] website for the visitor guidelines for Inpatients (after your surgery is over and you are in a regular room).       Your procedure is scheduled on: 06-20-23   Report to [facility] Main Entrance    Report to admitting at       0735 AM   Call this number if you have problems the morning of surgery [phone]   Do not eat food :After Midnight.   After Midnight you may have the following liquids until   0650 ______ AM/  DAY OF SURGERY  then nothing by mouth  Water Non-Citrus Juices (without pulp, NO RED-Apple, White grape, White cranberry) Black Coffee (NO MILK/CREAM OR CREAMERS, sugar ok)  Clear Tea (NO MILK/CREAM OR CREAMERS, sugar ok) regular and decaf                             Plain Jell-O (NO RED)                                           Fruit ices (not with fruit pulp, NO RED)                                     Popsicles (NO RED)                                                               Sports drinks like Gatorade (NO RED)                   The day of surgery:  Drink ONE (1) Pre-Surgery Clear Ensure  BY 0650 AM the morning of surgery. Drink in one sitting. Do not sip.  This  drink was given to you during your hospital  pre-op appointment visit. Nothing else to drink after completing the  Pre-Surgery Clear Ensure .          If you have questions, please contact your surgeon's office.   FOLLOW  ANY ADDITIONAL PRE OP INSTRUCTIONS YOU RECEIVED FROM YOUR SURGEON'S OFFICE!!!     Oral Hygiene is also important to reduce your risk of infection.  Remember - BRUSH YOUR TEETH THE MORNING OF SURGERY WITH YOUR REGULAR TOOTHPASTE  DENTURES WILL BE REMOVED PRIOR TO SURGERY PLEASE DO NOT APPLY "Poly grip" OR ADHESIVES!!!   Do NOT smoke after Midnight   Stop all vitamins and herbal supplements 7 days before surgery.   Take these medicines the morning of surgery with A SIP OF WATER: (IF needed Tylenol or Tramadol, ativan, risperdal,) , Take propranolol, gabapentin, clonidine, Cogentin     Bring CPAP mask and tubing day of surgery.                              You may not have any metal on your body including hair pins, jewelry, and body piercing             Do not wear  lotions, powders, cologne, or deodorant                Men may shave face and neck.   Do not bring valuables to the hospital. [facility] IS NOT             RESPONSIBLE   FOR VALUABLES.   Contacts, glasses, dentures or bridgework may not be worn into surgery.   Bring small overnight bag day of surgery.   DO NOT BRING YOUR HOME MEDICATIONS TO THE HOSPITAL. PHARMACY WILL DISPENSE MEDICATIONS LISTED ON YOUR MEDICATION LIST TO YOU DURING YOUR ADMISSION IN THE HOSPITAL!    Patients discharged on the day of surgery will not be allowed to drive home.  Someone NEEDS to stay with you for the first 24 hours after anesthesia.   Special Instructions: Bring a copy of your healthcare power of attorney and living will documents the day of surgery if you haven't scanned them before.              Please read over the following fact sheets you were given: IF YOU HAVE  QUESTIONS ABOUT YOUR PRE-OP INSTRUCTIONS PLEASE CALL [phone]    If you test positive for Covid or have been in contact with anyone that has tested positive in the last 10 days please notify you surgeon.    [place] - Preparing for Surgery Before surgery, you can play an important role.  Because skin is not sterile, your skin needs to be as free of germs as possible.  You can reduce the number of germs on your skin by washing with CHG (chlorahexidine gluconate) soap before surgery.  CHG is an antiseptic cleaner which kills germs and bonds with the skin to continue killing germs even after washing. Please DO NOT use if you have an allergy to CHG or antibacterial soaps.  If your skin becomes reddened/irritated stop using the CHG and inform your nurse when you arrive at Short Stay. Do not shave (including legs and underarms) for at least 48 hours prior to the first CHG shower.  You may shave your face/neck. Please follow these instructions carefully:  1.  Shower with CHG Soap the night before surgery and the  morning of Surgery.  2.  If you choose to wash your hair, wash your hair first as usual with your  normal  shampoo.  3.  After you shampoo, rinse your hair and body thoroughly to remove the  shampoo.                           4.  Use CHG  as you would any other liquid soap.  You can apply chg directly  to the skin and wash                       Gently with a scrungie or clean washcloth.  5.  Apply the CHG Soap to your body ONLY FROM THE NECK DOWN.   Do not use on face/ open                           Wound or open sores. Avoid contact with eyes, ears mouth and genitals (private parts).                       Wash face,  Genitals (private parts) with your normal soap.             6.  Wash thoroughly, paying special attention to the area where your surgery  will be performed.  7.  Thoroughly rinse your body with warm water from the neck down.  8.  DO NOT shower/wash with your normal soap after  using and rinsing off  the CHG Soap.                9.  Pat yourself dry with a clean towel.            10.  Wear clean pajamas.            11.  Place clean sheets on your bed the night of your first shower and do not  sleep with pets. Day of Surgery : Do not apply any lotions/deodorants the morning of surgery.  Please wear clean clothes to the hospital/surgery center.  FAILURE TO FOLLOW THESE INSTRUCTIONS MAY RESULT IN THE CANCELLATION OF YOUR SURGERY PATIENT SIGNATURE_________________________________  NURSE SIGNATURE__________________________________  ________________________________________________________________________  Rogelia Mire  An incentive spirometer is a tool that can help keep your lungs clear and active. This tool measures how well you are filling your lungs with each breath. Taking long deep breaths may help reverse or decrease the chance of developing breathing (pulmonary) problems (especially infection) following: A long period of time when you are unable to move or be active. BEFORE THE PROCEDURE  If the spirometer includes an indicator to show your best effort, your nurse or respiratory therapist will set it to a desired goal. If possible, sit up straight or lean slightly forward. Try not to slouch. Hold the incentive spirometer in an upright position. INSTRUCTIONS FOR USE  Sit on the edge of your bed if possible, or sit up as far as you can in bed or on a chair. Hold the incentive spirometer in an upright position. Breathe out normally. Place the mouthpiece in your mouth and seal your lips tightly around it. Breathe in slowly and as deeply as possible, raising the piston or the ball toward the top of the column. Hold your breath for 3-5 seconds or for as long as possible. Allow the piston or ball to fall to the bottom of the column. Remove the mouthpiece from your mouth and breathe out normally. Rest for a few seconds and repeat Steps 1 through 7 at least  10 times every 1-2 hours when you are awake. Take your time and take a few normal breaths between deep breaths. The spirometer may include an indicator to show your best effort. Use the indicator as a goal to work toward during each repetition. After each  set of 10 deep breaths, practice coughing to be sure your lungs are clear. If you have an incision (the cut made at the time of surgery), support your incision when coughing by placing a pillow or rolled up towels firmly against it. Once you are able to get out of bed, walk around indoors and cough well. You may stop using the incentive spirometer when instructed by your caregiver.  RISKS AND COMPLICATIONS Take your time so you do not get dizzy or light-headed. If you are in pain, you may need to take or ask for pain medication before doing incentive spirometry. It is harder to take a deep breath if you are having pain. AFTER USE Rest and breathe slowly and easily. It can be helpful to keep track of a log of your progress. Your caregiver can provide you with a simple table to help with this. If you are using the spirometer at home, follow these instructions: SEEK MEDICAL CARE IF:  You are having difficultly using the spirometer. You have trouble using the spirometer as often as instructed. Your pain medication is not giving enough relief while using the spirometer. You develop fever of 100.5 F (38.1 C) or higher. SEEK IMMEDIATE MEDICAL CARE IF:  You cough up bloody sputum that had not been present before. You develop fever of 102 F (38.9 C) or greater. You develop worsening pain at or near the incision site. MAKE SURE YOU:  Understand these instructions. Will watch your condition. Will get help right away if you are not doing well or get worse. Document Released: 07/11/2006 Document Revised: 05/23/2011 Document Reviewed: 09/11/2006 [facility] Patient Information 2014 [place],  [place].   ________________________________________________________________________

## 2023-06-15 NOTE — Progress Notes (Signed)
 PCP - Tally Joe, MD [facility] - no  PPM/ICD -  Device Orders -  Rep Notified -   Chest x-ray -  EKG - 06-16-23 epic Stress Test -  ECHO -  Cardiac Cath -   Sleep Study -  CPAP -   Fasting Blood Sugar -  Checks Blood Sugar _____ times a day  Blood Thinner Instructions:n/a Aspirin Instructions:  ERAS Protcol - PRE-SURGERY Ensure     COVID vaccine -yes  Activity--Able to climb a flight of stairs with no CP or SOB Anesthesia review: HTN, OSA Cpap, Spinal cord stimulator instructed to bring remote  Patient denies shortness of breath, fever, cough and chest pain at PAT appointment   All instructions explained to the patient, with a verbal understanding of the material. Patient agrees to go over the instructions while at home for a better understanding. Patient also instructed to self quarantine after being tested for COVID-19. The opportunity to ask questions was provided.

## 2023-06-16 ENCOUNTER — Other Ambulatory Visit: Payer: Self-pay

## 2023-06-16 ENCOUNTER — Other Ambulatory Visit: Payer: Self-pay | Admitting: Psychiatry

## 2023-06-16 ENCOUNTER — Telehealth (INDEPENDENT_AMBULATORY_CARE_PROVIDER_SITE_OTHER): Admitting: Psychiatry

## 2023-06-16 ENCOUNTER — Encounter: Payer: Self-pay | Admitting: Psychiatry

## 2023-06-16 ENCOUNTER — Encounter (HOSPITAL_COMMUNITY): Payer: Self-pay

## 2023-06-16 ENCOUNTER — Other Ambulatory Visit (HOSPITAL_COMMUNITY): Payer: Self-pay

## 2023-06-16 ENCOUNTER — Encounter (HOSPITAL_COMMUNITY)
Admission: RE | Admit: 2023-06-16 | Discharge: 2023-06-16 | Disposition: A | Discharge status: Home / Self Care | Source: Ambulatory Visit | Attending: Orthopaedic Surgery | Admitting: Orthopaedic Surgery

## 2023-06-16 VITALS — BP 117/83 | HR 66 | Temp 98.0°F | Resp 16 | Ht 74.0 in | Wt 253.0 lb

## 2023-06-16 DIAGNOSIS — Z01810 Encounter for preprocedural cardiovascular examination: Secondary | ICD-10-CM | POA: Diagnosis present

## 2023-06-16 DIAGNOSIS — F314 Bipolar disorder, current episode depressed, severe, without psychotic features: Secondary | ICD-10-CM

## 2023-06-16 DIAGNOSIS — G4733 Obstructive sleep apnea (adult) (pediatric): Secondary | ICD-10-CM

## 2023-06-16 DIAGNOSIS — Z01818 Encounter for other preprocedural examination: Secondary | ICD-10-CM | POA: Insufficient documentation

## 2023-06-16 DIAGNOSIS — Z01812 Encounter for preprocedural laboratory examination: Secondary | ICD-10-CM | POA: Diagnosis present

## 2023-06-16 DIAGNOSIS — Z79899 Other long term (current) drug therapy: Secondary | ICD-10-CM

## 2023-06-16 DIAGNOSIS — F900 Attention-deficit hyperactivity disorder, predominantly inattentive type: Secondary | ICD-10-CM | POA: Diagnosis not present

## 2023-06-16 DIAGNOSIS — F411 Generalized anxiety disorder: Secondary | ICD-10-CM | POA: Diagnosis not present

## 2023-06-16 DIAGNOSIS — F5105 Insomnia due to other mental disorder: Secondary | ICD-10-CM

## 2023-06-16 DIAGNOSIS — I10 Essential (primary) hypertension: Secondary | ICD-10-CM | POA: Diagnosis not present

## 2023-06-16 HISTORY — DX: Headache, unspecified: R51.9

## 2023-06-16 HISTORY — DX: Essential (primary) hypertension: I10

## 2023-06-16 HISTORY — DX: Sleep apnea, unspecified: G47.30

## 2023-06-16 HISTORY — DX: Gastro-esophageal reflux disease without esophagitis: K21.9

## 2023-06-16 LAB — CBC
HCT: 50.7 % (ref 39.0–52.0)
Hemoglobin: 16.9 g/dL (ref 13.0–17.0)
MCH: 30.8 pg (ref 26.0–34.0)
MCHC: 33.3 g/dL (ref 30.0–36.0)
MCV: 92.5 fL (ref 80.0–100.0)
Platelets: 261 10*3/uL (ref 150–400)
RBC: 5.48 MIL/uL (ref 4.22–5.81)
RDW: 13.2 % (ref 11.5–15.5)
WBC: 8.2 10*3/uL (ref 4.0–10.5)
nRBC: 0 % (ref 0.0–0.2)

## 2023-06-16 LAB — BASIC METABOLIC PANEL WITH GFR
Anion gap: 9 (ref 5–15)
BUN: 7 mg/dL (ref 6–20)
CO2: 24 mmol/L (ref 22–32)
Calcium: 9.3 mg/dL (ref 8.9–10.3)
Chloride: 107 mmol/L (ref 98–111)
Creatinine, Ser: 1.07 mg/dL (ref 0.61–1.24)
GFR, Estimated: >60 mL/min (ref >60)
Glucose, Bld: 99 mg/dL (ref 70–99)
Potassium: 4.2 mmol/L (ref 3.5–5.1)
Sodium: 140 mmol/L (ref 135–145)

## 2023-06-16 MED ORDER — LORAZEPAM 1 MG PO TABS
ORAL_TABLET | ORAL | 0 refills | Status: DC
  Filled 2023-06-16: qty 100, 25d supply, fill #0

## 2023-06-16 MED ORDER — CLOZAPINE 25 MG PO TABS
ORAL_TABLET | ORAL | 0 refills | Status: DC
  Filled 2023-06-16: qty 15, fill #0
  Filled 2023-06-19 – 2023-06-20 (×2): qty 15, 4d supply, fill #0

## 2023-06-16 NOTE — Addendum Note (Signed)
 Addended by: Kirstie Peri on: 06/16/2023 05:09 PM   Modules accepted: Level of Service

## 2023-06-16 NOTE — Progress Notes (Addendum)
 Shawn Cervantes [id number] 1970-09-14 53 y.o.  Video Visit via My Chart  I connected with pt by video using My Chart and verified that I am speaking with the correct person using two identifiers.   I discussed the limitations, risks, security and privacy concerns of performing an evaluation and management service by My Chart  and the availability of in person appointments. I also discussed with the patient that there may be a patient responsible charge related to this service. The patient expressed understanding and agreed to proceed.  I discussed the assessment and treatment plan with the patient. The patient was provided an opportunity to ask questions and all were answered. The patient agreed with the plan and demonstrated an understanding of the instructions.   The patient was advised to call back or seek an in-person evaluation if the symptoms worsen or if the condition fails to improve as anticipated.  I provided 50 minutes of video time during this encounter.  The patient was located at home and the provider was located office. Session from 400-450  Subjective:   [name] is a 53 y.o. (DOB 11-28-1970) male.  Chief Complaint:  Chief Complaint  Patient presents with   Follow-up   Depression   Anxiety   Stress   Sleeping Problem    HPI Shawn Cervantes presents to the office today for follow-up of bipolar depression and panic disorder and ADD.  On 12/24/21 started Trintellix and up to 10 mg daily.  And changed Adderall to concerta 54 mg Am No sig effect from Concerta 54. Maybe a little loss of energy with switch. NoSE with it. NO SE with trintellix change. No change in energy.   F in law died.   Maybe more depressed than he's gotten used to being.  Hard to make himself go out.  Can't fake his way through it right now. Anxiety settled after adjusted to Trintellix but still more.  Anxiety about the same as when on Paxil.  Sleep pattern is same but if anything  more sleep than normal.  But some days EFA.  Harder to wake in the morning.   Mirtazapine plus Quiviq helps initial insomnia.  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 Increase Trintellix to 20 mg daily for treatment resistant bipolar depression  03/18/22 appt noted: Sleep better off Xtampza and on morphine ER 15 BID. No benefit or SE with Trintellix 20 mg daily. Anxiety is ok but not great.  Not the thing that's keeping me from moving DT depression and fatigue. Chronic apathy so why bother doing anything.  Will get anxious thinking about the downward spiral. Concerta 72 mg no better than Adderall.  Doesn't take it daily bc doesn't seem to help.  Stopped it for a couple of weeks.  Did take it consistently at 54 mg .  Didn't notice much from the 72 mg being different. Thinks he originally got benefit from Adderall. Has decided the pain meds make him tired and apathetic and wants to try to get off pain meds.   Sleep with Ambien occ. Plan: Concerta without help so return to Adderall.   Increase Trintellix to 30 mg daily for treatment resistant bipolar depression for 2 weeks.  If NR stop it.  This is off label. Continue mirtazapine 30 mg HS.   OK prn zolpidem  04/08/22 appt noted: Overused the  Ambien and out.     Took 30 mg Trintellix for a week or so and stopped it DT NR. Stopped pain pills for a couple of week and used Kratom to help with withdrawal.  Went to doctor yesterday and he got immediate release morphine 15 mg and got #120 pills. Withdrawal was better within a week or so.  Current meds:  Adderall XR 20 daily, flexeril 10 mg HS, Quiviviq, Depakote ER 1000 mg HS, lamotrigine 100 mg daily, lithium Cr 450 HS, mirtazapine 30 mg HS, stopped Trintellix, out of Ambien. Better overall mood since off the opiate.  Adderall seems more effective off the opiate.  Would like to go back up on the  amount to 30 XR and 15 IR daily. Beth says he's been up out of bed more than he was and applied for some jobs  so she notices some benefit. Last couple of weeks anxiety is pretty good.   Plan: Concerta without help so return to Adderall.   DC Trintellix Agrees to [employer] but go really low to start bc got agitated on it before. Continue mirtazapine 30 mg HS.   DC Ambien and no further RX for it.  05/18/2022 appointment noted: About 2 to 3 weeks ago stopped Latuda 20 mg because he felt agitated. Wife reports she is not doing well with regard to depression. 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  W doesn't think his thinking realistic.  He's been in bed for a week off Latuda. Got scammed in job situation.  Haven't felt as bad as this in a long time.  Feels like a disappointment.  Plan Amantadine 100 mg HS for a week then 100 mg BID Concerta without help so return to Adderall.   DC  mirtazapine 30 mg HS Seroquel 25-50 mg HS   DC Ambien and no further RX for it.  06/17/22 appt noted: Taking amantadine twice daily.  Didn't really notice anything as far as benefit. Anxiety has been up more "on fire" inside and feels afraid like something bad is about to happen.  Worse 3 days last week and 3 days this week crippling at times and usually mid afternoon.   Still too sleepy and tired to do anything and this causes anxiety.  Energy better briefly but down again.   Sleep better with Seroquel at nnight and more benefit.  But trouble waking up More desire to be engaged with people off all opiates.   Plan: DC Amantadine 100 Start Vraylar 1.5 mg QOD  Adderall.   Seroquel 25-50 mg HS   DC Ambien and no further RX for it. Increase Depakote ER 1500 mg HS  07/22/22 appt noted: No opiate for almost 2 mos.  Energy is better and once awake can stay awake. Pain is roughly the same but more consistent than when on opiates.  And can have spikes. Kratom helps without SE and occ delta 8 on separate occ and  it helps pain. No crazy buzz like THC. Meds: Vraylar 1.5 mg daily, Vit D 50 K week, Seroquel 50 mg HS, Depakote Er 1500 HS, lamotrigine 100 BID , lithium ER 450 HS, Adderall XR 30 AM and IR 10 mg PM Started testosterone which might help mood and energy too.   More consistent.   A little less dep and a little better energy.  Some hangover with Seroquel and hard to get up 7 AM.  Sleep much better with Seroquel.  Sometimes sleeps through night.  No other  SE.   No akathisia. Not much anxiety at all lately.   Dep 4/10.  Definitely best in a long time.    10/07/22 appt noted: CPAP causes anxiety dealing with it using gabapentin. Sleep 5-6 hours with occ of more.   Meds as above  gabapentin as noted. For the most part ok.  A lot of trouble getting up in the AM chronically but worse the last couple of weeks.  Good bit of fatigue gradually better.   Adderall doesn't wake him up.   Managing to do a few things during the day now.  Takes 1 and 1/2 hour to go to sleep chronically.  To sleep about 1130.  Awakens 2-3 times per night and night eat and then gets up late about noon.  In bed 12 hours daily.  Will sleep solid 7-10 AM regularly.   Always hard to wake up but may be worse with Seroquel.   Been stressing some and a little low side with dep.  Still has some panic.  Situational usually.   SE mild akathisia  12/30/22 appt noted: Not needing Seroquel usually for sleep.  Gabapentin now helping that and anxiety at 600-900 mg daily and helps pain some .  Some hangover but less than Seroquel. Most of time more alert with Adderall and Sunosi together.  However some days still drags.  No clear reason.  Last night slept well but still dragging today and then just wants to close his eyes.  He's having trouble functioning on days like this.   No SE  with it.   Mood up and down but more up than down since here.  When down is more isolated and withdrawn but usually brief.  More normal days with current med regimen.   Fatigue will cause him to get down. Sept is usually down month.  Was better with this this year.  Wife has noted his upswings in mood.  Can be a bit impulsive with upswings but not severe.  Can return things if needed.   10 yr ago back injury and can't do what he did back then.   SE some hangover with gabapentin seems unrelated to amount he takes.  Positional tremor with VPA.   Started injx testosterone for a month. Doing as well as the gel.  Libido is better.  Maybe recover better from physical activity.   Plan: DC Vraylar 1.5 mg QD bc ? Benefit and polypharmacy.    02/03/23 appt noted: Psych meds: stopped Vraylar.  Adderall XR 30 & IR 15 mg PM, Sunosi 150, Depakote ER 1500 HS, gabapentin 300 TID, lamotrigine 100 BID, Eskalith CR 450 HS.    Not taking Seroquel for sleep.  SE hangover Stopped Vraylar and more dep and irritable, increased fear feeling on sense of doom.  Mood definitely worse off the Vraylar.   Situational stress.  Still don't have a job.  A lot of trouble carrying through on things.  Fatigue with dep.  Napping a couple of times per day.  Poor function making marriage worse.      06/16/23 appt noted:  with wife Psych meds: Seroquel 100 BID, clonidine 0.1 BID, Vraylar 1.5, no lithium until last night. No SE. Anxious worried and down. Clonidine helped little with anxiety. He has been very agitated since being off his main mood stabilizing medications Depakote and lamotrigine.  He is having a great deal of anxiety.  He has trouble sitting still.  He has trouble sleeping.  He is motivated  to do anything that may be helpful.  He is not currently suicidal. See the attached notes for details around the events since November 2024.  He has had a course of ECT at [facility].  However he had severe agitation and confusion coming out of anesthesia.  It was so severe that he was somewhat violent for brief periods of time while he was confused.  He is not seen to gain significant benefits from  the ECT.  [facility] is reconsidering whether to continue it but it appears that will be discontinued. He is having shoulder surgery next Tuesday.   Past Psychiatric Medication Trials: Depakote 1500 Oxcarbazepine hyponatremia Lamotrigine 100 mg twice daily Lithium 900 tremor Saphris 20 sed Seroquel 600 sedation Olanzapine Vraylar 3 mg no response and some akathisia Caplyta restlessness Latuda questionable dose, brief and possibly agitated, 20 mg retry agitated & angry Rexulti  Pramipexole no response and restless Amantadine 100 BID NR  Paroxetine 40 sleepy, fluoxetine,  nortriptyline anxiety,  Wellbutrin no response,  Auvelity no response Trintellix 30 for a week, NR  Gabapentin 4000 mg no response for pain.  Benefit anxiety and sleep Lyrica SE Clonazepam 0.5 mg twice daily, alprazolam sedation Trazodone no response Ambien and Ambien CR abuse,  hydroxyzine, Lunesta, mirtazapine 30,  Thorazine 25 side effects of night eating, Seroquel 50 for sleep Quiviq Lyrica Adderall, modafinil 300, Concerta 72 mg AM poor response  Psychiatric hospitalization in 1997  Flowsheet Row ED from 10/31/2021 in [facility] RISK CATEGORY No Risk        Review of Systems:  Review of Systems  Constitutional:  Positive for fatigue.  Musculoskeletal:  Positive for back pain.  Neurological:  Positive for tremors.  Psychiatric/Behavioral:  Positive for agitation, decreased concentration, dysphoric mood and sleep disturbance. The patient is nervous/anxious.     Medications: I have reviewed the patient's current medications.  Current Outpatient Medications  Medication Sig Dispense Refill   cariprazine (VRAYLAR) 1.5 MG capsule Take 1.5 mg by mouth daily.     cloNIDine (CATAPRES) 0.1 MG tablet Take 1 tablet (0.1 mg total) by mouth 2 (two) times daily. 60 tablet 0   cloZAPine (CLOZARIL) 25 MG tablet 1 at night for 1 night, then 2 at night for 1 night, then 3  at night for 1 night , then 4 at night 15 tablet 0   lithium carbonate (ESKALITH) 450 MG ER tablet Take 1 tablet (450 mg total) by mouth at bedtime. 90 tablet 0   QUEtiapine (SEROQUEL) 100 MG tablet Take 1 tablet (100 mg total) by mouth at bedtime. (Patient taking differently: Take 100 mg by mouth 2 (two) times daily.) 90 tablet 0   testosterone cypionate (DEPOTESTOSTERONE CYPIONATE) 200 MG/ML injection Inject 0.5 mLs (100 mg total) into the muscle once a week. 10 mL 5   Vitamin D, Ergocalciferol, (DRISDOL) 1.25 MG (50000 UNIT) CAPS capsule Take 1 capsule (50,000 Units total) by mouth every 7 (seven) days. 15 capsule 1   acetaminophen (TYLENOL) 325 MG tablet Take 650 mg by mouth 2 (two) times daily as needed for moderate pain.     amphetamine-dextroamphetamine (ADDERALL XR) 30 MG 24 hr capsule Take 1 capsule (30 mg total) by mouth daily. (Patient not taking: Reported on 06/16/2023) 30 capsule 0   amphetamine-dextroamphetamine (ADDERALL) 15 MG tablet Take 1 tablet (15 mg) by mouth daily in the afternoon. (Patient not taking: Reported on 06/16/2023) 30 tablet 0   gabapentin (NEURONTIN) 300 MG capsule  Take 1 capsule (300 mg total) by mouth 4 (four) times daily. (Patient not taking: Reported on 06/16/2023) 120 capsule 0   ibuprofen (ADVIL,MOTRIN) 200 MG tablet Take 400 mg by mouth daily as needed for moderate pain. (Patient not taking: Reported on 06/16/2023)     lidocaine (LIDODERM) 5 % Place 1-3 patches onto the skin as directed. (Patient not taking: Reported on 06/16/2023) 270 patch 0   LORazepam (ATIVAN) 1 MG tablet Take 1 tablet by mouth twice daily as needed for anxiety and 2 tablets at night 100 tablet 0   Solriamfetol HCl (SUNOSI) 150 MG TABS Take 1 tablet (150 mg total) by mouth every morning. (Patient not taking: Reported on 06/16/2023) 30 tablet 1   No current facility-administered medications for this visit.    Medication Side Effects: None  Allergies:  Allergies  Allergen Reactions   Trileptal  [Oxcarbazepine] Other (See Comments)    Low sodium    Past Medical History:  Diagnosis Date   ADHD (attention deficit hyperactivity disorder)    Anxiety    Arthritis    knee and back   Bipolar 1 disorder (HCC)    Complication of anesthesia    Constipation    Depression    bi polar   GERD (gastroesophageal reflux disease)    Head injury    as a child ;had stitches in head   Headache    migraines   Hypertension    Mental disorder    Bipolar   PONV (postoperative nausea and vomiting)    Sleep apnea    wears Cpap   Testosterone deficiency    Trigeminal neuralgia of left side of face 04/01/2019   Left V2    Past Medical History, Surgical history, Social history, and Family history were reviewed and updated as appropriate.   Please see review of systems for further details on the patient's review from today.   Objective:   Physical Exam:  There were no vitals taken for this visit.  Physical Exam Neurological:     Mental Status: He is alert and oriented to person, place, and time.     Cranial Nerves: No dysarthria.  Psychiatric:        Attention and Perception: Attention and perception normal.        Mood and Affect: Mood is anxious and depressed. Affect is not blunt.        Speech: Speech normal.        Behavior: Behavior is agitated. Behavior is not aggressive. Behavior is cooperative.        Thought Content: Thought content normal. Thought content is not paranoid or delusional. Thought content does not include homicidal or suicidal ideation. Thought content does not include suicidal plan.        Cognition and Memory: Cognition and memory normal.        Judgment: Judgment normal.     Comments: Insight intact Considerably more depressed.     Lab Review:     Component Value Date/Time   NA 140 06/16/2023 1044   NA 139 05/25/2021 1501   K 4.2 06/16/2023 1044   CL 107 06/16/2023 1044   CO2 24 06/16/2023 1044   GLUCOSE 99 06/16/2023 1044   BUN 7 06/16/2023 1044    BUN 11 05/25/2021 1501   CREATININE 1.07 06/16/2023 1044   CALCIUM 9.3 06/16/2023 1044   PROT 6.9 09/22/2020 0927   ALBUMIN 4.5 09/22/2020 0927   AST 18 09/22/2020 0927   ALT 38 09/22/2020 0927  ALKPHOS 61 09/22/2020 0927   BILITOT <0.2 09/22/2020 0927   GFRNONAA >60 06/16/2023 1044   GFRAA >60 08/02/2016 1416       Component Value Date/Time   WBC 8.2 06/16/2023 1044   RBC 5.48 06/16/2023 1044   HGB 16.9 06/16/2023 1044   HCT 50.7 06/16/2023 1044   PLT 261 06/16/2023 1044   MCV 92.5 06/16/2023 1044   MCH 30.8 06/16/2023 1044   MCHC 33.3 06/16/2023 1044   RDW 13.2 06/16/2023 1044   LYMPHSABS 1.3 08/02/2016 1135   MONOABS 0.4 08/02/2016 1135   EOSABS 0.0 08/02/2016 1135   BASOSABS 0.0 08/02/2016 1135    Lithium Lvl  Date Value Ref Range Status  05/25/2021 0.5 0.5 - 1.2 mmol/L Final    Comment:    A concentration of 0.5-0.8 mmol/L is advised for long-term use; concentrations of up to 1.2 mmol/L may be necessary during acute treatment.                                  Detection Limit = 0.1                           <0.1 indicates None Detected      Lab Results  Component Value Date   VALPROATE 51 05/30/2019     .res Assessment: Plan:    Shawn Cervantes was seen today for follow-up, depression, anxiety, stress and sleeping problem.  Diagnoses and all orders for this visit:  Bipolar disorder with severe depression (HCC) -     cloZAPine (CLOZARIL) 25 MG tablet; 1 at night for 1 night, then 2 at night for 1 night, then 3 at night for 1 night , then 4 at night  Generalized anxiety disorder -     LORazepam (ATIVAN) 1 MG tablet; Take 1 tablet by mouth twice daily as needed for anxiety and 2 tablets at night  Attention deficit hyperactivity disorder (ADHD), predominantly inattentive type  Obstructive sleep apnea hypopnea, severe  Insomnia due to mental condition  Encounter for long-term (current) use of high-risk medication      50 minvideo  time with patient was  spent on counseling and coordination of care. 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  Multiple medication failures. At this time generalized anxiety  managed. Depressionworse off Vraylar. No opiates for several mos.  Better energy.   But still mood cycles with fatigue.  In last 6 weeks about a week of normal but better than it was.    Insomnia is a chronic intermittent problem.  Better on gabapentin.  But still a problem with sleep hygiene part of the problem.  Too much time in bed.  Disc sleep hygiene.    Discussed potential benefits, risks, and side effects of stimulants with patient to include increased heart rate, palpitations, insomnia, increased anxiety, increased irritability, or decreased appetite.  Instructed patient to contact office if experiencing any significant tolerability issues.  OSA severe on CPAP as of early June 2024 Consider Sunosi for fatigue and sleepiness to further help function.    Option clozapine, or Seroquel, Option ECT.  Disc in detail.  Option selegiline DT excessive sleepiness and fatigue.  Retry pramipexole (highest was 1.5 mg daily) but hx restlessness with it.  Cont Adderall XR 30 AM and IR 15 mg PM   Continue Seroquel 100 mg twice daily Resume Depakote ER 1500 mg HS.  Continue lithium CR 450 Mg HS Vraylar 1.5 mg  Extensive review of the chart and discussion with the patient about the next best options.  From a medication standpoint that would clearly be Clozapine.  We had extensive discussion about the potential side effects both mild and serious with clozapine such as agranulocytosis and myocarditis as well as the more common side effects like drooling, sedation, dizziness or lightheadedness.  He agrees to start clozapine but wants to wait until after his shoulder surgery next Tuesday.  He had a CBC today which was normal.  Because of his pending surgery we will start the clozapine at a low dose 25 mg a day and increase daily to 100 mg in  the first week and then go up by 50 mg increments to a target of 3 to 400 mg as tolerated. He and his wife agree with this plan.  DC Ambien and no further RX for it.  Follow-up in 1 to 2 weeks  Discussed side effects of each medication.  Meredith Staggers MD, DFAPA   Please see After Visit Summary for patient specific instructions.  No future appointments.     No orders of the defined types were placed in this encounter.   -------------------------------bi

## 2023-06-19 ENCOUNTER — Other Ambulatory Visit (HOSPITAL_COMMUNITY): Payer: Self-pay

## 2023-06-19 ENCOUNTER — Other Ambulatory Visit: Payer: Self-pay | Admitting: Psychiatry

## 2023-06-19 ENCOUNTER — Other Ambulatory Visit: Payer: Self-pay

## 2023-06-19 DIAGNOSIS — F314 Bipolar disorder, current episode depressed, severe, without psychotic features: Secondary | ICD-10-CM

## 2023-06-19 DIAGNOSIS — F411 Generalized anxiety disorder: Secondary | ICD-10-CM

## 2023-06-19 MED ORDER — LITHIUM CARBONATE ER 450 MG PO TBCR
450.0000 mg | EXTENDED_RELEASE_TABLET | Freq: Every day | ORAL | 0 refills | Status: DC
  Filled 2023-06-19: qty 90, 90d supply, fill #0

## 2023-06-19 MED ORDER — GABAPENTIN 300 MG PO CAPS
300.0000 mg | ORAL_CAPSULE | Freq: Four times a day (QID) | ORAL | 0 refills | Status: DC
  Filled 2023-06-19: qty 120, 30d supply, fill #0

## 2023-06-19 NOTE — H&P (Signed)
 Shawn Cervantes is an 53 y.o. male.   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  He is here about his right dominant shoulder.  He has been seen elsewhere and offered some surgery but I have treated his wife in the past and she has brought him in today.  He says he has had trouble with this shoulder through the years but about 2 months ago he threw something and had extreme pain which has persisted.  He went to an orthopedist who tried an injection which did not help.  He subsequently had an MRI scan performed and has now been offered surgery.  He has difficulty using his arm and sleeping at night.  The pain is extreme.   Imaging/Tests: I reviewed an MRI scan films and report of a study done through the [facility] system on 04/25/23.  This shows large retracted rotator cuff tears back towards the glenoid.  There is also significant muscle atrophy.  The biceps tendon appears intact.  He has mild to moderate degenerative change.   Past Medical History:  Diagnosis Date   ADHD (attention deficit hyperactivity disorder)    Anxiety    Arthritis    knee and back   Bipolar 1 disorder (HCC)    Complication of anesthesia    Constipation    Depression    bi polar   GERD (gastroesophageal reflux disease)    Head injury    as a child ;had stitches in head   Headache    migraines   Hypertension    Mental disorder    Bipolar   PONV (postoperative nausea and vomiting)    Sleep apnea    wears Cpap   Testosterone deficiency    Trigeminal neuralgia of left side of face 04/01/2019   Left V2    Past Surgical History:  Procedure Laterality Date   BACK SURGERY  2021   replaced stimulator/leads   HAND SURGERY Left    tendon repai x2   LUMBAR LAMINECTOMY Right 04/01/2013   Procedure: MICRODISCECTOMY LUMBAR LAMINECTOMY;  Surgeon: Eldred Manges, MD;  Location: [facility] OR;  Service: Orthopedics;  Laterality: Right;  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   LUMBAR WOUND DEBRIDEMENT N/A 07/30/2014   Procedure: LUMBAR WOUND IRRIGATION AND DEBRIDEMENT,        ;  Surgeon: Tia Alert, MD;  Location: [facility];  Service: Neurosurgery;  Laterality: N/A;   MICRODISCECTOMY LUMBAR     L4-5   MICRODISCECTOMY LUMBAR     L4-5   SPINAL CORD STIMULATOR INSERTION N/A 09/25/2015   Procedure: LUMBAR SPINAL CORD STIMULATOR INSERTION;  Surgeon: Odette Fraction, MD;  Location: [facility];  Service: Neurosurgery;  Laterality: N/A;  LUMBAR SPINAL CORD STIMULATOR INSERTION   TRANSFORAMINAL LUMBAR INTERBODY FUSION (TLIF) WITH PEDICLE SCREW FIXATION 1 LEVEL N/A 07/17/2014   Procedure: Transforaminal Lumbar Interbody Fusion Lumbar four-five with pedicle screws  ;  Surgeon: Tia Alert, MD;  Location: [facility];  Service: Neurosurgery;  Laterality: N/A;   VASECTOMY     WRIST ARTHROSCOPY     WRIST SURGERY Right    reconstution    Family History  Problem Relation Age of Onset   Hypertension Mother    Depression Mother    Anxiety disorder Mother    Alcoholism Father    Diabetes Father    Social History:  reports that he quit smoking about 9 years  ago. His smoking use included cigars and cigarettes. He has never used smokeless tobacco. He reports that he does not drink alcohol and does not use drugs.  Allergies:  Allergies  Allergen Reactions   Trileptal [Oxcarbazepine] Other (See Comments)    Low sodium    No medications prior to admission.    No results found for this or any previous visit (from the past 48 hours). No results found.  Review of Systems  Musculoskeletal:  Positive for arthralgias.       Right shoulder  All other systems reviewed and are negative.   There were no vitals taken for this visit. Physical Exam Constitutional:      Appearance: Normal appearance.  HENT:     Head: Normocephalic and atraumatic.     Nose: Nose normal.     Mouth/Throat:     Pharynx: Oropharynx is clear.  Eyes:     Extraocular  Movements: Extraocular movements intact.  Pulmonary:     Effort: Pulmonary effort is normal.  Abdominal:     Palpations: Abdomen is soft.  Musculoskeletal:     Cervical back: Normal range of motion.     Comments: Right shoulder motion actively is to 110 of forward flexion with external rotation to about 40 and internal rotation to his side pocket.  Passively he can go further in forward flexion.  He has significant cuff weakness in both positions both of which are painful.  Cervical motion does not cause any arm pain.  There is no palpable lymphadenopathy about his neck.  Sensation and motor function are intact in his hands with palpable pulses on both sides.    Skin:    General: Skin is warm and dry.  Neurological:     General: No focal deficit present.     Mental Status: He is alert and oriented to person, place, and time. Mental status is at baseline.  Psychiatric:        Mood and Affect: Mood normal.        Behavior: Behavior normal.      Assessment/Plan Assessment:   Right shoulder massive rotator cuff tear by MRI 2025 injected again today  Plan: Shawn Cervantes has extreme pain about his shoulder.  I told him he has several options.  One is to simply try physical therapy.  Another is a reverse TSR.  A third option is arthroscopy with physical therapy.  I told him if I were him I would consider the third option.  I think he has about a 3 in 4 chance of this very successful.  Were he to fail that reverse TSR would be an option but again at age 67 I think he should opt for the arthroscopy with physical therapy.  I reviewed risk of anesthesia, infection, DVT.  I stressed the importance of the postoperative PT to optimize result.    Ginger Organ Paige Monarrez, PA-C 06/19/2023, 12:49 PM

## 2023-06-20 ENCOUNTER — Ambulatory Visit (HOSPITAL_BASED_OUTPATIENT_CLINIC_OR_DEPARTMENT_OTHER): Payer: Self-pay | Admitting: Registered Nurse

## 2023-06-20 ENCOUNTER — Ambulatory Visit (HOSPITAL_COMMUNITY)
Admission: RE | Admit: 2023-06-20 | Discharge: 2023-06-20 | Disposition: A | Discharge status: Home / Self Care | Attending: Orthopaedic Surgery | Admitting: Orthopaedic Surgery

## 2023-06-20 ENCOUNTER — Encounter (HOSPITAL_COMMUNITY): Payer: Self-pay | Admitting: Orthopaedic Surgery

## 2023-06-20 ENCOUNTER — Other Ambulatory Visit (HOSPITAL_COMMUNITY): Payer: Self-pay

## 2023-06-20 ENCOUNTER — Ambulatory Visit (HOSPITAL_COMMUNITY): Payer: Self-pay | Admitting: Registered Nurse

## 2023-06-20 ENCOUNTER — Encounter (HOSPITAL_COMMUNITY)
Admission: RE | Disposition: A | Discharge status: Home / Self Care | Payer: Self-pay | Source: Home / Self Care | Attending: Orthopaedic Surgery

## 2023-06-20 DIAGNOSIS — I10 Essential (primary) hypertension: Secondary | ICD-10-CM | POA: Diagnosis not present

## 2023-06-20 DIAGNOSIS — X509XXA Other and unspecified overexertion or strenuous movements or postures, initial encounter: Secondary | ICD-10-CM | POA: Diagnosis not present

## 2023-06-20 DIAGNOSIS — Z8249 Family history of ischemic heart disease and other diseases of the circulatory system: Secondary | ICD-10-CM | POA: Insufficient documentation

## 2023-06-20 DIAGNOSIS — M75101 Unspecified rotator cuff tear or rupture of right shoulder, not specified as traumatic: Secondary | ICD-10-CM | POA: Insufficient documentation

## 2023-06-20 DIAGNOSIS — F319 Bipolar disorder, unspecified: Secondary | ICD-10-CM | POA: Diagnosis not present

## 2023-06-20 DIAGNOSIS — G500 Trigeminal neuralgia: Secondary | ICD-10-CM | POA: Diagnosis not present

## 2023-06-20 DIAGNOSIS — G4730 Sleep apnea, unspecified: Secondary | ICD-10-CM | POA: Insufficient documentation

## 2023-06-20 DIAGNOSIS — E669 Obesity, unspecified: Secondary | ICD-10-CM | POA: Diagnosis not present

## 2023-06-20 DIAGNOSIS — Z6832 Body mass index (BMI) 32.0-32.9, adult: Secondary | ICD-10-CM | POA: Insufficient documentation

## 2023-06-20 DIAGNOSIS — K219 Gastro-esophageal reflux disease without esophagitis: Secondary | ICD-10-CM | POA: Insufficient documentation

## 2023-06-20 DIAGNOSIS — Z87891 Personal history of nicotine dependence: Secondary | ICD-10-CM | POA: Insufficient documentation

## 2023-06-20 DIAGNOSIS — G8918 Other acute postprocedural pain: Secondary | ICD-10-CM | POA: Diagnosis not present

## 2023-06-20 HISTORY — PX: SUBACROMIAL DECOMPRESSION: SHX5174

## 2023-06-20 HISTORY — PX: POSTERIOR LUMBAR FUSION 2 WITH HARDWARE REMOVAL: SHX7297

## 2023-06-20 SURGERY — DECOMPRESSION, SUBACROMIAL SPACE
Anesthesia: General | Laterality: Right

## 2023-06-20 MED ORDER — ROCURONIUM BROMIDE 10 MG/ML (PF) SYRINGE
PREFILLED_SYRINGE | INTRAVENOUS | Status: AC
  Filled 2023-06-20: qty 10

## 2023-06-20 MED ORDER — SCOPOLAMINE 1 MG/3DAYS TD PT72
1.0000 | MEDICATED_PATCH | Freq: Once | TRANSDERMAL | Status: AC
  Administered 2023-06-20: 1 via TRANSDERMAL
  Filled 2023-06-20: qty 1

## 2023-06-20 MED ORDER — LIDOCAINE HCL (PF) 2 % IJ SOLN
INTRAMUSCULAR | Status: DC | PRN
Start: 2023-06-20 — End: 2023-06-20
  Administered 2023-06-20: 80 mg via INTRADERMAL

## 2023-06-20 MED ORDER — DEXMEDETOMIDINE HCL IN NACL 80 MCG/20ML IV SOLN
INTRAVENOUS | Status: AC
  Filled 2023-06-20: qty 20

## 2023-06-20 MED ORDER — DEXMEDETOMIDINE HCL IN NACL 80 MCG/20ML IV SOLN
INTRAVENOUS | Status: DC | PRN
  Administered 2023-06-20: 4 ug via INTRAVENOUS

## 2023-06-20 MED ORDER — EPINEPHRINE PF 1 MG/ML IJ SOLN
INTRAMUSCULAR | Status: DC | PRN
  Administered 2023-06-20: .3 mg

## 2023-06-20 MED ORDER — PROPOFOL 10 MG/ML IV BOLUS
INTRAVENOUS | Status: AC
  Filled 2023-06-20: qty 20

## 2023-06-20 MED ORDER — ONDANSETRON HCL 4 MG/2ML IJ SOLN
INTRAMUSCULAR | Status: AC
  Filled 2023-06-20: qty 2

## 2023-06-20 MED ORDER — FENTANYL CITRATE PF 50 MCG/ML IJ SOSY
50.0000 ug | PREFILLED_SYRINGE | INTRAMUSCULAR | Status: DC
  Administered 2023-06-20: 50 ug via INTRAVENOUS
  Filled 2023-06-20: qty 2

## 2023-06-20 MED ORDER — CHLORHEXIDINE GLUCONATE 0.12 % MT SOLN: 15.0000 mL | Freq: Once | OROMUCOSAL | Status: DC

## 2023-06-20 MED ORDER — EPINEPHRINE PF 1 MG/ML IJ SOLN
INTRAMUSCULAR | Status: AC
  Filled 2023-06-20: qty 1

## 2023-06-20 MED ORDER — ROCURONIUM BROMIDE 10 MG/ML (PF) SYRINGE
PREFILLED_SYRINGE | INTRAVENOUS | Status: DC | PRN
  Administered 2023-06-20: 50 mg via INTRAVENOUS

## 2023-06-20 MED ORDER — DEXAMETHASONE SODIUM PHOSPHATE 10 MG/ML IJ SOLN
INTRAMUSCULAR | Status: DC | PRN
  Administered 2023-06-20: 8 mg via INTRAVENOUS

## 2023-06-20 MED ORDER — ONDANSETRON HCL 4 MG/2ML IJ SOLN: 4.0000 mg | Freq: Once | INTRAMUSCULAR | Status: DC | PRN

## 2023-06-20 MED ORDER — FENTANYL CITRATE PF 50 MCG/ML IJ SOSY: 25.0000 ug | PREFILLED_SYRINGE | INTRAMUSCULAR | Status: DC | PRN

## 2023-06-20 MED ORDER — PROPOFOL 10 MG/ML IV BOLUS
INTRAVENOUS | Status: DC | PRN
  Administered 2023-06-20: 150 mg via INTRAVENOUS

## 2023-06-20 MED ORDER — FENTANYL CITRATE (PF) 100 MCG/2ML IJ SOLN
INTRAMUSCULAR | Status: AC
  Filled 2023-06-20: qty 2

## 2023-06-20 MED ORDER — MIDAZOLAM HCL 2 MG/2ML IJ SOLN
1.0000 mg | INTRAMUSCULAR | Status: DC
  Administered 2023-06-20: 2 mg via INTRAVENOUS
  Filled 2023-06-20: qty 2

## 2023-06-20 MED ORDER — SODIUM CHLORIDE 0.9 % IR SOLN
Status: DC | PRN
  Administered 2023-06-20 (×2): 3000 mL

## 2023-06-20 MED ORDER — ORAL CARE MOUTH RINSE: 15.0000 mL | Freq: Once | OROMUCOSAL | Status: DC

## 2023-06-20 MED ORDER — LIDOCAINE HCL (PF) 2 % IJ SOLN
INTRAMUSCULAR | Status: AC
  Filled 2023-06-20: qty 5

## 2023-06-20 MED ORDER — ACETAMINOPHEN 500 MG PO TABS: 1000.0000 mg | ORAL_TABLET | Freq: Once | ORAL | Status: DC

## 2023-06-20 MED ORDER — CEFAZOLIN SODIUM-DEXTROSE 2-4 GM/100ML-% IV SOLN
2.0000 g | INTRAVENOUS | Status: AC
  Administered 2023-06-20: 2 g via INTRAVENOUS
  Filled 2023-06-20: qty 100

## 2023-06-20 MED ORDER — PHENYLEPHRINE HCL-NACL 20-0.9 MG/250ML-% IV SOLN
INTRAVENOUS | Status: DC | PRN
  Administered 2023-06-20: 25 ug/min via INTRAVENOUS

## 2023-06-20 MED ORDER — BUPIVACAINE HCL (PF) 0.5 % IJ SOLN
INTRAMUSCULAR | Status: DC | PRN
Start: 2023-06-20 — End: 2023-06-20
  Administered 2023-06-20: 10 mL via PERINEURAL

## 2023-06-20 MED ORDER — AMISULPRIDE (ANTIEMETIC) 5 MG/2ML IV SOLN: 10.0000 mg | Freq: Once | INTRAVENOUS | Status: DC | PRN

## 2023-06-20 MED ORDER — BUPIVACAINE LIPOSOME 1.3 % IJ SUSP
INTRAMUSCULAR | Status: DC | PRN
  Administered 2023-06-20: 10 mL via PERINEURAL

## 2023-06-20 MED ORDER — ONDANSETRON HCL 4 MG/2ML IJ SOLN
INTRAMUSCULAR | Status: DC | PRN
  Administered 2023-06-20: 4 mg via INTRAVENOUS

## 2023-06-20 MED ORDER — PHENYLEPHRINE 80 MCG/ML (10ML) SYRINGE FOR IV PUSH (FOR BLOOD PRESSURE SUPPORT)
PREFILLED_SYRINGE | INTRAVENOUS | Status: AC
  Filled 2023-06-20: qty 10

## 2023-06-20 MED ORDER — LACTATED RINGERS IV SOLN: INTRAVENOUS | Status: DC

## 2023-06-20 MED ORDER — HYDROCODONE-ACETAMINOPHEN 5-325 MG PO TABS
1.0000 | ORAL_TABLET | Freq: Four times a day (QID) | ORAL | 0 refills | Status: DC | PRN
  Filled 2023-06-20: qty 15, 2d supply, fill #0

## 2023-06-20 MED ORDER — PHENYLEPHRINE 80 MCG/ML (10ML) SYRINGE FOR IV PUSH (FOR BLOOD PRESSURE SUPPORT)
PREFILLED_SYRINGE | INTRAVENOUS | Status: DC | PRN
  Administered 2023-06-20: 80 ug via INTRAVENOUS

## 2023-06-20 MED ORDER — SUGAMMADEX SODIUM 200 MG/2ML IV SOLN
INTRAVENOUS | Status: DC | PRN
  Administered 2023-06-20: 200 mg via INTRAVENOUS

## 2023-06-20 MED ORDER — DEXAMETHASONE SODIUM PHOSPHATE 10 MG/ML IJ SOLN
INTRAMUSCULAR | Status: AC
  Filled 2023-06-20: qty 1

## 2023-06-20 MED ORDER — FENTANYL CITRATE (PF) 100 MCG/2ML IJ SOLN
INTRAMUSCULAR | Status: DC | PRN
  Administered 2023-06-20: 100 ug via INTRAVENOUS

## 2023-06-20 SURGICAL SUPPLY — 56 items
BENZOIN TINCTURE PRP APPL 2/3 (GAUZE/BANDAGES/DRESSINGS) IMPLANT
BLADE EXCALIBUR 4.0X13 (MISCELLANEOUS) ×2 IMPLANT
BLADE SURG 15 STRL LF DISP TIS (BLADE) IMPLANT
BOOTIES KNEE HIGH SLOAN (MISCELLANEOUS) IMPLANT
BURR OVAL 8 FLU 4.0X13 (MISCELLANEOUS) IMPLANT
CANNULA SHOULDER 7CM (CANNULA) ×2 IMPLANT
CANNULA TWIST IN 8.25X7CM (CANNULA) IMPLANT
DERMABOND ADVANCED .7 DNX12 (GAUZE/BANDAGES/DRESSINGS) IMPLANT
DRAPE STERI 35X30 U-POUCH (DRAPES) ×2 IMPLANT
DRAPE SURG ORHT 6 SPLT 77X108 (DRAPES) IMPLANT
DRAPE U-SHAPE 47X51 STRL (DRAPES) ×2 IMPLANT
DRAPE U-SHAPE 76X120 STRL (DRAPES) ×4 IMPLANT
DRSG EMULSION OIL 3X3 NADH (GAUZE/BANDAGES/DRESSINGS) ×2 IMPLANT
DURAPREP 26ML APPLICATOR (WOUND CARE) ×2 IMPLANT
ELECT REM PT RETURN 9FT ADLT (ELECTROSURGICAL) ×1 IMPLANT
ELECTRODE REM PT RTRN 9FT ADLT (ELECTROSURGICAL) ×2 IMPLANT
GAUZE PAD ABD 8X10 STRL (GAUZE/BANDAGES/DRESSINGS) ×2 IMPLANT
GAUZE SPONGE 4X4 12PLY STRL (GAUZE/BANDAGES/DRESSINGS) ×2 IMPLANT
GLOVE BIO SURGEON STRL SZ8 (GLOVE) ×4 IMPLANT
GLOVE BIOGEL PI IND STRL 7.0 (GLOVE) IMPLANT
GLOVE BIOGEL PI IND STRL 8 (GLOVE) ×4 IMPLANT
GLOVE SURG SYN 7.0 (GLOVE) ×1 IMPLANT
GLOVE SURG SYN 7.0 PF PI (GLOVE) IMPLANT
GOWN SRG XL LVL 4 BRTHBL STRL (GOWNS) IMPLANT
GOWN STRL REUS W/ TWL LRG LVL3 (GOWN DISPOSABLE) ×2 IMPLANT
GOWN STRL REUS W/ TWL XL LVL3 (GOWN DISPOSABLE) ×4 IMPLANT
KIT BASIN OR (CUSTOM PROCEDURE TRAY) ×2 IMPLANT
MANIFOLD NEPTUNE II (INSTRUMENTS) ×2 IMPLANT
NDL HD SCORPION MEGA LOADER (NEEDLE) IMPLANT
NDL SUT 6 .5 CRC .975X.05 MAYO (NEEDLE) IMPLANT
NS IRRIG 1000ML POUR BTL (IV SOLUTION) IMPLANT
PACK ARTHROSCOPY DSU (CUSTOM PROCEDURE TRAY) ×2 IMPLANT
PASSER SUT SWANSON 36MM LOOP (INSTRUMENTS) IMPLANT
PENCIL SMOKE EVACUATOR (MISCELLANEOUS) IMPLANT
RESTRAINT HEAD UNIVERSAL NS (MISCELLANEOUS) ×2 IMPLANT
SHEET MEDIUM DRAPE 40X70 STRL (DRAPES) ×2 IMPLANT
SLEEVE SCD COMPRESS KNEE MED (STOCKING) IMPLANT
SLING ARM FOAM STRAP LRG (SOFTGOODS) IMPLANT
SPIKE FLUID TRANSFER (MISCELLANEOUS) IMPLANT
SPONGE T-LAP 4X18 ~~LOC~~+RFID (SPONGE) IMPLANT
STRIP CLOSURE SKIN 1/2X4 (GAUZE/BANDAGES/DRESSINGS) IMPLANT
SUCTION TUBE FRAZIER 10FR DISP (SUCTIONS) IMPLANT
SUT ETHIBOND 2 OS 4 DA (SUTURE) IMPLANT
SUT ETHILON 3 0 PS 1 (SUTURE) ×2 IMPLANT
SUT FIBERWIRE #2 38 T-5 BLUE (SUTURE) IMPLANT
SUT PDS AB 2-0 CT2 27 (SUTURE) IMPLANT
SUT VIC AB 2-0 SH 27XBRD (SUTURE) IMPLANT
SUT VIC AB 4-0 PS2 18 (SUTURE) IMPLANT
SUT VICRYL 0 SH 27 (SUTURE) IMPLANT
SUTURE FIBERWR #2 38 T-5 BLUE (SUTURE) IMPLANT
SYR BULB EAR ULCER 3OZ GRN STR (SYRINGE) IMPLANT
TOWEL GREEN STERILE FF (TOWEL DISPOSABLE) ×2 IMPLANT
TUBING ARTHROSCOPY IRRIG 16FT (MISCELLANEOUS) ×2 IMPLANT
WAND ABLATOR APOLLO I90 (BUR) IMPLANT
WATER STERILE IRR 1000ML POUR (IV SOLUTION) ×2 IMPLANT
YANKAUER SUCT BULB TIP NO VENT (SUCTIONS) IMPLANT

## 2023-06-20 NOTE — Transfer of Care (Signed)
 Immediate Anesthesia Transfer of Care Note  Patient: Shawn Cervantes  Procedure(s) Performed: DECOMPRESSION, SUBACROMIAL SPACE (Right) ARTHROSCOPY, SHOULDER WITH DEBRIDEMENT AND CHROMIOPLASTY (Right)  Patient Location: PACU  Anesthesia Type:General  Level of Consciousness: awake, alert , and oriented  Airway & Oxygen Therapy: Patient Spontanous Breathing and Patient connected to face mask oxygen  Post-op Assessment: Report given to RN  Post vital signs: Reviewed and stable  Last Vitals:  Vitals Value Taken Time  BP 153/99 06/20/23 1040  Temp    Pulse 76 06/20/23 1043  Resp 17 06/20/23 1043  SpO2 91 % 06/20/23 1043  Vitals shown include unfiled device data.  Last Pain:  Vitals:   06/20/23 0900  TempSrc:   PainSc: 0-No pain      Patients Stated Pain Goal: 4 (06/20/23 0752)  Complications: No notable events documented.

## 2023-06-20 NOTE — Brief Op Note (Signed)
 Shawn Cervantes [id number] 06/20/2023   PRE-OP DIAGNOSIS: right sh massive RCT  POST-OP DIAGNOSIS: same  PROCEDURE: right sh scope  ANESTHESIA: general and block  Velna Ochs   Dictation #:  [id number]

## 2023-06-20 NOTE — Anesthesia Procedure Notes (Signed)
 Anesthesia Regional Block: Interscalene brachial plexus block   Pre-Anesthetic Checklist: , timeout performed,  Correct Patient, Correct Site, Correct Laterality,  Correct Procedure, Correct Position, site marked,  Risks and benefits discussed,  Surgical consent,  Pre-op evaluation,  At surgeon's request and post-op pain management  Laterality: Right  Prep: chloraprep       Needles:  Injection technique: Single-shot  Needle Type: Echogenic Stimulator Needle     Needle Length: 9cm  Needle Gauge: 22     Additional Needles:   Procedures:,,,, ultrasound used (permanent image in chart),,    Narrative:  Start time: 06/20/2023 8:42 AM End time: 06/20/2023 8:50 AM Injection made incrementally with aspirations every 5 mL.  Performed by: Personally  Anesthesiologist: Collene Schlichter, MD  Additional Notes: Functioning IV was confirmed and monitors were applied.  A 90mm 22ga echogenic stimulator needle was used. Sterile prep and drape, hand hygiene, and sterile gloves were used.  Negative aspiration and negative test dose prior to incremental administration of local anesthetic. The patient tolerated the procedure well.  Ultrasound guidance: relevent anatomy identified, needle position confirmed, local anesthetic spread visualized around nerve(s), vascular puncture avoided.  Image printed for medical record.

## 2023-06-20 NOTE — Interval H&P Note (Signed)
 History and Physical Interval Note:  06/20/2023 8:54 AM  Shawn Cervantes  has presented today for surgery, with the diagnosis of RIGHT SHOULDER ROTATOR CUFF TEAR.  The various methods of treatment have been discussed with the patient and family. After consideration of risks, benefits and other options for treatment, the patient has consented to  Procedure(s) with comments: DECOMPRESSION, SUBACROMIAL SPACE (Right) - RIGHT SHOULDER ARTHROSCOPY ARTHROSCOPY, SHOULDER WITH DEBRIDEMENT (Right) as a surgical intervention.  The patient's history has been reviewed, patient examined, no change in status, stable for surgery.  I have reviewed the patient's chart and labs.  Questions were answered to the patient's satisfaction.     Velna Ochs

## 2023-06-20 NOTE — Op Note (Signed)
 Shawn Cervantes, [name] RECORD NO: [id number] ACCOUNT NO: [id number] DATE OF BIRTH: [birth year] FACILITY: [facility] LOCATION: [facility] PHYSICIAN: Lubertha Basque. Jerl Santos, MD  Operative Report   DATE OF PROCEDURE: 06/20/2023  PREOPERATIVE DIAGNOSIS:  Right shoulder rotator cuff tear.  POSTOPERATIVE DIAGNOSIS:  Right shoulder rotator cuff tear.  PROCEDURE PERFORMED: 1.  Right shoulder arthroscopic debridement. 2.  Right shoulder arthroscopic acromioplasty.  SURGEON:  Dr. Jerl Santos.  ASSISTANT:  Ardelia Mems, PA  ANESTHESIA:  General and block.  INDICATIONS FOR PROCEDURE:  The patient is a 53 year old man with a long history of right shoulder pain.  This has persisted despite various conservative measures including therapy and injection.  By MRI scan, he has a massive rotator cuff tear with  muscle atrophy.  He has offered arthroscopic debridement in hopes of ameliorating the situation somewhat.  He understands he might need a larger operation at some point down the road.  Informed operative consent was obtained after discussion of possible  complications including reaction to anesthesia and infection.  SUMMARY FINDINGS AND PROCEDURE:  Under general anesthesia and a block, a right shoulder arthroscopy was performed.  Glenohumeral joint showed no degenerative change and the biceps tendon was intact. The rotator cuff had a massive tear retracted back  towards the glenoid. There were no degenerative changes in the glenohumeral joint. He had a moderately prominent subacromial morphology addressed with an acromioplasty followed by debridement of the cuff leaving anterior and posterior sleeves of tissue  to operate the humeral head.  He was scheduled to go home the same day.   DESCRIPTION OF PROCEDURE:  The patient was taken to an operating suite where general anesthetic was applied without difficulty.  He was also given a block in the pre-anesthesia area.  He was positioned in a beach chair  position and prepped and draped in  a normal sterile fashion.  After the administration of pre-op IV Kefzol and an appropriate timeout, an arthroscopy of the right shoulder was performed through a total of two portals.  Findings were as noted above.  The procedure consisted initially of  the acromioplasty done with the burr in the lateral position followed by transfer the burr to the posterior position.  I then performed debridement of his massive cuff tear but left him with anterior and posterior sleeves of tissue, which should operate  the humeral head well.  The debridement was taken back towards the glenoid centrally.  The shoulder was thoroughly irrigated followed by removal of arthroscopic equipment.  We loosely reapproximated portals with nylon followed by Adaptic, dry gauze, and  tape.  Estimated blood loss and intraoperative fluids can be obtained from anesthesia records.   DISPOSITION:  The patient was extubated in the operating room and taken to the recovery room in stable condition. Plans were for him to go home the same day and follow up in the office in less than a week. I will contact him by phone tonight.   MUK D: 06/20/2023 10:30:00 am T: 06/20/2023 11:11:00 am  JOB: [id number]/ [id number]

## 2023-06-20 NOTE — Anesthesia Procedure Notes (Signed)
 Procedure Name: Intubation Date/Time: 06/20/2023 9:51 AM  Performed by: Elisabeth Cara, CRNAPre-anesthesia Checklist: Patient identified, Emergency Drugs available, Suction available, Patient being monitored and Timeout performed Patient Re-evaluated:Patient Re-evaluated prior to induction Oxygen Delivery Method: Circle system utilized Preoxygenation: Pre-oxygenation with 100% oxygen Induction Type: IV induction Ventilation: Mask ventilation without difficulty Laryngoscope Size: Mac and 4 Grade View: Grade I Tube type: Oral Tube size: 7.5 mm Number of attempts: 1 Airway Equipment and Method: Stylet Placement Confirmation: ETT inserted through vocal cords under direct vision, positive ETCO2 and breath sounds checked- equal and bilateral Secured at: 23 cm Tube secured with: Tape Dental Injury: Teeth and Oropharynx as per pre-operative assessment

## 2023-06-20 NOTE — Anesthesia Preprocedure Evaluation (Addendum)
 Anesthesia Evaluation  Patient identified by MRN, date of birth, ID band Patient awake    Reviewed: Allergy & Precautions, NPO status , Patient's Chart, lab work & pertinent test results  History of Anesthesia Complications (+) PONV and history of anesthetic complications  Airway Mallampati: III  TM Distance: >3 FB Neck ROM: Full    Dental  (+) Teeth Intact, Dental Advisory Given   Pulmonary sleep apnea and Continuous Positive Airway Pressure Ventilation , former smoker   Pulmonary exam normal breath sounds clear to auscultation       Cardiovascular hypertension, Pt. on medications Normal cardiovascular exam Rhythm:Regular Rate:Normal     Neuro/Psych  Headaches PSYCHIATRIC DISORDERS Anxiety Depression Bipolar Disorder   Trigeminal neuralgia of left side of face  Neuromuscular disease    GI/Hepatic Neg liver ROS,GERD  ,,  Endo/Other  Obesity   Renal/GU negative Renal ROS     Musculoskeletal  (+) Arthritis ,  RIGHT SHOULDER ROTATOR CUFF TEAR   Abdominal   Peds  (+) ADHD Hematology negative hematology ROS (+)   Anesthesia Other Findings Day of surgery medications reviewed with the patient.  Reproductive/Obstetrics                             Anesthesia Physical Anesthesia Plan  ASA: 2  Anesthesia Plan: General   Post-op Pain Management: Regional block*, Tylenol PO (pre-op)* and Toradol IV (intra-op)*   Induction: Intravenous  PONV Risk Score and Plan: 3 and Midazolam, Dexamethasone, Ondansetron and Scopolamine patch - Pre-op  Airway Management Planned: Oral ETT  Additional Equipment:   Intra-op Plan:   Post-operative Plan: Extubation in OR  Informed Consent: I have reviewed the patients History and Physical, chart, labs and discussed the procedure including the risks, benefits and alternatives for the proposed anesthesia with the patient or authorized representative who has  indicated his/her understanding and acceptance.     Dental advisory given  Plan Discussed with: CRNA  Anesthesia Plan Comments:        Anesthesia Quick Evaluation

## 2023-06-21 ENCOUNTER — Other Ambulatory Visit: Payer: Self-pay | Admitting: Psychiatry

## 2023-06-21 ENCOUNTER — Encounter (HOSPITAL_COMMUNITY): Payer: Self-pay | Admitting: Orthopaedic Surgery

## 2023-06-21 NOTE — Anesthesia Postprocedure Evaluation (Addendum)
 Anesthesia Post Note  Patient: Shawn Cervantes  Procedure(s) Performed: DECOMPRESSION, SUBACROMIAL SPACE (Right) ARTHROSCOPY, SHOULDER WITH DEBRIDEMENT AND CHROMIOPLASTY (Right)     Patient location during evaluation: PACU Anesthesia Type: General Level of consciousness: awake and alert Pain management: pain level controlled Vital Signs Assessment: post-procedure vital signs reviewed and stable Respiratory status: spontaneous breathing, nonlabored ventilation and respiratory function stable Cardiovascular status: blood pressure returned to baseline and stable Postop Assessment: no apparent nausea or vomiting Anesthetic complications: no   No notable events documented.  Last Vitals:  Vitals:   06/20/23 1100 06/20/23 1107  BP: (!) 129/94 126/89  Pulse: 81 81  Resp: 17 17  Temp:  (!) 36.4 C  SpO2: 94% 93%    Last Pain:  Vitals:   06/20/23 1107  TempSrc:   PainSc: 0-No pain                 Collene Schlichter

## 2023-06-22 ENCOUNTER — Other Ambulatory Visit: Payer: Self-pay | Admitting: Psychiatry

## 2023-06-23 ENCOUNTER — Other Ambulatory Visit: Payer: Self-pay | Admitting: Psychiatry

## 2023-06-23 ENCOUNTER — Other Ambulatory Visit (HOSPITAL_COMMUNITY): Payer: Self-pay

## 2023-06-23 MED ORDER — CLONIDINE HCL 0.1 MG PO TABS
0.1000 mg | ORAL_TABLET | Freq: Two times a day (BID) | ORAL | 0 refills | Status: DC
  Filled 2023-06-23: qty 180, 90d supply, fill #0
  Filled 2023-06-23: qty 60, 30d supply, fill #0
  Filled 2023-06-23: qty 180, 90d supply, fill #0

## 2023-06-26 ENCOUNTER — Other Ambulatory Visit (HOSPITAL_COMMUNITY): Payer: Self-pay | Admitting: Orthopedic Surgery

## 2023-06-27 ENCOUNTER — Other Ambulatory Visit: Payer: Self-pay | Admitting: Psychiatry

## 2023-06-27 ENCOUNTER — Other Ambulatory Visit (HOSPITAL_COMMUNITY): Payer: Self-pay

## 2023-06-27 DIAGNOSIS — F314 Bipolar disorder, current episode depressed, severe, without psychotic features: Secondary | ICD-10-CM

## 2023-06-27 MED ORDER — CLOZAPINE 25 MG PO TABS
ORAL_TABLET | ORAL | 0 refills | Status: DC
  Filled 2023-06-27: qty 32, 7d supply, fill #0

## 2023-06-27 MED ORDER — CLONIDINE HCL 0.1 MG PO TABS
0.2000 mg | ORAL_TABLET | Freq: Two times a day (BID) | ORAL | 0 refills | Status: DC
  Filled 2023-06-27: qty 120, 30d supply, fill #0

## 2023-06-28 ENCOUNTER — Other Ambulatory Visit (HOSPITAL_COMMUNITY): Payer: Self-pay

## 2023-06-29 ENCOUNTER — Emergency Department (HOSPITAL_COMMUNITY)

## 2023-06-29 ENCOUNTER — Emergency Department (HOSPITAL_COMMUNITY)
Admission: EM | Admit: 2023-06-29 | Discharge: 2023-06-29 | Disposition: A | Discharge status: Home / Self Care | Attending: Emergency Medicine | Admitting: Emergency Medicine

## 2023-06-29 ENCOUNTER — Other Ambulatory Visit (HOSPITAL_COMMUNITY): Payer: Self-pay

## 2023-06-29 ENCOUNTER — Encounter (HOSPITAL_COMMUNITY): Payer: Self-pay

## 2023-06-29 ENCOUNTER — Other Ambulatory Visit: Payer: Self-pay

## 2023-06-29 DIAGNOSIS — T50905A Adverse effect of unspecified drugs, medicaments and biological substances, initial encounter: Secondary | ICD-10-CM

## 2023-06-29 DIAGNOSIS — R918 Other nonspecific abnormal finding of lung field: Secondary | ICD-10-CM | POA: Diagnosis not present

## 2023-06-29 DIAGNOSIS — R079 Chest pain, unspecified: Secondary | ICD-10-CM | POA: Diagnosis not present

## 2023-06-29 DIAGNOSIS — R0789 Other chest pain: Secondary | ICD-10-CM | POA: Diagnosis not present

## 2023-06-29 DIAGNOSIS — I10 Essential (primary) hypertension: Secondary | ICD-10-CM | POA: Insufficient documentation

## 2023-06-29 DIAGNOSIS — Z79899 Other long term (current) drug therapy: Secondary | ICD-10-CM | POA: Insufficient documentation

## 2023-06-29 DIAGNOSIS — R109 Unspecified abdominal pain: Secondary | ICD-10-CM | POA: Diagnosis not present

## 2023-06-29 DIAGNOSIS — R000 Tachycardia, unspecified: Secondary | ICD-10-CM | POA: Diagnosis not present

## 2023-06-29 DIAGNOSIS — R1013 Epigastric pain: Secondary | ICD-10-CM | POA: Insufficient documentation

## 2023-06-29 DIAGNOSIS — M75121 Complete rotator cuff tear or rupture of right shoulder, not specified as traumatic: Secondary | ICD-10-CM | POA: Diagnosis not present

## 2023-06-29 DIAGNOSIS — R0989 Other specified symptoms and signs involving the circulatory and respiratory systems: Secondary | ICD-10-CM | POA: Diagnosis not present

## 2023-06-29 DIAGNOSIS — N3289 Other specified disorders of bladder: Secondary | ICD-10-CM | POA: Diagnosis not present

## 2023-06-29 DIAGNOSIS — R12 Heartburn: Secondary | ICD-10-CM | POA: Diagnosis present

## 2023-06-29 DIAGNOSIS — T43695A Adverse effect of other psychostimulants, initial encounter: Secondary | ICD-10-CM | POA: Diagnosis not present

## 2023-06-29 DIAGNOSIS — I2699 Other pulmonary embolism without acute cor pulmonale: Secondary | ICD-10-CM | POA: Diagnosis not present

## 2023-06-29 LAB — URINALYSIS, W/ REFLEX TO CULTURE (INFECTION SUSPECTED)
Bacteria, UA: NONE SEEN
Bilirubin Urine: NEGATIVE
Glucose, UA: NEGATIVE mg/dL
Hgb urine dipstick: NEGATIVE
Ketones, ur: NEGATIVE mg/dL
Leukocytes,Ua: NEGATIVE
Nitrite: NEGATIVE
Protein, ur: NEGATIVE mg/dL
Specific Gravity, Urine: 1.003 — ABNORMAL LOW (ref 1.005–1.030)
pH: 5 (ref 5.0–8.0)

## 2023-06-29 LAB — CBC WITH DIFFERENTIAL/PLATELET
Abs Immature Granulocytes: 0.11 10*3/uL — ABNORMAL HIGH (ref 0.00–0.07)
Basophils Absolute: 0.1 10*3/uL (ref 0.0–0.1)
Basophils Relative: 1 %
Eosinophils Absolute: 0.1 10*3/uL (ref 0.0–0.5)
Eosinophils Relative: 1 %
HCT: 44.7 % (ref 39.0–52.0)
Hemoglobin: 15 g/dL (ref 13.0–17.0)
Immature Granulocytes: 1 %
Lymphocytes Relative: 16 %
Lymphs Abs: 1.4 10*3/uL (ref 0.7–4.0)
MCH: 30.8 pg (ref 26.0–34.0)
MCHC: 33.6 g/dL (ref 30.0–36.0)
MCV: 91.8 fL (ref 80.0–100.0)
Monocytes Absolute: 0.8 10*3/uL (ref 0.1–1.0)
Monocytes Relative: 9 %
Neutro Abs: 6.3 10*3/uL (ref 1.7–7.7)
Neutrophils Relative %: 72 %
Platelets: 262 10*3/uL (ref 150–400)
RBC: 4.87 MIL/uL (ref 4.22–5.81)
RDW: 12.8 % (ref 11.5–15.5)
WBC: 8.8 10*3/uL (ref 4.0–10.5)
nRBC: 0 % (ref 0.0–0.2)

## 2023-06-29 LAB — COMPREHENSIVE METABOLIC PANEL WITH GFR
ALT: 134 U/L — ABNORMAL HIGH (ref 0–44)
AST: 101 U/L — ABNORMAL HIGH (ref 15–41)
Albumin: 4.1 g/dL (ref 3.5–5.0)
Alkaline Phosphatase: 46 U/L (ref 38–126)
Anion gap: 12 (ref 5–15)
BUN: 6 mg/dL (ref 6–20)
CO2: 17 mmol/L — ABNORMAL LOW (ref 22–32)
Calcium: 9.1 mg/dL (ref 8.9–10.3)
Chloride: 111 mmol/L (ref 98–111)
Creatinine, Ser: 0.82 mg/dL (ref 0.61–1.24)
GFR, Estimated: >60 mL/min (ref >60)
Glucose, Bld: 147 mg/dL — ABNORMAL HIGH (ref 70–99)
Potassium: 3.5 mmol/L (ref 3.5–5.1)
Sodium: 140 mmol/L (ref 135–145)
Total Bilirubin: 0.5 mg/dL (ref 0.0–1.2)
Total Protein: 7.2 g/dL (ref 6.5–8.1)

## 2023-06-29 LAB — I-STAT CG4 LACTIC ACID, ED
Lactic Acid, Venous: 4 mmol/L (ref 0.5–1.9)
Lactic Acid, Venous: 3.2 mmol/L (ref 0.5–1.9)

## 2023-06-29 LAB — I-STAT CHEM 8, ED
BUN: 4 mg/dL — ABNORMAL LOW (ref 6–20)
Calcium, Ion: 1.14 mmol/L — ABNORMAL LOW (ref 1.15–1.40)
Chloride: 113 mmol/L — ABNORMAL HIGH (ref 98–111)
Creatinine, Ser: 0.7 mg/dL (ref 0.61–1.24)
Glucose, Bld: 130 mg/dL — ABNORMAL HIGH (ref 70–99)
HCT: 46 % (ref 39.0–52.0)
Hemoglobin: 15.6 g/dL (ref 13.0–17.0)
Potassium: 3.4 mmol/L — ABNORMAL LOW (ref 3.5–5.1)
Sodium: 145 mmol/L (ref 135–145)
TCO2: 18 mmol/L — ABNORMAL LOW (ref 22–32)

## 2023-06-29 LAB — LIPASE, BLOOD: Lipase: 32 U/L (ref 11–51)

## 2023-06-29 LAB — TROPONIN I (HIGH SENSITIVITY)
Troponin I (High Sensitivity): 5 ng/L (ref <18)
Troponin I (High Sensitivity): 9 ng/L (ref <18)

## 2023-06-29 LAB — RAPID URINE DRUG SCREEN, HOSP PERFORMED
Amphetamines: POSITIVE — AB
Barbiturates: NOT DETECTED
Benzodiazepines: NOT DETECTED
Cocaine: NOT DETECTED
Opiates: NOT DETECTED
Tetrahydrocannabinol: POSITIVE — AB

## 2023-06-29 MED ORDER — LORAZEPAM 2 MG/ML IJ SOLN
1.0000 mg | Freq: Once | INTRAMUSCULAR | Status: AC
  Administered 2023-06-29: 1 mg via INTRAVENOUS
  Filled 2023-06-29: qty 1

## 2023-06-29 MED ORDER — SODIUM CHLORIDE 0.9 % IV SOLN: INTRAVENOUS | Status: DC

## 2023-06-29 MED ORDER — IOHEXOL 350 MG/ML SOLN
100.0000 mL | Freq: Once | INTRAVENOUS | Status: AC | PRN
  Administered 2023-06-29: 100 mL via INTRAVENOUS

## 2023-06-29 MED ORDER — SODIUM CHLORIDE 0.9 % IV BOLUS
1000.0000 mL | Freq: Once | INTRAVENOUS | Status: AC
  Administered 2023-06-29: 1000 mL via INTRAVENOUS

## 2023-06-29 NOTE — ED Provider Notes (Signed)
 [place] EMERGENCY DEPARTMENT AT [facility] Provider Note   CSN: [id number] Arrival date & time: 06/29/23  0844     History  Chief Complaint  Patient presents with   Chest Pain    Shawn Cervantes is a 53 y.o. male.  53 year old male presents with acute onset of heartburn which began yesterday evening.  Patient states he ate a great amount of food.  Notes that he has been slightly more short of breath.  Some exertional component to it.  Patient recently started on Clazuril a few days ago.  Denies any fevers or bodyaches.  No cough or congestion.  States his pain is in his mid abdomen and goes to his back.  He denies any alcohol or illicit drug use.  No change to his stools.  Was at physical therapy today according to his wife who is at bedside when he was found to be hypertensive.  Patient is on clonidine and has been compliant with that.  Was told to come in for further evaluation       Home Medications Prior to Admission medications   Medication Sig Start Date End Date Taking? Authorizing Provider  acetaminophen (TYLENOL) 325 MG tablet Take 650 mg by mouth 2 (two) times daily as needed for moderate pain.    [provider]  amphetamine-dextroamphetamine (ADDERALL XR) 30 MG 24 hr capsule Take 1 capsule (30 mg total) by mouth daily. Patient not taking: Reported on 06/16/2023 06/05/23   Cottle, Steva Ready., MD  amphetamine-dextroamphetamine (ADDERALL) 15 MG tablet Take 1 tablet (15 mg) by mouth daily in the afternoon. Patient not taking: Reported on 06/16/2023 03/24/23   Lauraine Rinne., MD  cariprazine (VRAYLAR) 1.5 MG capsule Take 1.5 mg by mouth daily.    [provider]  cloNIDine (CATAPRES) 0.1 MG tablet Take 2 tablets (0.2 mg total) by mouth 2 (two) times daily. 06/27/23   Cottle, Steva Ready., MD  cloZAPine (CLOZARIL) 25 MG tablet Take 3 tablets (75 mg total) by mouth every evening for 2 days, THEN 4 tablets (100 mg total) every evening for 2 days,  THEN 6 tablets (150 mg total) every evening for 3 days. 06/27/23 07/05/23  Cottle, Steva Ready., MD  gabapentin (NEURONTIN) 300 MG capsule Take 1 capsule (300 mg total) by mouth 4 (four) times daily. 06/19/23   Cottle, Steva Ready., MD  HYDROcodone-acetaminophen (NORCO/VICODIN) 5-325 MG tablet Take 1-2 tablets by mouth every 6 (six) hours as needed for moderate pain (pain score 4-6) or severe pain (pain score 7-10) (post op pain). 06/20/23 06/19/24  Elodia Florence, PA-C  ibuprofen (ADVIL,MOTRIN) 200 MG tablet Take 400 mg by mouth daily as needed for moderate pain. Patient not taking: Reported on 06/16/2023    [provider]  lidocaine (LIDODERM) 5 % Place 1-3 patches onto the skin as directed. Patient not taking: Reported on 06/16/2023 04/06/22     lithium carbonate (ESKALITH) 450 MG ER tablet Take 1 tablet (450 mg total) by mouth at bedtime. 06/19/23   Cottle, Steva Ready., MD  LORazepam (ATIVAN) 1 MG tablet Take 1 tablet (1 mg total) by mouth 2 (two) times daily as needed for anxiety AND 2 tablets (2 mg total) every evening. 06/16/23   Cottle, Steva Ready., MD  QUEtiapine (SEROQUEL) 100 MG tablet Take 1 tablet (100 mg total) by mouth at bedtime. Patient taking differently: Take 100 mg by mouth 2 (two) times daily. 06/05/23   Cottle, Steva Ready.,  MD  Solriamfetol HCl (SUNOSI) 150 MG TABS Take 1 tablet (150 mg total) by mouth every morning. Patient not taking: Reported on 06/16/2023 03/24/23   Cottle, Kennedy Peabody., MD  testosterone cypionate (DEPOTESTOSTERONE CYPIONATE) 200 MG/ML injection Inject 0.5 mLs (100 mg total) into the muscle once a week. 11/17/22     Vitamin D, Ergocalciferol, (DRISDOL) 1.25 MG (50000 UNIT) CAPS capsule Take 1 capsule (50,000 Units total) by mouth every 7 (seven) days. 02/17/23   Cottle, Kennedy Peabody., MD      Allergies    Trileptal [oxcarbazepine]    Review of Systems   Review of Systems  All other systems reviewed and are negative.   Physical Exam Updated Vital Signs BP (!) 175/130  (BP Location: Left Arm)   Pulse (!) 136   Temp 98.4 F (36.9 C) (Oral)   Resp 16   Ht 1.854 m (6\' 1" )   Wt 115.7 kg   SpO2 98%   BMI 33.64 kg/m  Physical Exam Vitals and nursing note reviewed.  Constitutional:      General: He is not in acute distress.    Appearance: Normal appearance. He is well-developed. He is not toxic-appearing.  HENT:     Head: Normocephalic and atraumatic.  Eyes:     General: Lids are normal.     Conjunctiva/sclera: Conjunctivae normal.     Pupils: Pupils are equal, round, and reactive to light.  Neck:     Thyroid: No thyroid mass.     Trachea: No tracheal deviation.  Cardiovascular:     Rate and Rhythm: Regular rhythm. Tachycardia present.     Heart sounds: Normal heart sounds. No murmur heard.    No gallop.  Pulmonary:     Effort: Pulmonary effort is normal. No respiratory distress.     Breath sounds: Normal breath sounds. No stridor. No decreased breath sounds, wheezing, rhonchi or rales.  Abdominal:     General: There is no distension.     Palpations: Abdomen is soft.     Tenderness: There is abdominal tenderness in the epigastric area. There is no rebound.    Musculoskeletal:        General: No tenderness. Normal range of motion.     Cervical back: Normal range of motion and neck supple.  Skin:    General: Skin is warm and dry.     Findings: No abrasion or rash.  Neurological:     Mental Status: He is alert and oriented to person, place, and time. Mental status is at baseline.     GCS: GCS eye subscore is 4. GCS verbal subscore is 5. GCS motor subscore is 6.     Cranial Nerves: No cranial nerve deficit.     Sensory: No sensory deficit.     Motor: Motor function is intact.  Psychiatric:        Attention and Perception: Attention normal.        Speech: Speech normal.        Behavior: Behavior normal.     ED Results / Procedures / Treatments   Labs (all labs ordered are listed, but only abnormal results are displayed) Labs Reviewed   COMPREHENSIVE METABOLIC PANEL WITH GFR  LIPASE, BLOOD  URINALYSIS, W/ REFLEX TO CULTURE (INFECTION SUSPECTED)  CBC WITH DIFFERENTIAL/PLATELET  I-STAT CHEM 8, ED  I-STAT CG4 LACTIC ACID, ED  TROPONIN I (HIGH SENSITIVITY)    EKG EKG Interpretation Date/Time:  Thursday June 29 2023 08:47:55 EDT Ventricular Rate:  134 PR  Interval:  148 QRS Duration:  98 QT Interval:  273 QTC Calculation: 408 R Axis:   36  Text Interpretation: Sinus tachycardia RSR' in V1 or V2, right VCD or RVH Repol abnrm suggests ischemia, diffuse leads Confirmed by Lind Repine ([id number]) on 06/29/2023 9:08:21 AM  Radiology No results found.  Procedures Procedures    Medications Ordered in ED Medications  0.9 %  sodium chloride infusion (has no administration in time range)  sodium chloride 0.9 % bolus 1,000 mL (has no administration in time range)    ED Course/ Medical Decision Making/ A&P                                 Medical Decision Making Amount and/or Complexity of Data Reviewed Labs: ordered. Radiology: ordered.  Risk Prescription drug management.   Patient's EKG shows sinus tachycardia.  Patient reporting severe abdominal and chest discomfort.  Elevated lactate noted here.  Patient had chest x-ray which showed no acute findings.  Given IV fluids.  Concern for possible PE versus dissection patient has CT abdomen and chest which showed no new findings.  First lactate elevated and second 1 did decrease after IV fluids.  Patient also very anxious and patient given Ativan x 2 and heart rate and blood pressure did improve.  Upon further questioning, patient admits to taking Adderall as well as another stimulant as well as drinking caffeine today.  States that he has not used these medications for several weeks.  Feel that this is the likely etiology of his hypertension as well as tachycardia.  Troponins x 2 were negative.  Low suspicion for ACS.  Did have an enlarged bladder on CAT scan although  patient states he did have a good urination.  Will check bladder scan here.  Patient offered inpatient admission for further evaluation and observation he has declined.  Wife at bedside and agrees with this.  Patient informed to avoid all stimulants at this time  CRITICAL CARE Performed by: Eden Goodpasture Total critical care time: 75 minutes Critical care time was exclusive of separately billable procedures and treating other patients. Critical care was necessary to treat or prevent imminent or life-threatening deterioration. Critical care was time spent personally by me on the following activities: development of treatment plan with patient and/or surrogate as well as nursing, discussions with consultants, evaluation of patient's response to treatment, examination of patient, obtaining history from patient or surrogate, ordering and performing treatments and interventions, ordering and review of laboratory studies, ordering and review of radiographic studies, pulse oximetry and re-evaluation of patient's condition.        Final Clinical Impression(s) / ED Diagnoses Final diagnoses:  None    Rx / DC Orders ED Discharge Orders     None         Lind Repine, MD 06/29/23 1433

## 2023-06-29 NOTE — ED Triage Notes (Signed)
 States that he feels like he has heart burn. Symptoms intermittently since 2230 last evening. Hx of HTN. Denies cardiac history. Right shoulder surgery last Tues, April 8.Denies n/v/d

## 2023-06-29 NOTE — ED Notes (Signed)
 Pt's BP starting to elevate. Pt is alert and oriented, but is diaphoretic. Did and EKG and informed Dr Leighton Punches.

## 2023-06-29 NOTE — ED Notes (Signed)
 I stat lactic given to Leighton Punches MD and RN 4.01

## 2023-06-29 NOTE — ED Notes (Signed)
 Pt aware urine sample is needed. Urinal @bedside .

## 2023-06-29 NOTE — Discharge Instructions (Signed)
 Avoid using all stimulants including caffeine and your Adderall.  Return here if you develop worsening palpitations severe headache.  Follow-up with your doctor next week

## 2023-07-04 LAB — CULTURE, BLOOD (ROUTINE X 2): Culture: NO GROWTH

## 2023-07-05 ENCOUNTER — Other Ambulatory Visit: Payer: Self-pay | Admitting: Psychiatry

## 2023-07-05 ENCOUNTER — Encounter: Payer: Self-pay | Admitting: Psychiatry

## 2023-07-05 NOTE — Progress Notes (Signed)
 DC quetiapine .  Increase clozapine 

## 2023-07-05 NOTE — Progress Notes (Signed)
 Disc case with wife:   Currently Lithium  Depakote  1000 mg HS Clonidine  0.1 mg BID and 0.1 mg twice daily prn Clozapine  50 mg HS Ativan  2 mg BID.  Plan: increase clozapine  100 mg HS and then to 150 mg HS asap.  It will not help at lower dose than that. Spread out Ativan  1 mg QID. Increase clonidine  to 0.2 mg BID and even 0.3 mg BID if BP and anxiety are still high.  Nori Beat, MD, DFAPA

## 2023-07-06 ENCOUNTER — Other Ambulatory Visit (HOSPITAL_COMMUNITY): Payer: Self-pay

## 2023-07-06 ENCOUNTER — Other Ambulatory Visit: Payer: Self-pay | Admitting: Psychiatry

## 2023-07-06 DIAGNOSIS — F314 Bipolar disorder, current episode depressed, severe, without psychotic features: Secondary | ICD-10-CM

## 2023-07-06 MED ORDER — CLOZAPINE 50 MG PO TABS
150.0000 mg | ORAL_TABLET | Freq: Every evening | ORAL | 11 refills | Status: DC
  Filled 2023-07-06: qty 21, 7d supply, fill #0
  Filled 2023-07-13 (×2): qty 21, 7d supply, fill #1
  Filled 2023-07-19: qty 21, 7d supply, fill #2

## 2023-07-07 ENCOUNTER — Other Ambulatory Visit (HOSPITAL_COMMUNITY): Payer: Self-pay

## 2023-07-07 DIAGNOSIS — Z79899 Other long term (current) drug therapy: Secondary | ICD-10-CM | POA: Diagnosis not present

## 2023-07-07 DIAGNOSIS — F314 Bipolar disorder, current episode depressed, severe, without psychotic features: Secondary | ICD-10-CM | POA: Diagnosis not present

## 2023-07-08 LAB — CBC WITH DIFFERENTIAL/PLATELET
Basophils Absolute: 0.1 10*3/uL (ref 0.0–0.2)
Basos: 1 %
EOS (ABSOLUTE): 0.4 10*3/uL (ref 0.0–0.4)
Eos: 4 %
Hematocrit: 47.5 % (ref 37.5–51.0)
Hemoglobin: 16.8 g/dL (ref 13.0–17.7)
Immature Grans (Abs): 0.1 10*3/uL (ref 0.0–0.1)
Immature Granulocytes: 2 %
Lymphocytes Absolute: 1.7 10*3/uL (ref 0.7–3.1)
Lymphs: 19 %
MCH: 31.5 pg (ref 26.6–33.0)
MCHC: 35.4 g/dL (ref 31.5–35.7)
MCV: 89 fL (ref 79–97)
Monocytes Absolute: 0.7 10*3/uL (ref 0.1–0.9)
Monocytes: 7 %
Neutrophils Absolute: 5.9 10*3/uL (ref 1.4–7.0)
Neutrophils: 67 %
Platelets: 345 10*3/uL (ref 150–450)
RBC: 5.34 x10E6/uL (ref 4.14–5.80)
RDW: 13 % (ref 11.6–15.4)
WBC: 8.9 10*3/uL (ref 3.4–10.8)

## 2023-07-10 ENCOUNTER — Other Ambulatory Visit: Payer: Self-pay | Admitting: Psychiatry

## 2023-07-10 NOTE — Progress Notes (Signed)
 Disc with W Sleeping 20 hour or so daily on clozapine  150 mg .  Just got to that dose. Not taking excessive Ativan .   Plan:  needs at least this dose for TR sx.  Will add Sunosi  and hold Adderall.   Nori Beat, MD, DFAPA

## 2023-07-12 ENCOUNTER — Other Ambulatory Visit (HOSPITAL_COMMUNITY): Payer: Self-pay

## 2023-07-13 ENCOUNTER — Other Ambulatory Visit (HOSPITAL_COMMUNITY): Payer: Self-pay

## 2023-07-14 ENCOUNTER — Other Ambulatory Visit (HOSPITAL_COMMUNITY): Payer: Self-pay

## 2023-07-17 DIAGNOSIS — M75121 Complete rotator cuff tear or rupture of right shoulder, not specified as traumatic: Secondary | ICD-10-CM | POA: Diagnosis not present

## 2023-07-19 ENCOUNTER — Other Ambulatory Visit: Payer: Self-pay | Admitting: Psychiatry

## 2023-07-19 ENCOUNTER — Other Ambulatory Visit (HOSPITAL_COMMUNITY): Payer: Self-pay

## 2023-07-19 ENCOUNTER — Encounter (HOSPITAL_COMMUNITY): Payer: Self-pay

## 2023-07-19 DIAGNOSIS — M75121 Complete rotator cuff tear or rupture of right shoulder, not specified as traumatic: Secondary | ICD-10-CM | POA: Diagnosis not present

## 2023-07-19 DIAGNOSIS — F411 Generalized anxiety disorder: Secondary | ICD-10-CM

## 2023-07-19 DIAGNOSIS — F314 Bipolar disorder, current episode depressed, severe, without psychotic features: Secondary | ICD-10-CM

## 2023-07-19 MED ORDER — LORAZEPAM 1 MG PO TABS
ORAL_TABLET | ORAL | 0 refills | Status: DC
  Filled 2023-07-19: qty 50, 13d supply, fill #0

## 2023-07-19 MED ORDER — CLOZAPINE 50 MG PO TABS
250.0000 mg | ORAL_TABLET | Freq: Every evening | ORAL | 1 refills | Status: DC
  Filled 2023-07-19 – 2023-07-24 (×2): qty 35, 7d supply, fill #0

## 2023-07-20 ENCOUNTER — Other Ambulatory Visit: Payer: Self-pay

## 2023-07-20 ENCOUNTER — Other Ambulatory Visit (HOSPITAL_COMMUNITY): Payer: Self-pay

## 2023-07-20 ENCOUNTER — Other Ambulatory Visit: Payer: Self-pay | Admitting: Psychiatry

## 2023-07-20 MED ORDER — CLONIDINE HCL 0.1 MG PO TABS
0.2000 mg | ORAL_TABLET | Freq: Two times a day (BID) | ORAL | 1 refills | Status: DC
  Filled 2023-07-20 – 2023-07-21 (×3): qty 120, 30d supply, fill #0

## 2023-07-21 ENCOUNTER — Other Ambulatory Visit (HOSPITAL_COMMUNITY): Payer: Self-pay | Admitting: Orthopedic Surgery

## 2023-07-21 ENCOUNTER — Other Ambulatory Visit (HOSPITAL_COMMUNITY): Payer: Self-pay

## 2023-07-21 DIAGNOSIS — N528 Other male erectile dysfunction: Secondary | ICD-10-CM | POA: Diagnosis not present

## 2023-07-24 ENCOUNTER — Other Ambulatory Visit (HOSPITAL_COMMUNITY): Payer: Self-pay

## 2023-07-24 ENCOUNTER — Other Ambulatory Visit: Payer: Self-pay

## 2023-07-24 DIAGNOSIS — F314 Bipolar disorder, current episode depressed, severe, without psychotic features: Secondary | ICD-10-CM

## 2023-07-24 MED ORDER — CLOZAPINE 50 MG PO TABS
250.0000 mg | ORAL_TABLET | Freq: Every evening | ORAL | 1 refills | Status: DC
Start: 2023-07-24 — End: 2023-07-27
  Filled 2023-07-24: qty 35, 7d supply, fill #0

## 2023-07-25 ENCOUNTER — Other Ambulatory Visit (HOSPITAL_COMMUNITY): Payer: Self-pay

## 2023-07-26 ENCOUNTER — Other Ambulatory Visit (HOSPITAL_COMMUNITY): Payer: Self-pay

## 2023-07-26 ENCOUNTER — Other Ambulatory Visit: Payer: Self-pay | Admitting: Psychiatry

## 2023-07-26 DIAGNOSIS — Z79899 Other long term (current) drug therapy: Secondary | ICD-10-CM | POA: Diagnosis not present

## 2023-07-26 DIAGNOSIS — F314 Bipolar disorder, current episode depressed, severe, without psychotic features: Secondary | ICD-10-CM | POA: Diagnosis not present

## 2023-07-26 MED ORDER — HYDROCODONE-ACETAMINOPHEN 5-325 MG PO TABS
1.0000 | ORAL_TABLET | Freq: Three times a day (TID) | ORAL | 0 refills | Status: DC
  Filled 2023-07-26: qty 20, 7d supply, fill #0

## 2023-07-27 ENCOUNTER — Other Ambulatory Visit (HOSPITAL_COMMUNITY): Payer: Self-pay

## 2023-07-27 ENCOUNTER — Other Ambulatory Visit: Payer: Self-pay

## 2023-07-27 DIAGNOSIS — F314 Bipolar disorder, current episode depressed, severe, without psychotic features: Secondary | ICD-10-CM

## 2023-07-27 LAB — CBC WITH DIFFERENTIAL/PLATELET
Basophils Absolute: 0.1 10*3/uL (ref 0.0–0.2)
Basos: 1 %
EOS (ABSOLUTE): 0.4 10*3/uL (ref 0.0–0.4)
Eos: 4 %
Hematocrit: 45 % (ref 37.5–51.0)
Hemoglobin: 15.2 g/dL (ref 13.0–17.7)
Immature Grans (Abs): 0.1 10*3/uL (ref 0.0–0.1)
Immature Granulocytes: 1 %
Lymphocytes Absolute: 2.3 10*3/uL (ref 0.7–3.1)
Lymphs: 27 %
MCH: 30.4 pg (ref 26.6–33.0)
MCHC: 33.8 g/dL (ref 31.5–35.7)
MCV: 90 fL (ref 79–97)
Monocytes Absolute: 0.5 10*3/uL (ref 0.1–0.9)
Monocytes: 6 %
Neutrophils Absolute: 5.1 10*3/uL (ref 1.4–7.0)
Neutrophils: 61 %
Platelets: 244 10*3/uL (ref 150–450)
RBC: 5 x10E6/uL (ref 4.14–5.80)
RDW: 13.1 % (ref 11.6–15.4)
WBC: 8.4 10*3/uL (ref 3.4–10.8)

## 2023-07-27 MED ORDER — ATROPINE SULFATE 1 % OP SOLN
1.0000 [drp] | Freq: Three times a day (TID) | OPHTHALMIC | 1 refills | Status: DC
  Filled 2023-07-27: qty 5, 34d supply, fill #0

## 2023-07-27 MED ORDER — CLOZAPINE 100 MG PO TABS
300.0000 mg | ORAL_TABLET | Freq: Every day | ORAL | 2 refills | Status: DC
Start: 2023-07-27 — End: 2023-08-18
  Filled 2023-07-27: qty 25, 8d supply, fill #0

## 2023-07-28 ENCOUNTER — Other Ambulatory Visit: Payer: Self-pay | Admitting: Psychiatry

## 2023-07-28 ENCOUNTER — Other Ambulatory Visit: Payer: Self-pay

## 2023-07-28 ENCOUNTER — Other Ambulatory Visit (HOSPITAL_COMMUNITY): Payer: Self-pay

## 2023-07-28 DIAGNOSIS — F411 Generalized anxiety disorder: Secondary | ICD-10-CM

## 2023-07-28 DIAGNOSIS — M75121 Complete rotator cuff tear or rupture of right shoulder, not specified as traumatic: Secondary | ICD-10-CM | POA: Diagnosis not present

## 2023-07-28 MED ORDER — GABAPENTIN 300 MG PO CAPS
300.0000 mg | ORAL_CAPSULE | Freq: Four times a day (QID) | ORAL | 0 refills | Status: DC
  Filled 2023-07-28 – 2023-08-10 (×2): qty 120, 30d supply, fill #0

## 2023-07-30 ENCOUNTER — Emergency Department (HOSPITAL_COMMUNITY)

## 2023-07-30 ENCOUNTER — Other Ambulatory Visit: Payer: Self-pay

## 2023-07-30 ENCOUNTER — Emergency Department (HOSPITAL_COMMUNITY)
Admission: EM | Admit: 2023-07-30 | Discharge: 2023-07-30 | Disposition: A | Discharge status: Home / Self Care | Attending: Emergency Medicine | Admitting: Emergency Medicine

## 2023-07-30 DIAGNOSIS — R471 Dysarthria and anarthria: Secondary | ICD-10-CM

## 2023-07-30 DIAGNOSIS — G43909 Migraine, unspecified, not intractable, without status migrainosus: Secondary | ICD-10-CM | POA: Diagnosis not present

## 2023-07-30 DIAGNOSIS — R519 Headache, unspecified: Secondary | ICD-10-CM | POA: Insufficient documentation

## 2023-07-30 DIAGNOSIS — R000 Tachycardia, unspecified: Secondary | ICD-10-CM | POA: Diagnosis not present

## 2023-07-30 DIAGNOSIS — R4701 Aphasia: Secondary | ICD-10-CM | POA: Insufficient documentation

## 2023-07-30 DIAGNOSIS — R0689 Other abnormalities of breathing: Secondary | ICD-10-CM | POA: Diagnosis not present

## 2023-07-30 DIAGNOSIS — R531 Weakness: Secondary | ICD-10-CM | POA: Diagnosis not present

## 2023-07-30 DIAGNOSIS — R61 Generalized hyperhidrosis: Secondary | ICD-10-CM | POA: Diagnosis not present

## 2023-07-30 DIAGNOSIS — R29818 Other symptoms and signs involving the nervous system: Secondary | ICD-10-CM | POA: Diagnosis not present

## 2023-07-30 LAB — I-STAT CHEM 8, ED
BUN: 17 mg/dL (ref 6–20)
Calcium, Ion: 1.17 mmol/L (ref 1.15–1.40)
Chloride: 102 mmol/L (ref 98–111)
Creatinine, Ser: 1.1 mg/dL (ref 0.61–1.24)
Glucose, Bld: 121 mg/dL — ABNORMAL HIGH (ref 70–99)
HCT: 45 % (ref 39.0–52.0)
Hemoglobin: 15.3 g/dL (ref 13.0–17.0)
Potassium: 3.5 mmol/L (ref 3.5–5.1)
Sodium: 139 mmol/L (ref 135–145)
TCO2: 22 mmol/L (ref 22–32)

## 2023-07-30 LAB — COMPREHENSIVE METABOLIC PANEL WITH GFR
ALT: 22 U/L (ref 0–44)
AST: 26 U/L (ref 15–41)
Albumin: 4.2 g/dL (ref 3.5–5.0)
Alkaline Phosphatase: 42 U/L (ref 38–126)
Anion gap: 12 (ref 5–15)
BUN: 15 mg/dL (ref 6–20)
CO2: 21 mmol/L — ABNORMAL LOW (ref 22–32)
Calcium: 9.2 mg/dL (ref 8.9–10.3)
Chloride: 104 mmol/L (ref 98–111)
Creatinine, Ser: 1.04 mg/dL (ref 0.61–1.24)
GFR, Estimated: >60 mL/min (ref >60)
Glucose, Bld: 124 mg/dL — ABNORMAL HIGH (ref 70–99)
Potassium: 3.5 mmol/L (ref 3.5–5.1)
Sodium: 137 mmol/L (ref 135–145)
Total Bilirubin: 1.2 mg/dL (ref 0.0–1.2)
Total Protein: 6.9 g/dL (ref 6.5–8.1)

## 2023-07-30 LAB — CBC
HCT: 44.6 % (ref 39.0–52.0)
Hemoglobin: 15.7 g/dL (ref 13.0–17.0)
MCH: 30.9 pg (ref 26.0–34.0)
MCHC: 35.2 g/dL (ref 30.0–36.0)
MCV: 87.8 fL (ref 80.0–100.0)
Platelets: 228 10*3/uL (ref 150–400)
RBC: 5.08 MIL/uL (ref 4.22–5.81)
RDW: 12.8 % (ref 11.5–15.5)
WBC: 8.3 10*3/uL (ref 4.0–10.5)
nRBC: 0 % (ref 0.0–0.2)

## 2023-07-30 LAB — DIFFERENTIAL
Abs Immature Granulocytes: 0.05 10*3/uL (ref 0.00–0.07)
Basophils Absolute: 0.1 10*3/uL (ref 0.0–0.1)
Basophils Relative: 1 %
Eosinophils Absolute: 0.4 10*3/uL (ref 0.0–0.5)
Eosinophils Relative: 5 %
Immature Granulocytes: 1 %
Lymphocytes Relative: 32 %
Lymphs Abs: 2.7 10*3/uL (ref 0.7–4.0)
Monocytes Absolute: 0.6 10*3/uL (ref 0.1–1.0)
Monocytes Relative: 7 %
Neutro Abs: 4.5 10*3/uL (ref 1.7–7.7)
Neutrophils Relative %: 54 %

## 2023-07-30 LAB — PROTIME-INR
INR: 1.1 (ref 0.8–1.2)
Prothrombin Time: 14.3 s (ref 11.4–15.2)

## 2023-07-30 LAB — APTT: aPTT: 25 s (ref 24–36)

## 2023-07-30 LAB — ETHANOL: Alcohol, Ethyl (B): <15 mg/dL (ref <15)

## 2023-07-30 MED ORDER — KETOROLAC TROMETHAMINE 30 MG/ML IJ SOLN
15.0000 mg | Freq: Once | INTRAMUSCULAR | Status: AC
  Administered 2023-07-30: 15 mg via INTRAVENOUS
  Filled 2023-07-30: qty 1

## 2023-07-30 MED ORDER — IOHEXOL 350 MG/ML SOLN
100.0000 mL | Freq: Once | INTRAVENOUS | Status: AC | PRN
  Administered 2023-07-30: 100 mL via INTRAVENOUS

## 2023-07-30 MED ORDER — DEXTROSE 5 % IV SOLN
1000.0000 mg | Freq: Once | INTRAVENOUS | Status: AC
  Administered 2023-07-30: 1000 mg via INTRAVENOUS
  Filled 2023-07-30: qty 10

## 2023-07-30 NOTE — ED Triage Notes (Signed)
 Pt arrived via [facility] from home for code stroke. Ems initially called for allergic reaction, pt found to be aphasic, agitated, confused. Code stroke activated PTA. LMW 0800 5/17. No extremity weakness, facial droop. Wife reports similar presentation to medication in the past.   PTA EMS Vitals  BP 136/90 HR 103 RR 26 SPO2 96% CBG 120

## 2023-07-30 NOTE — Consult Note (Signed)
 NEUROLOGY CONSULT NOTE   Date of service: Jul 30, 2023 Patient Name: Shawn Cervantes MRN:  [id number] DOB:  17-Nov-1970 Chief Complaint: "Difficulty speaking" Requesting Provider: Eve Hinders, MD  History of Present Illness  Shawn Cervantes is a 53 y.o. male with hx of migraine who presents with confusion.  He states that he noticed symptoms since he woke up at 8 AM on 5/17, his wife states that he was still speaking relatively normally but complaining of headache throughout the day.  And then in the evening, he woke up much more confused and therefore called 911.  EMS correctly identified his confusion as aphasia and activated a code stroke.  LKW: 8 AM Modified rankin score: 0-Completely asymptomatic and back to baseline post- stroke IV Thrombolysis: No, outside of window   NIHSS components Score: Comment  1a Level of Conscious 0[]  1[]  2[]  3[]      1b LOC Questions 0[]  1[]  2[]       1c LOC Commands 0[]  1[]  2[]       2 Best Gaze 0[]  1[]  2[]       3 Visual 0[]  1[]  2[]  3[]      4 Facial Palsy 0[]  1[]  2[]  3[]      5a Motor Arm - left 0[]  1[]  2[]  3[]  4[]  UN[]    5b Motor Arm - Right 0[]  1[x]  2[]  3[]  4[]  UN[]    6a Motor Leg - Left 0[]  1[]  2[]  3[]  4[]  UN[]    6b Motor Leg - Right 0[]  1[]  2[]  3[]  4[]  UN[]    7 Limb Ataxia 0[]  1[]  2[]  UN[]      8 Sensory 0[]  1[]  2[]  UN[]      9 Best Language 0[]  1[x]  2[]  3[]      10 Dysarthria 0[]  1[]  2[]  UN[]      11 Extinct. and Inattention 0[]  1[]  2[]       TOTAL: 2      Past History   Past Medical History:  Diagnosis Date   ADHD (attention deficit hyperactivity disorder)    Anxiety    Arthritis    knee and back   Bipolar 1 disorder (HCC)    Complication of anesthesia    Constipation    Depression    bi polar   GERD (gastroesophageal reflux disease)    Head injury    as a child ;had stitches in head   Headache    migraines   Hypertension    Mental disorder    Bipolar   PONV (postoperative nausea and vomiting)    Sleep apnea    wears Cpap    Testosterone  deficiency    Trigeminal neuralgia of left side of face 04/01/2019   Left V2    Past Surgical History:  Procedure Laterality Date   BACK SURGERY  2021   replaced stimulator/leads   HAND SURGERY Left    tendon repai x2   LUMBAR LAMINECTOMY Right 04/01/2013   Procedure: MICRODISCECTOMY LUMBAR LAMINECTOMY;  Surgeon: Adah Acron, MD;  Location: [facility] OR;  Service: Orthopedics;  Laterality: Right;  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   LUMBAR WOUND DEBRIDEMENT N/A 07/30/2014   Procedure: LUMBAR WOUND IRRIGATION AND DEBRIDEMENT,        ;  Surgeon: Isadora Mar, MD;  Location: [facility];  Service: Neurosurgery;  Laterality: N/A;   MICRODISCECTOMY LUMBAR     L4-5   MICRODISCECTOMY LUMBAR     L4-5   POSTERIOR LUMBAR FUSION 2 WITH HARDWARE REMOVAL Right 06/20/2023   Procedure: ARTHROSCOPY, SHOULDER WITH DEBRIDEMENT AND CHROMIOPLASTY;  Surgeon: Dayne Even, MD;  Location: [facility];  Service: Orthopedics;  Laterality: Right;   SPINAL CORD STIMULATOR INSERTION N/A 09/25/2015   Procedure: LUMBAR SPINAL CORD STIMULATOR INSERTION;  Surgeon: Gerri Kras, MD;  Location: [facility];  Service: Neurosurgery;  Laterality: N/A;  LUMBAR SPINAL CORD STIMULATOR INSERTION   SUBACROMIAL DECOMPRESSION Right 06/20/2023   Procedure: DECOMPRESSION, SUBACROMIAL SPACE;  Surgeon: Dayne Even, MD;  Location: [facility];  Service: Orthopedics;  Laterality: Right;  RIGHT SHOULDER ARTHROSCOPY   TRANSFORAMINAL LUMBAR INTERBODY FUSION (TLIF) WITH PEDICLE SCREW FIXATION 1 LEVEL N/A 07/17/2014   Procedure: Transforaminal Lumbar Interbody Fusion Lumbar four-five with pedicle screws  ;  Surgeon: Isadora Mar, MD;  Location: [facility];  Service: Neurosurgery;  Laterality: N/A;   VASECTOMY     WRIST ARTHROSCOPY     WRIST SURGERY Right    reconstution    Family History: Family History  Problem Relation Age of Onset   Hypertension Mother    Depression Mother    Anxiety disorder Mother     Alcoholism Father    Diabetes Father     Social History  reports that he quit smoking about 9 years ago. His smoking use included cigars and cigarettes. He has never used smokeless tobacco. He reports that he does not drink alcohol and does not use drugs.  Allergies  Allergen Reactions   Trileptal [Oxcarbazepine] Other (See Comments)    Low sodium    Medications  No current facility-administered medications for this encounter.  Current Outpatient Medications:    acetaminophen  (TYLENOL ) 325 MG tablet, Take 650 mg by mouth 2 (two) times daily as needed for moderate pain., Disp: , Rfl:    amphetamine -dextroamphetamine  (ADDERALL XR) 30 MG 24 hr capsule, Take 1 capsule (30 mg total) by mouth daily. (Patient not taking: Reported on 06/16/2023), Disp: 30 capsule, Rfl: 0   amphetamine -dextroamphetamine  (ADDERALL) 15 MG tablet, Take 1 tablet (15 mg) by mouth daily in the afternoon. (Patient not taking: Reported on 06/16/2023), Disp: 30 tablet, Rfl: 0   atropine  1 % ophthalmic solution, Place 1 drop under the tongue 3 (three) times daily., Disp: 5 mL, Rfl: 1   cloNIDine  (CATAPRES ) 0.1 MG tablet, Take 2 tablets (0.2 mg total) by mouth 2 (two) times daily., Disp: 120 tablet, Rfl: 1   cloZAPine  (CLOZARIL ) 100 MG tablet, Take 3 tablets (300 mg total) by mouth at bedtime., Disp: 25 tablet, Rfl: 2   gabapentin  (NEURONTIN ) 300 MG capsule, Take 1 capsule (300 mg total) by mouth 4 (four) times daily., Disp: 120 capsule, Rfl: 0   HYDROcodone -acetaminophen  (NORCO/VICODIN) 5-325 MG tablet, Take 1-2 tablets by mouth every 6 (six) hours as needed for moderate pain (pain score 4-6) or severe pain (pain score 7-10) (post op pain)., Disp: 15 tablet, Rfl: 0   HYDROcodone -acetaminophen  (NORCO/VICODIN) 5-325 MG tablet, Take 1 tablet by mouth every 8 to 12 hours as needed for post op pain., Disp: 20 tablet, Rfl: 0   ibuprofen (ADVIL,MOTRIN) 200 MG tablet, Take 400 mg by mouth daily as needed for moderate pain. (Patient not  taking: Reported on 06/16/2023), Disp: , Rfl:    lidocaine  (LIDODERM ) 5 %, Place 1-3 patches onto the skin as directed. (Patient not taking: Reported on 06/16/2023), Disp: 270 patch, Rfl: 0   lithium  carbonate (ESKALITH ) 450 MG ER tablet, Take 1 tablet (450 mg total) by mouth at bedtime., Disp: 90 tablet, Rfl: 0   LORazepam  (ATIVAN ) 1 MG tablet, Take 1 tablet (1 mg total) by mouth 2 (  two) times daily as needed for anxiety AND 2 tablets (2 mg total) every evening., Disp: 50 tablet, Rfl: 0   Solriamfetol  HCl (SUNOSI ) 150 MG TABS, Take 1 tablet (150 mg total) by mouth every morning. (Patient not taking: Reported on 06/16/2023), Disp: 30 tablet, Rfl: 1   testosterone  cypionate (DEPOTESTOSTERONE CYPIONATE) 200 MG/ML injection, Inject 0.5 mLs (100 mg total) into the muscle once a week., Disp: 10 mL, Rfl: 5   Vitamin D , Ergocalciferol , (DRISDOL ) 1.25 MG (50000 UNIT) CAPS capsule, Take 1 capsule (50,000 Units total) by mouth every 7 (seven) days., Disp: 15 capsule, Rfl: 1  Vitals   Vitals:   07/30/23 0145  BP: 134/76  Pulse: (!) 102  Resp: 20  SpO2: 98%    There is no height or weight on file to calculate BMI.  Physical Exam   Constitutional: Appears well-developed and well-nourished.  Neurologic Examination    Neuro: Mental Status: Patient is awake, alert, oriented to person, place, month, year, and situation. Patient is able to give a clear and coherent history. He has repeated errors when he is trying to speak, and limitations in his fluency. Cranial Nerves: II: Visual Fields are full. Pupils are equal, round, and reactive to light.   III,IV, VI: EOMI without ptosis or diploplia.  V: Facial sensation is symmetric to temperature VII: Facial movement is symmetric.  VIII: hearing is intact to voice X: Uvula elevates symmetrically XII: tongue is midline without atrophy or fasciculations.  Motor: He has good strength confrontation, though he does drift when I checked his right arm, I am not  sure if this is effort/comprehension related Sensory: Sensation is symmetric to light touch and temperature in the arms and legs. Cerebellar: FNF and HKS are intact bilaterally        Labs/Imaging/Neurodiagnostic studies   CBC:  Recent Labs  Lab Jul 30, 2023 1531 07/30/23 0141 07/30/23 0145  WBC 8.4 8.3  --   NEUTROABS 5.1 4.5  --   HGB 15.2 15.7 15.3  HCT 45.0 44.6 45.0  MCV 90 87.8  --   PLT 244 228  --    Basic Metabolic Panel:  Lab Results  Component Value Date   NA 139 07/30/2023   K 3.5 07/30/2023   CO2 17 (L) 06/29/2023   GLUCOSE 121 (H) 07/30/2023   BUN 17 07/30/2023   CREATININE 1.10 07/30/2023   CALCIUM 9.1 06/29/2023   GFRNONAA >60 06/29/2023   GFRAA >60 08/02/2016   Lipid Panel: No results found for: "LDLCALC" HgbA1c: No results found for: "HGBA1C" Urine Drug Screen:     Component Value Date/Time   LABOPIA NONE DETECTED 06/29/2023 1141   COCAINSCRNUR NONE DETECTED 06/29/2023 1141   LABBENZ NONE DETECTED 06/29/2023 1141   AMPHETMU POSITIVE (A) 06/29/2023 1141   THCU POSITIVE (A) 06/29/2023 1141   LABBARB NONE DETECTED 06/29/2023 1141    Alcohol Level     Component Value Date/Time   ETH <5 07/20/2015 2310   INR  Lab Results  Component Value Date   INR 1.06 07/15/2014     CT angio Head and Neck with contrast(Personally reviewed): Negative   ASSESSMENT   Shawn Cervantes is a 53 y.o. male with difficulty speaking in the setting of a significant headache.  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  He will need to be evaluated for this with MRI.  Another option would be treating for migraine, and if his  symptoms improved with treatment, then this will be strongly supportive of migraine.  He is already on clozapine  and his QTc is slightly prolonged, so I would favor holding off on Reglan.  I will use Toradol /Depakote  instead  RECOMMENDATIONS   Toradol  30 mg x 1 Depacon 1 g x 1 MRI brain Further workup if MRI is positive. ______________________________________________________________________    Flint Hummer, MD [facility]

## 2023-07-30 NOTE — ED Provider Notes (Signed)
 [place] EMERGENCY DEPARTMENT AT [facility] Provider Note   CSN: [id number] Arrival date & time: 07/30/23  7829  An emergency department physician performed an initial assessment on this suspected stroke patient at 0138.  History  Chief Complaint  Patient presents with   Code Stroke    Shawn Cervantes is a 53 y.o. male.  53 year old male presents ER today as an activated code stroke secondary to LVO symptoms.  Patient with aphasia, headache.  History of migraines.  History of stroke.  Last known normal at 0 800 on Saturday morning.  No numbness or weakness or tingling anywhere.        Home Medications Prior to Admission medications   Medication Sig Start Date End Date Taking? Authorizing Provider  acetaminophen  (TYLENOL ) 325 MG tablet Take 650 mg by mouth 2 (two) times daily as needed for moderate pain.   Yes [provider]  albuterol (VENTOLIN HFA) 108 (90 Base) MCG/ACT inhaler Inhale 2 puffs into the lungs every 4 (four) hours as needed for wheezing or shortness of breath.   Yes [provider]  amphetamine -dextroamphetamine  (ADDERALL XR) 30 MG 24 hr capsule Take 1 capsule (30 mg total) by mouth daily. Patient taking differently: Take 30 mg by mouth in the morning. 06/05/23  Yes Cottle, Kennedy Peabody., MD  amphetamine -dextroamphetamine  (ADDERALL) 15 MG tablet Take 1 tablet (15 mg) by mouth daily in the afternoon. 03/24/23  Yes Cottle, Kennedy Peabody., MD  atropine  1 % ophthalmic solution Place 1 drop under the tongue 3 (three) times daily. 07/27/23  Yes Cottle, Kennedy Peabody., MD  cariprazine (VRAYLAR) 1.5 MG capsule Take 1 capsule by mouth daily.   Yes [provider]  cloNIDine  (CATAPRES ) 0.1 MG tablet Take 2 tablets (0.2 mg total) by mouth 2 (two) times daily. Patient taking differently: Take 0.1 mg by mouth 2 (two) times daily. 07/20/23  Yes Cottle, Kennedy Peabody., MD  cloZAPine  (CLOZARIL ) 100 MG tablet Take 3 tablets (300 mg total) by mouth at bedtime.  07/27/23  Yes Cottle, Kennedy Peabody., MD  divalproex  (DEPAKOTE  ER) 500 MG 24 hr tablet Take 1,000 mg by mouth at bedtime.   Yes [provider]  gabapentin  (NEURONTIN ) 300 MG capsule Take 1 capsule (300 mg total) by mouth 4 (four) times daily. Patient taking differently: Take 300 mg by mouth 3 (three) times daily as needed (neuropathic pain, anxiety). 07/28/23  Yes Cottle, Kennedy Peabody., MD  HYDROcodone -acetaminophen  (NORCO/VICODIN) 5-325 MG tablet Take 1 tablet by mouth every 8 to 12 hours as needed for post op pain. 07/26/23  Yes   ibuprofen (ADVIL,MOTRIN) 200 MG tablet Take 400 mg by mouth daily as needed for moderate pain. Patient taking differently: Take 200-600 mg by mouth daily as needed for moderate pain (pain score 4-6) or mild pain (pain score 1-3).   Yes [provider]  lidocaine  (LIDODERM ) 5 % Apply 1 patch topically daily as needed (BACK PAIN). 04/06/22  Yes [provider]  lithium  carbonate (ESKALITH ) 450 MG ER tablet Take 1 tablet (450 mg total) by mouth at bedtime. 06/19/23  Yes Cottle, Kennedy Peabody., MD  LORazepam  (ATIVAN ) 1 MG tablet Take 1 tablet (1 mg total) by mouth 2 (two) times daily as needed for anxiety AND 2 tablets (2 mg total) every evening. Patient taking differently: Take 1 tablet (1 mg total) by mouth 3 times daily for anxiety  07/19/23  Yes Cottle, Kennedy Peabody., MD  Multiple Vitamin (MULTI VITAMIN)  TABS Take 0.5 tablets by mouth 2 (two) times daily.   Yes [provider]  Solriamfetol  HCl (SUNOSI ) 150 MG TABS Take 1 tablet by mouth in the morning.   Yes [provider]  testosterone  cypionate (DEPOTESTOSTERONE CYPIONATE) 200 MG/ML injection Inject 0.5 mLs (100 mg total) into the muscle once a week. 11/17/22  Yes   Vitamin D , Ergocalciferol , (DRISDOL ) 1.25 MG (50000 UNIT) CAPS capsule Take 1 capsule (50,000 Units total) by mouth every 7 (seven) days. Patient taking differently: Take 50,000 Units by mouth once a week. Take 50,000 units by  mouth every week on Tuesday 02/17/23  Yes Cottle, Kennedy Peabody., MD      Allergies    Trileptal [oxcarbazepine]    Review of Systems   Review of Systems  Physical Exam Updated Vital Signs BP (!) 144/119   Pulse 86   Temp 98.1 F (36.7 C) (Oral)   Resp 19   SpO2 95%  Physical Exam Vitals and nursing note reviewed.  Constitutional:      Appearance: He is well-developed.  HENT:     Head: Normocephalic and atraumatic.     Mouth/Throat:     Mouth: Mucous membranes are moist.  Eyes:     Pupils: Pupils are equal, round, and reactive to light.  Cardiovascular:     Rate and Rhythm: Normal rate.  Pulmonary:     Effort: Pulmonary effort is normal. No respiratory distress.  Abdominal:     General: There is no distension.  Musculoskeletal:        General: Normal range of motion.     Cervical back: Normal range of motion.  Skin:    General: Skin is warm and dry.  Neurological:     Mental Status: He is alert.     Comments: Mild aphasia not able to complete full sentences in some sentences have garbled words although speech itself is relatively clear. No obvious facial asymmetry. Able to hold arms up for 10 seconds against gravity without drift.  Sensation to light touch in distal extremities.     ED Results / Procedures / Treatments   Labs (all labs ordered are listed, but only abnormal results are displayed) Labs Reviewed  COMPREHENSIVE METABOLIC PANEL WITH GFR - Abnormal; Notable for the following components:      Result Value   CO2 21 (*)    Glucose, Bld 124 (*)    All other components within normal limits  I-STAT CHEM 8, ED - Abnormal; Notable for the following components:   Glucose, Bld 121 (*)    All other components within normal limits  ETHANOL  PROTIME-INR  APTT  CBC  DIFFERENTIAL  RAPID URINE DRUG SCREEN, HOSP PERFORMED    EKG None  Radiology CT ANGIO HEAD NECK W WO CM W PERF (CODE STROKE) Result Date: 07/30/2023 CLINICAL DATA:  Neuro deficit, acute,  stroke suspected. EXAM: CT ANGIOGRAPHY HEAD AND NECK CT PERFUSION BRAIN TECHNIQUE: Multidetector CT imaging of the head and neck was performed using the standard protocol during bolus administration of intravenous contrast. Multiplanar CT image reconstructions and MIPs were obtained to evaluate the vascular anatomy. Carotid stenosis measurements (when applicable) are obtained utilizing NASCET criteria, using the distal internal carotid diameter as the denominator. Multiphase CT imaging of the brain was performed following IV bolus contrast injection. Subsequent parametric perfusion maps were calculated using RAPID software. RADIATION DOSE REDUCTION: This exam was performed according to the departmental dose-optimization program which includes automated exposure control, adjustment of the mA  and/or kV according to patient size and/or use of iterative reconstruction technique. CONTRAST:  OMNIPAQUE  IOHEXOL  350 MG/ML SOLN COMPARISON:  None Available. FINDINGS: CTA NECK FINDINGS Aortic arch: Great vessel origins are patent without significant stenosis. Right carotid system: No evidence of dissection, stenosis (50% or greater) or occlusion. Left carotid system: No evidence of dissection, stenosis (50% or greater) or occlusion. Vertebral arteries: Left dominant. No evidence of dissection, stenosis (50% or greater) or occlusion. Skeleton: No acute abnormality on limited assessment. Other neck: No acute abnormality on limited assessment. Upper chest: Visualized lung apices are clear. Review of the MIP images confirms the above findings CTA HEAD FINDINGS Anterior circulation: Bilateral intracranial ICAs, MCAs, and ACAs are patent without proximal hemodynamically significant stenosis. No aneurysm identified. Posterior circulation: Bilateral intradural vertebral arteries, basilar artery and bilateral posterior cerebral arteries are patent without proximal hemodynamically significant stenosis. No aneurysm identified.  Venous sinuses: As permitted by contrast timing, patent. Review of the MIP images confirms the above findings CT Brain Perfusion Findings: ASPECTS: 10 CBF (<30%) Volume: 0mL Perfusion (Tmax>6.0s) volume: 0mL Mismatch Volume: 0mL Infarction Location:None identified. IMPRESSION: 1. No emergent large vessel occlusion or proximal hemodynamically significant stenosis. 2. No evidence of core infarct or penumbra on CT perfusion. Electronically Signed   By: Stevenson Elbe M.D.   On: 07/30/2023 02:07   CT HEAD CODE STROKE WO CONTRAST Result Date: 07/30/2023 CLINICAL DATA:  Code stroke.  Neuro deficit, acute, stroke suspected EXAM: CT HEAD WITHOUT CONTRAST TECHNIQUE: Contiguous axial images were obtained from the base of the skull through the vertex without intravenous contrast. RADIATION DOSE REDUCTION: This exam was performed according to the departmental dose-optimization program which includes automated exposure control, adjustment of the mA and/or kV according to patient size and/or use of iterative reconstruction technique. COMPARISON:  CT head April 19, 2019. FINDINGS: Brain: No evidence of acute infarction, hemorrhage, hydrocephalus, extra-axial collection or mass lesion/mass effect. Vascular: No hyperdense vessel. Skull: No acute fracture. Sinuses/Orbits: Prior endoscopic sinus surgery. Moderate paranasal sinus mucosal thickening. Other: No mastoid effusions. ASPECTS [facility] Stroke Program Early CT Score) Total score (0-10 with 10 being normal): 10. IMPRESSION: No evidence of acute intracranial abnormality. ASPECTS is 10. Code stroke imaging results were communicated on 07/30/2023 at 2:02 am to provider Dr. Alecia Ames via secure text paging. Electronically Signed   By: Stevenson Elbe M.D.   On: 07/30/2023 02:02    Procedures .Critical Care  Performed by: Eve Hinders, MD Authorized by: Eve Hinders, MD   Critical care provider statement:    Critical care time (minutes):  30   Critical care was  time spent personally by me on the following activities:  Development of treatment plan with patient or surrogate, discussions with consultants, evaluation of patient's response to treatment, examination of patient, ordering and review of laboratory studies, ordering and review of radiographic studies, ordering and performing treatments and interventions, pulse oximetry, re-evaluation of patient's condition and review of old charts     Medications Ordered in ED Medications  iohexol  (OMNIPAQUE ) 350 MG/ML injection 100 mL (100 mLs Intravenous Contrast Given 07/30/23 0157)  valproate (DEPACON ) 1,000 mg in dextrose  5 % 50 mL IVPB (0 mg Intravenous Stopped 07/30/23 0524)  ketorolac  (TORADOL ) 30 MG/ML injection 15 mg (15 mg Intravenous Given 07/30/23 0222)    ED Course/ Medical Decision Making/ A&P  Medical Decision Making Amount and/or Complexity of Data Reviewed Labs: ordered. Radiology: ordered.  Seen on arrival. Normal vitals. H/o bipolar and migraines. This could be complex migraines but in window if LVO so will go to ct.   CT ok. Neurology recommending MRI and migraine cocktail. Has a spinal stimulator and needs to be charged and then put in MRI mode - being done now. If symptoms go away totally with headache cocktail then doesn't need mri. Has prolonged QT so holding on reglan/compazine/droperidol and instead going with depakene /toradol . Still symptomatic.  Care transferred pending reevaluation +/- MRI.   Final Clinical Impression(s) / ED Diagnoses Final diagnoses:  None    Rx / DC Orders ED Discharge Orders     None         Rekita Miotke, Reymundo Caulk, MD 07/31/23 0010

## 2023-07-30 NOTE — Discharge Instructions (Signed)
 Labs look good today.  The CAT scan was normal without signs of blockages.  If your symptoms do not resolve you will need an MRI.  If symptoms get worse return to the ER for MRI.

## 2023-07-30 NOTE — ED Provider Notes (Signed)
 Pt with a spinal simulator with HA and aphasia who came in with code stroke.  Concern for possible complex migraine vs stroke.  Currently charging stimulator.  Pt starting to improve after HA cocktail.  Reassessing for improvement.  7:44 AM Pt is currently about80% better and feels now some sx are related to being tired.  He was able to stand and walk to the bathroom without sx when before he was dizzy and unstable.  He does not want to stay for MRI and discussed with DR. Arora who felt if sx are improving this is reasonable.  Discussed with pt if the symptoms do not completely clear then he would need MRI in the future and was given referral to neurology which he and his wife are comfortable with.   Almond Army, MD 07/30/23 [id number]

## 2023-07-30 NOTE — Code Documentation (Signed)
 Responded to Code Stroke called at 0133 for aphasia, [id number] yesterday. Pt arrived at 0138, CBG-121, NIH-2 for aphasia and R arm drift(pt has had recent R shoulder surgery). CT head negative for acute changes, CTA/CTP-no LVO.  TNK not given-pt outside window. Plan:  Migraine cocktail, MRI. Please complete VS/neuro checks(including NIH) q2h x 12h, then q4h.

## 2023-07-31 ENCOUNTER — Other Ambulatory Visit (HOSPITAL_COMMUNITY): Payer: Self-pay

## 2023-07-31 ENCOUNTER — Encounter (HOSPITAL_COMMUNITY): Payer: Self-pay

## 2023-07-31 MED ORDER — TESTOSTERONE CYPIONATE 200 MG/ML IM SOLN
100.0000 mg | INTRAMUSCULAR | 5 refills | Status: AC
  Filled 2023-07-31: qty 10, 70d supply, fill #0
  Filled 2023-08-30 – 2023-10-14 (×2): qty 10, 70d supply, fill #1
  Filled 2023-11-01: qty 4, 28d supply, fill #1
  Filled 2023-11-01: qty 6, 42d supply, fill #1
  Filled 2023-11-01: qty 10, 70d supply, fill #1
  Filled 2024-01-23 – 2024-01-24 (×2): qty 10, 70d supply, fill #2

## 2023-08-02 ENCOUNTER — Other Ambulatory Visit (HOSPITAL_COMMUNITY): Payer: Self-pay

## 2023-08-02 DIAGNOSIS — E291 Testicular hypofunction: Secondary | ICD-10-CM | POA: Diagnosis not present

## 2023-08-09 ENCOUNTER — Other Ambulatory Visit (HOSPITAL_COMMUNITY): Payer: Self-pay

## 2023-08-10 ENCOUNTER — Other Ambulatory Visit (HOSPITAL_COMMUNITY): Payer: Self-pay

## 2023-08-10 ENCOUNTER — Other Ambulatory Visit: Payer: Self-pay | Admitting: Psychiatry

## 2023-08-10 DIAGNOSIS — F900 Attention-deficit hyperactivity disorder, predominantly inattentive type: Secondary | ICD-10-CM

## 2023-08-10 MED ORDER — AMPHETAMINE-DEXTROAMPHET ER 30 MG PO CP24
30.0000 mg | ORAL_CAPSULE | Freq: Every day | ORAL | 0 refills | Status: DC
  Filled 2023-08-10: qty 30, 30d supply, fill #0

## 2023-08-10 MED ORDER — AMPHETAMINE-DEXTROAMPHETAMINE 15 MG PO TABS
15.0000 mg | ORAL_TABLET | Freq: Every day | ORAL | 0 refills | Status: DC
  Filled 2023-08-10: qty 30, 30d supply, fill #0

## 2023-08-11 ENCOUNTER — Other Ambulatory Visit (HOSPITAL_COMMUNITY): Payer: Self-pay

## 2023-08-11 ENCOUNTER — Other Ambulatory Visit: Payer: Self-pay

## 2023-08-18 ENCOUNTER — Encounter: Payer: Self-pay | Admitting: Psychiatry

## 2023-08-18 ENCOUNTER — Ambulatory Visit: Admitting: Psychiatry

## 2023-08-18 ENCOUNTER — Other Ambulatory Visit (HOSPITAL_COMMUNITY): Payer: Self-pay

## 2023-08-18 DIAGNOSIS — G4733 Obstructive sleep apnea (adult) (pediatric): Secondary | ICD-10-CM | POA: Diagnosis not present

## 2023-08-18 DIAGNOSIS — F411 Generalized anxiety disorder: Secondary | ICD-10-CM

## 2023-08-18 DIAGNOSIS — F314 Bipolar disorder, current episode depressed, severe, without psychotic features: Secondary | ICD-10-CM | POA: Diagnosis not present

## 2023-08-18 DIAGNOSIS — F5105 Insomnia due to other mental disorder: Secondary | ICD-10-CM

## 2023-08-18 DIAGNOSIS — F900 Attention-deficit hyperactivity disorder, predominantly inattentive type: Secondary | ICD-10-CM | POA: Diagnosis not present

## 2023-08-18 MED ORDER — LISDEXAMFETAMINE DIMESYLATE 50 MG PO CAPS
50.0000 mg | ORAL_CAPSULE | Freq: Every day | ORAL | 0 refills | Status: DC
  Filled 2023-08-18: qty 30, 30d supply, fill #0

## 2023-08-18 MED ORDER — GABAPENTIN 300 MG PO CAPS
ORAL_CAPSULE | ORAL | 1 refills | Status: AC
  Filled 2023-08-18 – 2023-09-06 (×4): qty 300, 30d supply, fill #0
  Filled 2023-11-28: qty 300, 30d supply, fill #1

## 2023-08-18 MED ORDER — DIVALPROEX SODIUM ER 500 MG PO TB24
1500.0000 mg | ORAL_TABLET | Freq: Every day | ORAL | 0 refills | Status: AC
  Filled 2023-08-18: qty 270, 90d supply, fill #0

## 2023-08-18 NOTE — Progress Notes (Addendum)
 Shawn Cervantes [id number] 1970-04-10 53 y.o.   Subjective:   [name] is a 53 y.o. (DOB Sep 24, 1970) male.  Chief Complaint:  Chief Complaint  Patient presents with   Follow-up   Depression   Anxiety   Fatigue   Sleeping Problem   ADD   Medication Reaction    HPI Shawn Cervantes presents to the office today for follow-up of bipolar depression and panic disorder and ADD.  On 12/24/21 started Trintellix  and up to 10 mg daily.  And changed Adderall to concerta  54 mg Am No sig effect from Concerta  54. Maybe a little loss of energy with switch. NoSE with it. NO SE with trintellix  change. No change in energy.   F in law died.   Maybe more depressed than he's gotten used to being.  Hard to make himself go out.  Can't fake his way through it right now. Anxiety settled after adjusted to Trintellix  but still more.  Anxiety about the same as when on Paxil.  Sleep pattern is same but if anything more sleep than normal.  But some days EFA.  Harder to wake in the morning.   Mirtazapine  plus Quiviq helps initial insomnia.  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 Increase Trintellix  to 20 mg daily for treatment resistant bipolar depression  03/18/22 appt noted: Sleep better off Xtampza  and on morphine  ER 15 BID. No benefit or SE with Trintellix  20 mg daily. Anxiety is ok but not great.  Not the thing that's keeping me from moving DT depression and fatigue. Chronic apathy so why bother doing anything.  Will get anxious thinking about the downward spiral. Concerta  72 mg no better than Adderall.  Doesn't take it daily bc doesn't seem to help.  Stopped it for a couple of weeks.  Did take it consistently at 54 mg .  Didn't notice much from the 72 mg being different. Thinks he originally got benefit from Adderall. Has decided the pain meds make him tired and apathetic and wants  to try to get off pain meds.   Sleep with Ambien  occ. Plan: Concerta  without help so return to Adderall.   Increase Trintellix  to 30 mg daily for treatment resistant bipolar depression for 2 weeks.  If NR stop it.  This is off label. Continue mirtazapine  30 mg HS.   OK prn zolpidem   04/08/22 appt noted: Overused the Ambien  and out.     Took 30 mg Trintellix  for a week or so and stopped it DT NR. Stopped pain pills for a couple of week and used Kratom to help with withdrawal.  Went to doctor yesterday and he got immediate release morphine  15 mg and got #120 pills. Withdrawal was better within a week or so.  Current meds:  Adderall XR 20 daily, flexeril  10 mg HS, Quiviviq, Depakote  ER 1000 mg HS, lamotrigine  100 mg daily, lithium  Cr 450 HS, mirtazapine  30 mg HS, stopped Trintellix , out of Ambien . Better overall mood since off the opiate.  Adderall seems more effective off the opiate.  Would like to go back up on the amount to 30 XR and 15 IR daily. Beth says he's been up out of bed more than he was and applied for some jobs  so she notices some benefit. Last couple of weeks anxiety is pretty good.   Plan: Concerta  without help so return  to Adderall.   DC Trintellix  Agrees to retry Latuda  but go really low to start bc got agitated on it before. Continue mirtazapine  30 mg HS.   DC Ambien  and no further RX for it.  05/18/2022 appointment noted: About 2 to 3 weeks ago stopped Latuda  20 mg because he felt agitated. Wife reports she is not doing well with regard to depression. 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  W doesn't think his thinking realistic.  He's been in bed for a week off Latuda . Got scammed in job situation.  Haven't felt as bad as this in a long time.  Feels like a disappointment.  Plan Amantadine  100 mg HS for a week then 100 mg BID Concerta  without help so return to Adderall.   DC  mirtazapine  30 mg HS Seroquel  25-50 mg HS   DC Ambien  and no further RX for it.  06/17/22 appt  noted: Taking amantadine  twice daily.  Didn't really notice anything as far as benefit. Anxiety has been up more "on fire" inside and feels afraid like something bad is about to happen.  Worse 3 days last week and 3 days this week crippling at times and usually mid afternoon.   Still too sleepy and tired to do anything and this causes anxiety.  Energy better briefly but down again.   Sleep better with Seroquel  at nnight and more benefit.  But trouble waking up More desire to be engaged with people off all opiates.   Plan: DC Amantadine  100 Start Vraylar 1.5 mg QOD  Adderall.   Seroquel  25-50 mg HS   DC Ambien  and no further RX for it. Increase Depakote  ER 1500 mg HS  07/22/22 appt noted: No opiate for almost 2 mos.  Energy is better and once awake can stay awake. Pain is roughly the same but more consistent than when on opiates.  And can have spikes. Kratom helps without SE and occ delta 8 on separate occ and it helps pain. No crazy buzz like THC. Meds: Vraylar 1.5 mg daily, Vit D 50 K week, Seroquel  50 mg HS, Depakote  Er 1500 HS, lamotrigine  100 BID , lithium  ER 450 HS, Adderall XR 30 AM and IR 10 mg PM Started testosterone  which might help mood and energy too.   More consistent.   A little less dep and a little better energy.  Some hangover with Seroquel  and hard to get up 7 AM.  Sleep much better with Seroquel .  Sometimes sleeps through night.  No other SE.   No akathisia. Not much anxiety at all lately.   Dep 4/10.  Definitely best in a long time.    10/07/22 appt noted: CPAP causes anxiety dealing with it using gabapentin . Sleep 5-6 hours with occ of more.   Meds as above  gabapentin  as noted. For the most part ok.  A lot of trouble getting up in the AM chronically but worse the last couple of weeks.  Good bit of fatigue gradually better.   Adderall doesn't wake him up.   Managing to do a few things during the day now.  Takes 1 and 1/2 hour to go to sleep chronically.  To sleep about  1130.  Awakens 2-3 times per night and night eat and then gets up late about noon.  In bed 12 hours daily.  Will sleep solid 7-10 AM regularly.   Always hard to wake up but may be worse with Seroquel .   Been stressing some and a little  low side with dep.  Still has some panic.  Situational usually.   SE mild akathisia  12/30/22 appt noted: Not needing Seroquel  usually for sleep.  Gabapentin  now helping that and anxiety at 600-900 mg daily and helps pain some .  Some hangover but less than Seroquel . Most of time more alert with Adderall and Sunosi  together.  However some days still drags.  No clear reason.  Last night slept well but still dragging today and then just wants to close his eyes.  He's having trouble functioning on days like this.   No SE  with it.   Mood up and down but more up than down since here.  When down is more isolated and withdrawn but usually brief.  More normal days with current med regimen.  Fatigue will cause him to get down. Sept is usually down month.  Was better with this this year.  Wife has noted his upswings in mood.  Can be a bit impulsive with upswings but not severe.  Can return things if needed.   10 yr ago back injury and can't do what he did back then.   SE some hangover with gabapentin  seems unrelated to amount he takes.  Positional tremor with VPA.   Started injx testosterone  for a month. Doing as well as the gel.  Libido is better.  Maybe recover better from physical activity.   Plan: DC Vraylar 1.5 mg QD bc ? Benefit and polypharmacy.    02/03/23 appt noted: Psych meds: stopped Vraylar.  Adderall XR 30 & IR 15 mg PM, Sunosi  150, Depakote  ER 1500 HS, gabapentin  300 TID, lamotrigine  100 BID, Eskalith  CR 450 HS.    Not taking Seroquel  for sleep.  SE hangover Stopped Vraylar and more dep and irritable, increased fear feeling on sense of doom.  Mood definitely worse off the Vraylar.   Situational stress.  Still don't have a job.  A lot of trouble carrying  through on things.  Fatigue with dep.  Napping a couple of times per day.  Poor function making marriage worse.      06/16/23 appt noted:  with wife Psych meds: Seroquel  100 BID, clonidine  0.1 BID, Vraylar 1.5, no lithium  until last night. No SE. Anxious worried and down. Clonidine  helped little with anxiety. He has been very agitated since being off his main mood stabilizing medications Depakote  and lamotrigine .  He is having a great deal of anxiety.  He has trouble sitting still.  He has trouble sleeping.  He is motivated to do anything that may be helpful.  He is not currently suicidal. See the attached notes for details around the events since November 2024.  He has had a course of ECT at [facility].  However he had severe agitation and confusion coming out of anesthesia.  It was so severe that he was somewhat violent for brief periods of time while he was confused.  He is not seen to gain significant benefits from the ECT.  [facility] is reconsidering whether to continue it but it appears that will be discontinued. He is having shoulder surgery next Tuesday. Plan: Cont Adderall XR 30 AM and IR 15 mg PM   Continue Seroquel  100 mg twice daily Resume Depakote  ER 1500 mg HS.   Continue lithium  CR 450 Mg HS Vraylar 1.5 mg   07/05/23 Disc case with wife:   Currently Lithium  Depakote  1000 mg HS Clonidine  0.1 mg BID and 0.1 mg twice daily prn Clozapine  50 mg HS  Ativan  2 mg BID.  Plan: increase clozapine  100 mg HS and then to 150 mg HS asap.  It will not help at lower dose than that. Spread out Ativan  1 mg QID. Increase clonidine  to 0.2 mg BID and even 0.3 mg BID if BP and anxiety are still high.  Nori Beat, MD, DFAPA  08/18/23 appt noted:  Psych meds: Adderall XR 30 every morning with Adderall 15 mg in the afternoon, Sunosi  150 am,  Vraylar 1.5 mg daily, no clonidine  0.1 mg daily, Depakote  ER 1500 nightly, gabapentin  300 mg 3 times daily as needed pain, lithium  ER 450 nightly, reduced  Ativan  1 mg 2-3 times daily.   Seroquel  irregularly 100 HS. Stopped clozapine . 2 ER visits within 10 days complaining of SE clozapine  trouble swallowing.  Stopped clonidine .  He thinks it made NM worse; read about SE.  Had NM of how he would die.   Still having nightly NM; tends to be when upset of any kind, dep or angry.  Now over was sure he was going to die: when he was having trouble swallowing.  Was afraid to go to sleep for a couple of weeks after stopping clozapine .    Gabapentin  does calm him down.   Ongoing STM px some are worse since ECT. Ongoing anxiety including daytime anxiety.  Worrying over can't do things bc shoulder surgery. Trouble sleeping for a couple of days then crashes.  Fear is keeping him from sleeping.  Doesn't think it is entirely conscious but fears NM.  Then awakens panicked.  Afraid to sleep gradually.  Afraid to use CPAP bc suffocation fears.  Was using CPAP more consistently in the early days of use.   Wife sees him as scattered.  Not finishing things.   Getting more dep and unstable emotionally, tearful over anything negative.  Irritable.    Past Psychiatric Medication Trials: Depakote  1500 Oxcarbazepine hyponatremia Lamotrigine  100 mg twice daily Lithium  900 tremor  Saphris 20 sed Seroquel  600 sedation Olanzapine Vraylar 3 mg no response and some akathisia Caplyta restlessness Latuda  questionable dose, brief and possibly agitated, 20 mg retry agitated & angry Clozapine  SE at low dose  trouble swallowing  Adderall 45, modafinil 300 NR, Concerta  72 mg AM poor response  Pramipexole no response and restless Amantadine  100 BID NR  Paroxetine 40 sleepy, fluoxetine,  nortriptyline anxiety,  Wellbutrin no response,  Auvelity no response Trintellix  30 for a week, NR  Gabapentin  4000 mg no response for pain.  Benefit anxiety and sleep Lyrica SE Clonazepam  0.5 mg twice daily, alprazolam sedation Clonidine  0.2 NM worse Trazodone no response Ambien  and  Ambien  CR abuse,  hydroxyzine, Lunesta, mirtazapine  30,  Thorazine 25 side effects of night eating, Seroquel  50 for sleep Quiviq Lyrica  ECT 2025 unsuccessful with difficulty from anesthesia  Therapist [facility] EMDR  Psychiatric hospitalization in 1997  Flowsheet Row ED from 06/29/2023 in [facility] at [facility] Admission (Discharged) from 06/20/2023 in [place] LONG PERIOPERATIVE AREA UC from 10/31/2021 in [facility] RISK CATEGORY No Risk No Risk No Risk        Review of Systems:  Review of Systems  Constitutional:  Positive for fatigue.  Musculoskeletal:  Positive for arthralgias and back pain.  Neurological:  Positive for tremors.  Psychiatric/Behavioral:  Positive for agitation, decreased concentration, dysphoric mood and sleep disturbance. The patient is nervous/anxious.     Medications: I have reviewed the patient's current medications.  Current Outpatient  Medications  Medication Sig Dispense Refill   acetaminophen  (TYLENOL ) 325 MG tablet Take 650 mg by mouth 2 (two) times daily as needed for moderate pain.     albuterol (VENTOLIN HFA) 108 (90 Base) MCG/ACT inhaler Inhale 2 puffs into the lungs every 4 (four) hours as needed for wheezing or shortness of breath.     amphetamine -dextroamphetamine  (ADDERALL XR) 30 MG 24 hr capsule Take 1 capsule (30 mg total) by mouth daily. 30 capsule 0   cariprazine (VRAYLAR) 1.5 MG capsule Take 1 capsule by mouth daily.     ibuprofen (ADVIL,MOTRIN) 200 MG tablet Take 400 mg by mouth daily as needed for moderate pain. (Patient taking differently: Take 200-600 mg by mouth daily as needed for moderate pain (pain score 4-6) or mild pain (pain score 1-3).)     lidocaine  (LIDODERM ) 5 % Apply 1 patch topically daily as needed (BACK PAIN).     lisdexamfetamine (VYVANSE) 50 MG capsule Take 1 capsule (50 mg total) by mouth daily. 30 capsule 0   lithium  carbonate (ESKALITH ) 450 MG ER tablet  Take 1 tablet (450 mg total) by mouth at bedtime. 90 tablet 0   LORazepam  (ATIVAN ) 1 MG tablet Take 1 tablet (1 mg total) by mouth 2 (two) times daily as needed for anxiety AND 2 tablets (2 mg total) every evening. (Patient taking differently: Take 1 tablet (1 mg total) by mouth 3 times daily for anxiety ) 50 tablet 0   Multiple Vitamin (MULTI VITAMIN) TABS Take 0.5 tablets by mouth 2 (two) times daily.     testosterone  cypionate (DEPOTESTOSTERONE CYPIONATE) 200 MG/ML injection Inject 0.5 mLs (100 mg total) into the muscle once a week. 10 mL 5   testosterone  cypionate (DEPOTESTOSTERONE CYPIONATE) 200 MG/ML injection Inject 0.5 mLs (100 mg total) into the muscle once a week. 10 mL 5   Vitamin D , Ergocalciferol , (DRISDOL ) 1.25 MG (50000 UNIT) CAPS capsule Take 1 capsule (50,000 Units total) by mouth every 7 (seven) days. (Patient taking differently: Take 50,000 Units by mouth once a week. Take 50,000 units by mouth every week on Tuesday) 15 capsule 1   divalproex  (DEPAKOTE  ER) 500 MG 24 hr tablet Take 3 tablets (1,500 mg total) by mouth at bedtime. 270 tablet 0   gabapentin  (NEURONTIN ) 300 MG capsule 3 capsules by mouth twice daily and 4 capsules in the evening 300 capsule 1   No current facility-administered medications for this visit.    Medication Side Effects: None  Allergies:  Allergies  Allergen Reactions   Trileptal [Oxcarbazepine] Other (See Comments)    Low sodium    Past Medical History:  Diagnosis Date   ADHD (attention deficit hyperactivity disorder)    Anxiety    Arthritis    knee and back   Bipolar 1 disorder (HCC)    Complication of anesthesia    Constipation    Depression    bi polar   GERD (gastroesophageal reflux disease)    Head injury    as a child ;had stitches in head   Headache    migraines   Hypertension    Mental disorder    Bipolar   PONV (postoperative nausea and vomiting)    Sleep apnea    wears Cpap   Testosterone  deficiency    Trigeminal  neuralgia of left side of face 04/01/2019   Left V2    Past Medical History, Surgical history, Social history, and Family history were reviewed and updated as appropriate.   Please see review of systems  for further details on the patient's review from today.   Objective:   Physical Exam:  There were no vitals taken for this visit.  Physical Exam Neurological:     Mental Status: He is alert and oriented to person, place, and time.     Cranial Nerves: No dysarthria.  Psychiatric:        Attention and Perception: Attention and perception normal.        Mood and Affect: Mood is anxious and depressed. Affect is tearful. Affect is not blunt.        Speech: Speech normal.        Behavior: Behavior is agitated. Behavior is not aggressive. Behavior is cooperative.        Thought Content: Thought content normal. Thought content is not paranoid or delusional. Thought content does not include homicidal or suicidal ideation. Thought content does not include suicidal plan.        Cognition and Memory: Cognition and memory normal.        Judgment: Judgment normal.     Comments: Insight intact Ongoing dep and anxious. Reduced eye contact.     Lab Review:     Component Value Date/Time   NA 139 07/30/2023 0145   NA 139 05/25/2021 1501   K 3.5 07/30/2023 0145   CL 102 07/30/2023 0145   CO2 21 (L) 07/30/2023 0141   GLUCOSE 121 (H) 07/30/2023 0145   BUN 17 07/30/2023 0145   BUN 11 05/25/2021 1501   CREATININE 1.10 07/30/2023 0145   CALCIUM 9.2 07/30/2023 0141   PROT 6.9 07/30/2023 0141   PROT 6.9 09/22/2020 0927   ALBUMIN  4.2 07/30/2023 0141   ALBUMIN  4.5 09/22/2020 0927   AST 26 07/30/2023 0141   ALT 22 07/30/2023 0141   ALKPHOS 42 07/30/2023 0141   BILITOT 1.2 07/30/2023 0141   BILITOT <0.2 09/22/2020 0927   GFRNONAA >60 07/30/2023 0141   GFRAA >60 08/02/2016 1416       Component Value Date/Time   WBC 8.3 07/30/2023 0141   RBC 5.08 07/30/2023 0141   HGB 15.3 07/30/2023 0145    HGB 15.2 07/26/2023 1531   HCT 45.0 07/30/2023 0145   HCT 45.0 07/26/2023 1531   PLT 228 07/30/2023 0141   PLT 244 07/26/2023 1531   MCV 87.8 07/30/2023 0141   MCV 90 07/26/2023 1531   MCH 30.9 07/30/2023 0141   MCHC 35.2 07/30/2023 0141   RDW 12.8 07/30/2023 0141   RDW 13.1 07/26/2023 1531   LYMPHSABS 2.7 07/30/2023 0141   LYMPHSABS 2.3 07/26/2023 1531   MONOABS 0.6 07/30/2023 0141   EOSABS 0.4 07/30/2023 0141   EOSABS 0.4 07/26/2023 1531   BASOSABS 0.1 07/30/2023 0141   BASOSABS 0.1 07/26/2023 1531    Lithium  Lvl  Date Value Ref Range Status  05/25/2021 0.5 0.5 - 1.2 mmol/L Final    Comment:    A concentration of 0.5-0.8 mmol/L is advised for long-term use; concentrations of up to 1.2 mmol/L may be necessary during acute treatment.                                  Detection Limit = 0.1                           <0.1 indicates None Detected      Lab Results  Component Value Date   VALPROATE 51 05/30/2019     .  res Assessment: Plan:    Paulanthony was seen today for follow-up, depression, anxiety, fatigue, sleeping problem, add and medication reaction.  Diagnoses and all orders for this visit:  Bipolar disorder with severe depression (HCC) -     divalproex  (DEPAKOTE  ER) 500 MG 24 hr tablet; Take 3 tablets (1,500 mg total) by mouth at bedtime. -     gabapentin  (NEURONTIN ) 300 MG capsule; 3 capsules by mouth twice daily and 4 capsules in the evening  Generalized anxiety disorder -     gabapentin  (NEURONTIN ) 300 MG capsule; 3 capsules by mouth twice daily and 4 capsules in the evening  Attention deficit hyperactivity disorder (ADHD), predominantly inattentive type -     lisdexamfetamine (VYVANSE) 50 MG capsule; Take 1 capsule (50 mg total) by mouth daily.  Obstructive sleep apnea hypopnea, severe  Insomnia due to mental condition     50 min with patient and his wife. 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  Multiple medication failures.  Likely  elements of PTSD complicating treatment.  Rec counseling .  Likely would benefit more with EMDR.  Consider Alvino Joseph.   Counseling 25 min:  disc PTSD sx.  Disc how that can compromise response with psych meds and potential synergism. Disc types of techniques and esp EMDR.  No opiates for several mos.  Better energy.   OSA severe on CPAP as of early June 2024.  Try nasal pillows mask.  option Retry pramipexole (highest was 1.5 mg daily) but hx restlessness with it. Option doxazosin for NM Consider resuming lamotrigine  for depression.  DC Sunosi  and Switch to Vyvanse 50 mg AM for better ADD management Adderall XR 30 AM and IR 15 mg PM    Depakote  ER 1500 mg HS.   Continue lithium  CR 450 Mg HS  Increase gabapentin  for RLS, anxiety, sleep.  1200 mg pm and 600-900 mg BID.  He and his wife agree with this plan.  Stopped Ambien  and no further RX for it.  Seroquel  as need for sleep 50-100 mg HS  Discussed potential benefits, risks, and side effects of stimulants with patient to include increased heart rate, palpitations, insomnia, increased anxiety, increased irritability, or decreased appetite.  Instructed patient to contact office if experiencing any significant tolerability issues.  Discussed potential metabolic side effects associated with atypical antipsychotics, as well as potential risk for movement side effects. Advised pt to contact office if movement side effects occur.   Follow-up   Discussed side effects of each medication.  Nori Beat MD, DFAPA   Please see After Visit Summary for patient specific instructions. Stop Sunosi  Add Vyvanse 1 in the morning with Adderall XR.  Increase gabapentin  for anxiety and sleep to 600-900 mg twice daily and 1200 mg at night.    Stop Vraylar and switch to Rexulti 1 mg in the morning.  Try nasal pillows mask bc claustrophobia.  Nori Beat, MD, DFAPA   Future Appointments  Date Time Provider Department Center  11/07/2023   2:00 PM Dohmeier, Raoul Byes, MD [facility] None       No orders of the defined types were placed in this encounter.   -------------------------------bi

## 2023-08-18 NOTE — Patient Instructions (Addendum)
 Stop Sunosi  Add Vyvanse 1 in the morning with Adderall XR.  Increase gabapentin  for anxiety and sleep to 600-900 mg twice daily and 1200 mg at night.    Stop Vraylar and switch to Rexulti 1 mg in the morning.  Try nasal pillows mask bc claustrophobia.

## 2023-08-21 ENCOUNTER — Other Ambulatory Visit: Payer: Self-pay

## 2023-08-21 ENCOUNTER — Other Ambulatory Visit: Payer: Self-pay | Admitting: Psychiatry

## 2023-08-21 ENCOUNTER — Other Ambulatory Visit (HOSPITAL_COMMUNITY): Payer: Self-pay

## 2023-08-21 DIAGNOSIS — F411 Generalized anxiety disorder: Secondary | ICD-10-CM

## 2023-08-21 MED ORDER — LORAZEPAM 1 MG PO TABS
ORAL_TABLET | ORAL | 0 refills | Status: DC
  Filled 2023-08-21: qty 60, 20d supply, fill #0

## 2023-08-22 ENCOUNTER — Other Ambulatory Visit (HOSPITAL_COMMUNITY): Payer: Self-pay

## 2023-08-30 ENCOUNTER — Encounter (HOSPITAL_COMMUNITY): Payer: Self-pay

## 2023-08-30 ENCOUNTER — Other Ambulatory Visit: Payer: Self-pay

## 2023-08-30 ENCOUNTER — Other Ambulatory Visit (HOSPITAL_COMMUNITY): Payer: Self-pay

## 2023-08-30 ENCOUNTER — Other Ambulatory Visit: Payer: Self-pay | Admitting: Psychiatry

## 2023-08-30 DIAGNOSIS — F411 Generalized anxiety disorder: Secondary | ICD-10-CM

## 2023-08-30 DIAGNOSIS — F900 Attention-deficit hyperactivity disorder, predominantly inattentive type: Secondary | ICD-10-CM

## 2023-08-31 DIAGNOSIS — G4733 Obstructive sleep apnea (adult) (pediatric): Secondary | ICD-10-CM | POA: Diagnosis not present

## 2023-09-01 ENCOUNTER — Telehealth: Admitting: Psychiatry

## 2023-09-06 ENCOUNTER — Other Ambulatory Visit (HOSPITAL_COMMUNITY): Payer: Self-pay

## 2023-09-07 ENCOUNTER — Other Ambulatory Visit (HOSPITAL_COMMUNITY): Payer: Self-pay

## 2023-09-08 ENCOUNTER — Other Ambulatory Visit (HOSPITAL_COMMUNITY): Payer: Self-pay

## 2023-09-08 ENCOUNTER — Encounter: Payer: Self-pay | Admitting: Psychiatry

## 2023-09-08 ENCOUNTER — Telehealth (INDEPENDENT_AMBULATORY_CARE_PROVIDER_SITE_OTHER): Admitting: Psychiatry

## 2023-09-08 DIAGNOSIS — F900 Attention-deficit hyperactivity disorder, predominantly inattentive type: Secondary | ICD-10-CM | POA: Diagnosis not present

## 2023-09-08 DIAGNOSIS — F5105 Insomnia due to other mental disorder: Secondary | ICD-10-CM | POA: Diagnosis not present

## 2023-09-08 DIAGNOSIS — F314 Bipolar disorder, current episode depressed, severe, without psychotic features: Secondary | ICD-10-CM | POA: Diagnosis not present

## 2023-09-08 DIAGNOSIS — G4733 Obstructive sleep apnea (adult) (pediatric): Secondary | ICD-10-CM | POA: Diagnosis not present

## 2023-09-08 DIAGNOSIS — F411 Generalized anxiety disorder: Secondary | ICD-10-CM | POA: Diagnosis not present

## 2023-09-08 MED ORDER — LAMOTRIGINE 100 MG PO TBDP: 100.0000 mg | ORAL_TABLET | Freq: Every day | ORAL | Status: DC

## 2023-09-08 MED ORDER — SERTRALINE HCL 100 MG PO TABS
ORAL_TABLET | ORAL | 1 refills | Status: DC
  Filled 2023-09-08: qty 30, 30d supply, fill #0

## 2023-09-08 MED ORDER — AMPHETAMINE-DEXTROAMPHET ER 30 MG PO CP24
30.0000 mg | ORAL_CAPSULE | Freq: Every day | ORAL | 0 refills | Status: DC
  Filled 2023-09-08: qty 30, 30d supply, fill #0

## 2023-09-08 MED ORDER — LORAZEPAM 1 MG PO TABS
1.0000 mg | ORAL_TABLET | Freq: Four times a day (QID) | ORAL | 0 refills | Status: DC
  Filled 2023-09-08 – 2023-09-11 (×2): qty 120, 30d supply, fill #0

## 2023-09-08 NOTE — Progress Notes (Signed)
 Shawn Cervantes [id number] 24-Jul-1970 53 y.o.  Video Visit via My Chart  I connected with pt by video using My Chart and verified that I am speaking with the correct person using two identifiers.   I discussed the limitations, risks, security and privacy concerns of performing an evaluation and management service by My Chart  and the availability of in person appointments. I also discussed with the patient that there may be a patient responsible charge related to this service. The patient expressed understanding and agreed to proceed.  I discussed the assessment and treatment plan with the patient. The patient was provided an opportunity to ask questions and all were answered. The patient agreed with the plan and demonstrated an understanding of the instructions.   The patient was advised to call back or seek an in-person evaluation if the symptoms worsen or if the condition fails to improve as anticipated.  I provided 30 minutes of video time during this encounter.  The patient was located at home and the provider was located office. Session 330-400  Subjective:   [name] is a 53 y.o. (DOB 07/02/1970) male.  Chief Complaint:  Chief Complaint  Patient presents with   Follow-up   Anxiety   Depression   Fatigue   ADD   Sleeping Problem    HPI Shawn Cervantes presents to the office today for follow-up of bipolar depression and panic disorder and ADD.  On 12/24/21 started Trintellix  and up to 10 mg daily.  And changed Adderall to concerta  54 mg Am No sig effect from Concerta  54. Maybe a little loss of energy with switch. NoSE with it. NO SE with trintellix  change. No change in energy.   F in law died.   Maybe more depressed than he's gotten used to being.  Hard to make himself go out.  Can't fake his way through it right now. Anxiety settled after adjusted to Trintellix  but still more.  Anxiety about the same as when on Paxil.  Sleep pattern is same but if anything  more sleep than normal.  But some days EFA.  Harder to wake in the morning.   Mirtazapine  plus Quiviq helps initial insomnia.  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 Increase Trintellix  to 20 mg daily for treatment resistant bipolar depression  03/18/22 appt noted: Sleep better off Xtampza  and on morphine  ER 15 BID. No benefit or SE with Trintellix  20 mg daily. Anxiety is ok but not great.  Not the thing that's keeping me from moving DT depression and fatigue. Chronic apathy so why bother doing anything.  Will get anxious thinking about the downward spiral. Concerta  72 mg no better than Adderall.  Doesn't take it daily bc doesn't seem to help.  Stopped it for a couple of weeks.  Did take it consistently at 54 mg .  Didn't notice much from the 72 mg being different. Thinks he originally got benefit from Adderall. Has decided the pain meds make him tired and apathetic and wants to try to get off pain meds.   Sleep with Ambien  occ. Plan: Concerta  without help so return to Adderall.   Increase Trintellix  to 30 mg daily for treatment resistant bipolar depression for 2 weeks.  If NR stop it.  This is off label. Continue mirtazapine  30 mg HS.   OK prn zolpidem   04/08/22 appt noted:  Overused the Ambien  and out.     Took 30 mg Trintellix  for a week or so and stopped it DT NR. Stopped pain pills for a couple of week and used Kratom to help with withdrawal.  Went to doctor yesterday and he got immediate release morphine  15 mg and got #120 pills. Withdrawal was better within a week or so.  Current meds:  Adderall XR 20 daily, flexeril  10 mg HS, Quiviviq, Depakote  ER 1000 mg HS, lamotrigine  100 mg daily, lithium  Cr 450 HS, mirtazapine  30 mg HS, stopped Trintellix , out of Ambien . Better overall mood since off the opiate.  Adderall seems more effective off the opiate.  Would like to go back up on the  amount to 30 XR and 15 IR daily. Beth says he's been up out of bed more than he was and applied for some jobs  so she notices some benefit. Last couple of weeks anxiety is pretty good.   Plan: Concerta  without help so return to Adderall.   DC Trintellix  Agrees to retry Latuda  but go really low to start bc got agitated on it before. Continue mirtazapine  30 mg HS.   DC Ambien  and no further RX for it.  05/18/2022 appointment noted: About 2 to 3 weeks ago stopped Latuda  20 mg because he felt agitated. Wife reports she is not doing well with regard to depression. 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  W doesn't think his thinking realistic.  He's been in bed for a week off Latuda . Got scammed in job situation.  Haven't felt as bad as this in a long time.  Feels like a disappointment.  Plan Amantadine  100 mg HS for a week then 100 mg BID Concerta  without help so return to Adderall.   DC  mirtazapine  30 mg HS Seroquel  25-50 mg HS   DC Ambien  and no further RX for it.  06/17/22 appt noted: Taking amantadine  twice daily.  Didn't really notice anything as far as benefit. Anxiety has been up more on fire inside and feels afraid like something bad is about to happen.  Worse 3 days last week and 3 days this week crippling at times and usually mid afternoon.   Still too sleepy and tired to do anything and this causes anxiety.  Energy better briefly but down again.   Sleep better with Seroquel  at nnight and more benefit.  But trouble waking up More desire to be engaged with people off all opiates.   Plan: DC Amantadine  100 Start Vraylar 1.5 mg QOD  Adderall.   Seroquel  25-50 mg HS   DC Ambien  and no further RX for it. Increase Depakote  ER 1500 mg HS  07/22/22 appt noted: No opiate for almost 2 mos.  Energy is better and once awake can stay awake. Pain is roughly the same but more consistent than when on opiates.  And can have spikes. Kratom helps without SE and occ delta 8 on separate occ and  it helps pain. No crazy buzz like THC. Meds: Vraylar 1.5 mg daily, Vit D 50 K week, Seroquel  50 mg HS, Depakote  Er 1500 HS, lamotrigine  100 BID , lithium  ER 450 HS, Adderall XR 30 AM and IR 10 mg PM Started testosterone  which might help mood and energy too.   More consistent.   A little less dep and a little better energy.  Some hangover with Seroquel  and hard to get up 7 AM.  Sleep much better with Seroquel .  Sometimes sleeps through night.  No other SE.   No akathisia. Not much anxiety at all lately.   Dep 4/10.  Definitely best in a long time.    10/07/22 appt noted: CPAP causes anxiety dealing with it using gabapentin . Sleep 5-6 hours with occ of more.   Meds as above  gabapentin  as noted. For the most part ok.  A lot of trouble getting up in the AM chronically but worse the last couple of weeks.  Good bit of fatigue gradually better.   Adderall doesn't wake him up.   Managing to do a few things during the day now.  Takes 1 and 1/2 hour to go to sleep chronically.  To sleep about 1130.  Awakens 2-3 times per night and night eat and then gets up late about noon.  In bed 12 hours daily.  Will sleep solid 7-10 AM regularly.   Always hard to wake up but may be worse with Seroquel .   Been stressing some and a little low side with dep.  Still has some panic.  Situational usually.   SE mild akathisia  12/30/22 appt noted: Not needing Seroquel  usually for sleep.  Gabapentin  now helping that and anxiety at 600-900 mg daily and helps pain some .  Some hangover but less than Seroquel . Most of time more alert with Adderall and Sunosi  together.  However some days still drags.  No clear reason.  Last night slept well but still dragging today and then just wants to close his eyes.  He's having trouble functioning on days like this.   No SE  with it.   Mood up and down but more up than down since here.  When down is more isolated and withdrawn but usually brief.  More normal days with current med regimen.   Fatigue will cause him to get down. Sept is usually down month.  Was better with this this year.  Wife has noted his upswings in mood.  Can be a bit impulsive with upswings but not severe.  Can return things if needed.   10 yr ago back injury and can't do what he did back then.   SE some hangover with gabapentin  seems unrelated to amount he takes.  Positional tremor with VPA.   Started injx testosterone  for a month. Doing as well as the gel.  Libido is better.  Maybe recover better from physical activity.   Plan: DC Vraylar 1.5 mg QD bc ? Benefit and polypharmacy.    02/03/23 appt noted: Psych meds: stopped Vraylar.  Adderall XR 30 & IR 15 mg PM, Sunosi  150, Depakote  ER 1500 HS, gabapentin  300 TID, lamotrigine  100 BID, Eskalith  CR 450 HS.    Not taking Seroquel  for sleep.  SE hangover Stopped Vraylar and more dep and irritable, increased fear feeling on sense of doom.  Mood definitely worse off the Vraylar.   Situational stress.  Still don't have a job.  A lot of trouble carrying through on things.  Fatigue with dep.  Napping a couple of times per day.  Poor function making marriage worse.      06/16/23 appt noted:  with wife Psych meds: Seroquel  100 BID, clonidine  0.1 BID, Vraylar 1.5, no lithium  until last night. No SE. Anxious worried and down. Clonidine  helped little with anxiety. He has been very agitated since being off his main mood stabilizing medications Depakote  and lamotrigine .  He is having a great deal of anxiety.  He has trouble sitting still.  He has trouble sleeping.  He  is motivated to do anything that may be helpful.  He is not currently suicidal. See the attached notes for details around the events since November 2024.  He has had a course of ECT at [facility].  However he had severe agitation and confusion coming out of anesthesia.  It was so severe that he was somewhat violent for brief periods of time while he was confused.  He is not seen to gain significant benefits from  the ECT.  [facility] is reconsidering whether to continue it but it appears that will be discontinued. He is having shoulder surgery next Tuesday. Plan: Cont Adderall XR 30 AM and IR 15 mg PM   Continue Seroquel  100 mg twice daily Resume Depakote  ER 1500 mg HS.   Continue lithium  CR 450 Mg HS Vraylar 1.5 mg   07/05/23 Disc case with wife:   Currently Lithium  Depakote  1000 mg HS Clonidine  0.1 mg BID and 0.1 mg twice daily prn Clozapine  50 mg HS Ativan  2 mg BID.  Plan: increase clozapine  100 mg HS and then to 150 mg HS asap.  It will not help at lower dose than that. Spread out Ativan  1 mg QID. Increase clonidine  to 0.2 mg BID and even 0.3 mg BID if BP and anxiety are still high.  Lorene Macintosh, MD, DFAPA  08/18/23 appt noted:  Psych meds: Adderall XR 30 every morning with Adderall 15 mg in the afternoon, Sunosi  150 am,  Vraylar 1.5 mg daily, no clonidine  0.1 mg daily, Depakote  ER 1500 nightly, gabapentin  300 mg 3 times daily as needed pain, lithium  ER 450 nightly, reduced Ativan  1 mg 2-3 times daily.   Seroquel  irregularly 100 HS. Stopped clozapine . 2 ER visits within 10 days complaining of SE clozapine  trouble swallowing.  Stopped clonidine .  He thinks it made NM worse; read about SE.  Had NM of how he would die.   Still having nightly NM; tends to be when upset of any kind, dep or angry.  Now over was sure he was going to die: when he was having trouble swallowing.  Was afraid to go to sleep for a couple of weeks after stopping clozapine .    Gabapentin  does calm him down.   Ongoing STM px some are worse since ECT. Ongoing anxiety including daytime anxiety.  Worrying over can't do things bc shoulder surgery. Trouble sleeping for a couple of days then crashes.  Fear is keeping him from sleeping.  Doesn't think it is entirely conscious but fears NM.  Then awakens panicked.  Afraid to sleep gradually.  Afraid to use CPAP bc suffocation fears.  Was using CPAP more consistently in the early days  of use.   Wife sees him as scattered.  Not finishing things.   Getting more dep and unstable emotionally, tearful over anything negative.  Irritable.  Plan: Stop Sunosi  Add Vyvanse  50 in the morning with Adderall XR. Increase gabapentin  for anxiety and sleep to 600-900 mg twice daily and 1200 mg at night.   Stop Vraylar and switch to Rexulti 1 mg in the morning. Try nasal pillows mask bc claustrophobia.  09/08/23 appt noted:  Psych meds: Vyvanse  50 AM with Adderall XR mg AM, stopped Sunosi  150 am,  continued Vraylar 1.5, no Rexulti,   Depakote  ER 1500 nightly, gabapentin  300 mg 1200 mg PM and 600 mg 2 times daily as needed pain, lithium  ER 450 nightly, reduced Ativan  1 mg 2-3 times daily.   Seroquel  irregularly 100 HS. No effect noticed with  med changes.  Still exhausted and sleepy and hard to wake up.  No benefit Vyvanse .  Spending most of the day in bed. He No sig akathisia.   CC Anxiety is probably worse than last time.  Couple occ unable to leave house bc so anxious.  Anxiety worse makes him more dep.  Sleep better with Ativan  and Seroquel .  Hangover with it.  Wife says sleeping a lot. Feels frightened about sleep and fear choking but not really having the px he had before on clozapine .   SE hangover ? NM Reviewed prior meds.   Normal BP in June. ? Was elevated on Sunosi  and Adderall.    Past Psychiatric Medication Trials: Depakote  1500 Oxcarbazepine hyponatremia Lamotrigine  100 mg twice daily Lithium  900 tremor  Saphris 20 sed Seroquel  600 sedation Olanzapine Vraylar 3 mg no response and some akathisia Rexulti ? effect Caplyta restlessness Latuda  questionable dose, brief and possibly agitated, 20 mg retry agitated & angry Clozapine  SE at low dose  trouble swallowing  Adderall 45, modafinil 300 NR, Concerta  72 mg AM poor response, Vyvanse  50  Pramipexole no response and restless Amantadine  100 BID NR  Paroxetine 40 sleepy, fluoxetine, remote Zoloft nortriptyline anxiety,   Wellbutrin no response,  Auvelity no response Trintellix  30 for a week, NR  Gabapentin  4000 mg no response for pain.  Benefit anxiety and sleep Lyrica SE Clonazepam  0.5 mg twice daily, alprazolam sedation Clonidine  0.2 NM worse Trazodone no response Ambien  and Ambien  CR abuse,  hydroxyzine, Lunesta, mirtazapine  30,  Thorazine 25 side effects of night eating, Seroquel  50 for sleep Quiviq Lyrica  ECT 2025 unsuccessful with difficulty from anesthesia  Therapist [facility] EMDR  Psychiatric hospitalization in 1997  Flowsheet Row ED from 06/29/2023 in [facility] Emergency Department at [facility] Admission (Discharged) from 06/20/2023 in [facility] from 10/31/2021 in [facility] RISK CATEGORY No Risk No Risk No Risk     Review of Systems:  Review of Systems  Constitutional:  Positive for fatigue.  Musculoskeletal:  Positive for arthralgias and back pain.  Neurological:  Positive for tremors.  Psychiatric/Behavioral:  Positive for agitation, decreased concentration, dysphoric mood and sleep disturbance. The patient is nervous/anxious.     Medications: I have reviewed the patient's current medications.  Current Outpatient Medications  Medication Sig Dispense Refill   acetaminophen  (TYLENOL ) 325 MG tablet Take 650 mg by mouth 2 (two) times daily as needed for moderate pain.     albuterol (VENTOLIN HFA) 108 (90 Base) MCG/ACT inhaler Inhale 2 puffs into the lungs every 4 (four) hours as needed for wheezing or shortness of breath.     amphetamine -dextroamphetamine  (ADDERALL XR) 30 MG 24 hr capsule Take 1 capsule (30 mg total) by mouth daily. 30 capsule 0   cariprazine (VRAYLAR) 1.5 MG capsule Take 1 capsule by mouth daily.     divalproex  (DEPAKOTE  ER) 500 MG 24 hr tablet Take 3 tablets (1,500 mg total) by mouth at bedtime. 270 tablet 0   gabapentin  (NEURONTIN ) 300 MG capsule Take 3 capsules (900 mg total) by mouth 2 (two)  times daily AND 4 capsules (1,200 mg total) every evening. 300 capsule 1   ibuprofen (ADVIL,MOTRIN) 200 MG tablet Take 400 mg by mouth daily as needed for moderate pain. (Patient taking differently: Take 200-600 mg by mouth daily as needed for moderate pain (pain score 4-6) or mild pain (pain score 1-3).)     lidocaine  (LIDODERM ) 5 % Apply  1 patch topically daily as needed (BACK PAIN).     lisdexamfetamine (VYVANSE ) 50 MG capsule Take 1 capsule (50 mg total) by mouth daily. 30 capsule 0   lithium  carbonate (ESKALITH ) 450 MG ER tablet Take 1 tablet (450 mg total) by mouth at bedtime. 90 tablet 0   LORazepam  (ATIVAN ) 1 MG tablet Take 1 tablet (1 mg total) by mouth 3 times daily for anxiety 60 tablet 0   Multiple Vitamin (MULTI VITAMIN) TABS Take 0.5 tablets by mouth 2 (two) times daily.     testosterone  cypionate (DEPOTESTOSTERONE CYPIONATE) 200 MG/ML injection Inject 0.5 mLs (100 mg total) into the muscle once a week. 10 mL 5   testosterone  cypionate (DEPOTESTOSTERONE CYPIONATE) 200 MG/ML injection Inject 0.5 mLs (100 mg total) into the muscle once a week. 10 mL 5   Vitamin D , Ergocalciferol , (DRISDOL ) 1.25 MG (50000 UNIT) CAPS capsule Take 1 capsule (50,000 Units total) by mouth every 7 (seven) days. (Patient taking differently: Take 50,000 Units by mouth once a week. Take 50,000 units by mouth every week on Tuesday) 15 capsule 1   brexpiprazole (REXULTI) 1 MG TABS tablet Take 1 mg by mouth daily. (Patient not taking: Reported on 09/08/2023)     No current facility-administered medications for this visit.    Medication Side Effects: None  Allergies:  Allergies  Allergen Reactions   Trileptal [Oxcarbazepine] Other (See Comments)    Low sodium    Past Medical History:  Diagnosis Date   ADHD (attention deficit hyperactivity disorder)    Anxiety    Arthritis    knee and back   Bipolar 1 disorder (HCC)    Complication of anesthesia    Constipation    Depression    bi polar   GERD  (gastroesophageal reflux disease)    Head injury    as a child ;had stitches in head   Headache    migraines   Hypertension    Mental disorder    Bipolar   PONV (postoperative nausea and vomiting)    Sleep apnea    wears Cpap   Testosterone  deficiency    Trigeminal neuralgia of left side of face 04/01/2019   Left V2    Past Medical History, Surgical history, Social history, and Family history were reviewed and updated as appropriate.   Please see review of systems for further details on the patient's review from today.   Objective:   Physical Exam:  There were no vitals taken for this visit.  Physical Exam  Neurological:     Mental Status: He is alert and oriented to person, place, and time.     Cranial Nerves: No dysarthria.   Psychiatric:        Attention and Perception: Attention and perception normal.        Mood and Affect: Mood is anxious and depressed. Affect is tearful. Affect is not blunt.        Speech: Speech normal.        Behavior: Behavior is agitated. Behavior is not aggressive. Behavior is cooperative.        Thought Content: Thought content normal. Thought content is not paranoid or delusional. Thought content does not include homicidal or suicidal ideation. Thought content does not include suicidal plan.        Cognition and Memory: Cognition and memory normal.        Judgment: Judgment normal.     Comments: Insight intact Ongoing dep and anxious. Reduced eye contact.  Lab Review:     Component Value Date/Time   NA 139 07/30/2023 0145   NA 139 05/25/2021 1501   K 3.5 07/30/2023 0145   CL 102 07/30/2023 0145   CO2 21 (L) 07/30/2023 0141   GLUCOSE 121 (H) 07/30/2023 0145   BUN 17 07/30/2023 0145   BUN 11 05/25/2021 1501   CREATININE 1.10 07/30/2023 0145   CALCIUM 9.2 07/30/2023 0141   PROT 6.9 07/30/2023 0141   PROT 6.9 09/22/2020 0927   ALBUMIN  4.2 07/30/2023 0141   ALBUMIN  4.5 09/22/2020 0927   AST 26 07/30/2023 0141   ALT 22  07/30/2023 0141   ALKPHOS 42 07/30/2023 0141   BILITOT 1.2 07/30/2023 0141   BILITOT <0.2 09/22/2020 0927   GFRNONAA >60 07/30/2023 0141   GFRAA >60 08/02/2016 1416       Component Value Date/Time   WBC 8.3 07/30/2023 0141   RBC 5.08 07/30/2023 0141   HGB 15.3 07/30/2023 0145   HGB 15.2 07/26/2023 1531   HCT 45.0 07/30/2023 0145   HCT 45.0 07/26/2023 1531   PLT 228 07/30/2023 0141   PLT 244 07/26/2023 1531   MCV 87.8 07/30/2023 0141   MCV 90 07/26/2023 1531   MCH 30.9 07/30/2023 0141   MCHC 35.2 07/30/2023 0141   RDW 12.8 07/30/2023 0141   RDW 13.1 07/26/2023 1531   LYMPHSABS 2.7 07/30/2023 0141   LYMPHSABS 2.3 07/26/2023 1531   MONOABS 0.6 07/30/2023 0141   EOSABS 0.4 07/30/2023 0141   EOSABS 0.4 07/26/2023 1531   BASOSABS 0.1 07/30/2023 0141   BASOSABS 0.1 07/26/2023 1531    Lithium  Lvl  Date Value Ref Range Status  05/25/2021 0.5 0.5 - 1.2 mmol/L Final    Comment:    A concentration of 0.5-0.8 mmol/L is advised for long-term use; concentrations of up to 1.2 mmol/L may be necessary during acute treatment.                                  Detection Limit = 0.1                           <0.1 indicates None Detected      Lab Results  Component Value Date   VALPROATE 51 05/30/2019     .res Assessment: Plan:    Kavaughn was seen today for follow-up, anxiety, depression, fatigue, add and sleeping problem.  Diagnoses and all orders for this visit:  Bipolar disorder with severe depression (HCC)  Generalized anxiety disorder  Attention deficit hyperactivity disorder (ADHD), predominantly inattentive type  Obstructive sleep apnea hypopnea, severe  Insomnia due to mental condition   30 min with patient and his wife. 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  Multiple medication failures.  Likely elements of PTSD complicating treatment.  Rec counseling .  Likely would benefit more with EMDR.  Consider Helayne Skinner.   Counseling 25 min:  disc  PTSD sx.  Disc how that can compromise response with psych meds and potential synergism. Disc types of techniques and esp EMDR.  No opiates for several mos.  Better energy.   OSA severe on CPAP as of early June 2024.  Try nasal pillows mask.  option Retry pramipexole (highest was 1.5 mg daily) but hx restlessness with it. Option doxazosin for NM Consider resuming lamotrigine  for depression.  DC Vyvanse  DT NR Return to Adderall XR 30 AM and IR  15 mg PM    Depakote  ER 1500 mg HS.    Ok resume lamotirigine 50 mg for 2 weeks then 100 mg for dep.  Took it before.  Continue Vraylar for now 1.5    Continue lithium  CR 450 Mg HS  Increased gabapentin  for RLS, anxiety, sleep.  1200 mg pm and 600-900 mg BID.  Trial sertraline 100 for TR anxiety  Stopped Ambien  and no further RX for it.  Seroquel  as need for sleep 50-100 mg HS  Discussed potential benefits, risks, and side effects of stimulants with patient to include increased heart rate, palpitations, insomnia, increased anxiety, increased irritability, or decreased appetite.  Instructed patient to contact office if experiencing any significant tolerability issues.  Discussed potential metabolic side effects associated with atypical antipsychotics, as well as potential risk for movement side effects. Advised pt to contact office if movement side effects occur.   Follow-up   Discussed side effects of each medication.  Lorene Macintosh MD, DFAPA   Please see After Visit Summary for patient specific instructions. Stop Sunosi  Add Vyvanse  1 in the morning with Adderall XR.  Increase gabapentin  for anxiety and sleep to 600-900 mg twice daily and 1200 mg at night.    Stop Vraylar and switch to Rexulti 1 mg in the morning.  Try nasal pillows mask bc claustrophobia.  Lorene Macintosh, MD, DFAPA   Future Appointments  Date Time Provider Department Center  11/07/2023  2:00 PM Dohmeier, Dedra, MD [facility] None       No orders of the  defined types were placed in this encounter.   -------------------------------bi

## 2023-09-11 ENCOUNTER — Other Ambulatory Visit (HOSPITAL_COMMUNITY): Payer: Self-pay

## 2023-10-05 ENCOUNTER — Other Ambulatory Visit: Payer: Self-pay | Admitting: Psychiatry

## 2023-10-06 ENCOUNTER — Encounter: Payer: Self-pay | Admitting: Psychiatry

## 2023-10-06 ENCOUNTER — Telehealth: Admitting: Psychiatry

## 2023-10-06 ENCOUNTER — Other Ambulatory Visit (HOSPITAL_COMMUNITY): Payer: Self-pay

## 2023-10-06 DIAGNOSIS — G2581 Restless legs syndrome: Secondary | ICD-10-CM

## 2023-10-06 DIAGNOSIS — G4733 Obstructive sleep apnea (adult) (pediatric): Secondary | ICD-10-CM | POA: Diagnosis not present

## 2023-10-06 DIAGNOSIS — F900 Attention-deficit hyperactivity disorder, predominantly inattentive type: Secondary | ICD-10-CM | POA: Diagnosis not present

## 2023-10-06 DIAGNOSIS — F5105 Insomnia due to other mental disorder: Secondary | ICD-10-CM | POA: Diagnosis not present

## 2023-10-06 DIAGNOSIS — F314 Bipolar disorder, current episode depressed, severe, without psychotic features: Secondary | ICD-10-CM | POA: Diagnosis not present

## 2023-10-06 DIAGNOSIS — R5382 Chronic fatigue, unspecified: Secondary | ICD-10-CM | POA: Diagnosis not present

## 2023-10-06 DIAGNOSIS — F411 Generalized anxiety disorder: Secondary | ICD-10-CM | POA: Diagnosis not present

## 2023-10-06 MED ORDER — SERTRALINE HCL 100 MG PO TABS
150.0000 mg | ORAL_TABLET | Freq: Every day | ORAL | 0 refills | Status: DC
  Filled 2023-10-06: qty 135, 90d supply, fill #0

## 2023-10-06 MED ORDER — LAMOTRIGINE 100 MG PO TBDP: 200.0000 mg | ORAL_TABLET | Freq: Every day | ORAL | Status: AC

## 2023-10-06 MED ORDER — QUETIAPINE FUMARATE 50 MG PO TABS
50.0000 mg | ORAL_TABLET | Freq: Every day | ORAL | 1 refills | Status: DC
  Filled 2023-10-06: qty 30, 30d supply, fill #0

## 2023-10-06 MED ORDER — LORAZEPAM 1 MG PO TABS
1.0000 mg | ORAL_TABLET | Freq: Four times a day (QID) | ORAL | 0 refills | Status: DC
  Filled 2023-10-06: qty 120, 30d supply, fill #0

## 2023-10-06 MED ORDER — LITHIUM CARBONATE ER 450 MG PO TBCR
450.0000 mg | EXTENDED_RELEASE_TABLET | Freq: Every day | ORAL | 0 refills | Status: AC
  Filled 2023-10-06: qty 90, 90d supply, fill #0

## 2023-10-06 MED ORDER — AMPHETAMINE-DEXTROAMPHET ER 30 MG PO CP24
30.0000 mg | ORAL_CAPSULE | Freq: Every day | ORAL | 0 refills | Status: DC
  Filled 2023-10-06: qty 30, 30d supply, fill #0

## 2023-10-06 NOTE — Patient Instructions (Addendum)
 Reduce gabapentin  to 300 mg 1 twice daily and 4 at night to reduce fatigue  Increase sertraline  to 150 mg daily.    Increase lamotrigine  to 200 mg daily.  Get labs: vitamin D , Ferritin, TSH,   Contact Helayne Skinner for counseling

## 2023-10-06 NOTE — Progress Notes (Addendum)
 Adonias ONEIDA Molt [id number] 1970/06/17 53 y.o.  Video Visit via My Chart  I connected with pt by video using My Chart and verified that I am speaking with the correct person using two identifiers.   I discussed the limitations, risks, security and privacy concerns of performing an evaluation and management service by My Chart  and the availability of in person appointments. I also discussed with the patient that there may be a patient responsible charge related to this service. The patient expressed understanding and agreed to proceed.  I discussed the assessment and treatment plan with the patient. The patient was provided an opportunity to ask questions and all were answered. The patient agreed with the plan and demonstrated an understanding of the instructions.   The patient was advised to call back or seek an in-person evaluation if the symptoms worsen or if the condition fails to improve as anticipated.  I provided 30 minutes of video time during this encounter.  The patient was located at home and the provider was located office. Session 330-400  Subjective:   Patient ID:  Osmin MICHELE KERLIN is a 53 y.o. (DOB March 06, 1971) male.  Chief Complaint:  Chief Complaint  Patient presents with   Follow-up   Depression   Anxiety   Sleeping Problem   Fatigue    HPI Rad T Kryder presents to the office today for follow-up of bipolar depression and panic disorder and ADD.  On 12/24/21 started Trintellix  and up to 10 mg daily.  And changed Adderall to concerta  54 mg Am No sig effect from Concerta  54. Maybe a little loss of energy with switch. NoSE with it. NO SE with trintellix  change. No change in energy.   F in law died.   Maybe more depressed than he's gotten used to being.  Hard to make himself go out.  Can't fake his way through it right now. Anxiety settled after adjusted to Trintellix  but still more.  Anxiety about the same as when on Paxil.  Sleep pattern is same but if anything more  sleep than normal.  But some days EFA.  Harder to wake in the morning.   Mirtazapine  plus Quiviq helps initial insomnia.  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 Increase Trintellix  to 20 mg daily for treatment resistant bipolar depression  03/18/22 appt noted: Sleep better off Xtampza  and on morphine  ER 15 BID. No benefit or SE with Trintellix  20 mg daily. Anxiety is ok but not great.  Not the thing that's keeping me from moving DT depression and fatigue. Chronic apathy so why bother doing anything.  Will get anxious thinking about the downward spiral. Concerta  72 mg no better than Adderall.  Doesn't take it daily bc doesn't seem to help.  Stopped it for a couple of weeks.  Did take it consistently at 54 mg .  Didn't notice much from the 72 mg being different. Thinks he originally got benefit from Adderall. Has decided the pain meds make him tired and apathetic and wants to try to get off pain meds.   Sleep with Ambien  occ. Plan: Concerta  without help so return to Adderall.   Increase Trintellix  to 30 mg daily for treatment resistant bipolar depression for 2 weeks.  If NR stop it.  This is off label. Continue mirtazapine  30 mg HS.   OK prn zolpidem   04/08/22 appt noted: Overused the Ambien   and out.     Took 30 mg Trintellix  for a week or so and stopped it DT NR. Stopped pain pills for a couple of week and used Kratom to help with withdrawal.  Went to doctor yesterday and he got immediate release morphine  15 mg and got #120 pills. Withdrawal was better within a week or so.  Current meds:  Adderall XR 20 daily, flexeril  10 mg HS, Quiviviq, Depakote  ER 1000 mg HS, lamotrigine  100 mg daily, lithium  Cr 450 HS, mirtazapine  30 mg HS, stopped Trintellix , out of Ambien . Better overall mood since off the opiate.  Adderall seems more effective off the opiate.  Would like to go back up on the amount  to 30 XR and 15 IR daily. Beth says he's been up out of bed more than he was and applied for some jobs  so she notices some benefit. Last couple of weeks anxiety is pretty good.   Plan: Concerta  without help so return to Adderall.   DC Trintellix  Agrees to retry Latuda  but go really low to start bc got agitated on it before. Continue mirtazapine  30 mg HS.   DC Ambien  and no further RX for it.  05/18/2022 appointment noted: About 2 to 3 weeks ago stopped Latuda  20 mg because he felt agitated. Wife reports she is not doing well with regard to depression. 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  W doesn't think his thinking realistic.  He's been in bed for a week off Latuda . Got scammed in job situation.  Haven't felt as bad as this in a long time.  Feels like a disappointment.  Plan Amantadine  100 mg HS for a week then 100 mg BID Concerta  without help so return to Adderall.   DC  mirtazapine  30 mg HS Seroquel  25-50 mg HS   DC Ambien  and no further RX for it.  06/17/22 appt noted: Taking amantadine  twice daily.  Didn't really notice anything as far as benefit. Anxiety has been up more on fire inside and feels afraid like something bad is about to happen.  Worse 3 days last week and 3 days this week crippling at times and usually mid afternoon.   Still too sleepy and tired to do anything and this causes anxiety.  Energy better briefly but down again.   Sleep better with Seroquel  at nnight and more benefit.  But trouble waking up More desire to be engaged with people off all opiates.   Plan: DC Amantadine  100 Start Vraylar 1.5 mg QOD  Adderall.   Seroquel  25-50 mg HS   DC Ambien  and no further RX for it. Increase Depakote  ER 1500 mg HS  07/22/22 appt noted: No opiate for almost 2 mos.  Energy is better and once awake can stay awake. Pain is roughly the same but more consistent than when on opiates.  And can have spikes. Kratom helps without SE and occ delta 8 on separate occ and it helps  pain. No crazy buzz like THC. Meds: Vraylar 1.5 mg daily, Vit D 50 K week, Seroquel  50 mg HS, Depakote  Er 1500 HS, lamotrigine  100 BID , lithium  ER 450 HS, Adderall XR 30 AM and IR 10 mg PM Started testosterone  which might help mood and energy too.   More consistent.   A little less dep and a little better energy.  Some hangover with Seroquel  and hard to get up 7 AM.  Sleep much better with Seroquel .  Sometimes sleeps through night.  No other SE.  No akathisia. Not much anxiety at all lately.   Dep 4/10.  Definitely best in a long time.    10/07/22 appt noted: CPAP causes anxiety dealing with it using gabapentin . Sleep 5-6 hours with occ of more.   Meds as above  gabapentin  as noted. For the most part ok.  A lot of trouble getting up in the AM chronically but worse the last couple of weeks.  Good bit of fatigue gradually better.   Adderall doesn't wake him up.   Managing to do a few things during the day now.  Takes 1 and 1/2 hour to go to sleep chronically.  To sleep about 1130.  Awakens 2-3 times per night and night eat and then gets up late about noon.  In bed 12 hours daily.  Will sleep solid 7-10 AM regularly.   Always hard to wake up but may be worse with Seroquel .   Been stressing some and a little low side with dep.  Still has some panic.  Situational usually.   SE mild akathisia  12/30/22 appt noted: Not needing Seroquel  usually for sleep.  Gabapentin  now helping that and anxiety at 600-900 mg daily and helps pain some .  Some hangover but less than Seroquel . Most of time more alert with Adderall and Sunosi  together.  However some days still drags.  No clear reason.  Last night slept well but still dragging today and then just wants to close his eyes.  He's having trouble functioning on days like this.   No SE  with it.   Mood up and down but more up than down since here.  When down is more isolated and withdrawn but usually brief.  More normal days with current med regimen.  Fatigue  will cause him to get down. Sept is usually down month.  Was better with this this year.  Wife has noted his upswings in mood.  Can be a bit impulsive with upswings but not severe.  Can return things if needed.   10 yr ago back injury and can't do what he did back then.   SE some hangover with gabapentin  seems unrelated to amount he takes.  Positional tremor with VPA.   Started injx testosterone  for a month. Doing as well as the gel.  Libido is better.  Maybe recover better from physical activity.   Plan: DC Vraylar 1.5 mg QD bc ? Benefit and polypharmacy.    02/03/23 appt noted: Psych meds: stopped Vraylar.  Adderall XR 30 & IR 15 mg PM, Sunosi  150, Depakote  ER 1500 HS, gabapentin  300 TID, lamotrigine  100 BID, Eskalith  CR 450 HS.    Not taking Seroquel  for sleep.  SE hangover Stopped Vraylar and more dep and irritable, increased fear feeling on sense of doom.  Mood definitely worse off the Vraylar.   Situational stress.  Still don't have a job.  A lot of trouble carrying through on things.  Fatigue with dep.  Napping a couple of times per day.  Poor function making marriage worse.      06/16/23 appt noted:  with wife Psych meds: Seroquel  100 BID, clonidine  0.1 BID, Vraylar 1.5, no lithium  until last night. No SE. Anxious worried and down. Clonidine  helped little with anxiety. He has been very agitated since being off his main mood stabilizing medications Depakote  and lamotrigine .  He is having a great deal of anxiety.  He has trouble sitting still.  He has trouble sleeping.  He is motivated to do anything  that may be helpful.  He is not currently suicidal. See the attached notes for details around the events since November 2024.  He has had a course of ECT at [facility].  However he had severe agitation and confusion coming out of anesthesia.  It was so severe that he was somewhat violent for brief periods of time while he was confused.  He is not seen to gain significant benefits from the ECT.   [facility] is reconsidering whether to continue it but it appears that will be discontinued. He is having shoulder surgery next Tuesday. Plan: Cont Adderall XR 30 AM and IR 15 mg PM   Continue Seroquel  100 mg twice daily Resume Depakote  ER 1500 mg HS.   Continue lithium  CR 450 Mg HS Vraylar 1.5 mg   07/05/23 Disc case with wife:   Currently Lithium  Depakote  1000 mg HS Clonidine  0.1 mg BID and 0.1 mg twice daily prn Clozapine  50 mg HS Ativan  2 mg BID.  Plan: increase clozapine  100 mg HS and then to 150 mg HS asap.  It will not help at lower dose than that. Spread out Ativan  1 mg QID. Increase clonidine  to 0.2 mg BID and even 0.3 mg BID if BP and anxiety are still high.  Lorene Macintosh, MD, DFAPA  08/18/23 appt noted:  Psych meds: Adderall XR 30 every morning with Adderall 15 mg in the afternoon, Sunosi  150 am,  Vraylar 1.5 mg daily, no clonidine  0.1 mg daily, Depakote  ER 1500 nightly, gabapentin  300 mg 3 times daily as needed pain, lithium  ER 450 nightly, reduced Ativan  1 mg 2-3 times daily.   Seroquel  irregularly 100 HS. Stopped clozapine . 2 ER visits within 10 days complaining of SE clozapine  trouble swallowing.  Stopped clonidine .  He thinks it made NM worse; read about SE.  Had NM of how he would die.   Still having nightly NM; tends to be when upset of any kind, dep or angry.  Now over was sure he was going to die: when he was having trouble swallowing.  Was afraid to go to sleep for a couple of weeks after stopping clozapine .    Gabapentin  does calm him down.   Ongoing STM px some are worse since ECT. Ongoing anxiety including daytime anxiety.  Worrying over can't do things bc shoulder surgery. Trouble sleeping for a couple of days then crashes.  Fear is keeping him from sleeping.  Doesn't think it is entirely conscious but fears NM.  Then awakens panicked.  Afraid to sleep gradually.  Afraid to use CPAP bc suffocation fears.  Was using CPAP more consistently in the early days of use.    Wife sees him as scattered.  Not finishing things.   Getting more dep and unstable emotionally, tearful over anything negative.  Irritable.  Plan: Stop Sunosi  Add Vyvanse  50 in the morning with Adderall XR. Increase gabapentin  for anxiety and sleep to 600-900 mg twice daily and 1200 mg at night.   Stop Vraylar and switch to Rexulti 1 mg in the morning. Try nasal pillows mask bc claustrophobia.  09/08/23 appt noted:  Psych meds: Vyvanse  50 AM with Adderall XR mg AM, stopped Sunosi  150 am,  continued Vraylar 1.5, no Rexulti,   Depakote  ER 1500 nightly, gabapentin  300 mg 1200 mg PM and 600 mg 2 times daily as needed pain, lithium  ER 450 nightly, reduced Ativan  1 mg 2-3 times daily.   Seroquel  irregularly 100 HS. No effect noticed with med changes.  Still exhausted  and sleepy and hard to wake up.  No benefit Vyvanse .  Spending most of the day in bed. He No sig akathisia.   CC Anxiety is probably worse than last time.  Couple occ unable to leave house bc so anxious.  Anxiety worse makes him more dep.  Sleep better with Ativan  and Seroquel .  Hangover with it.  Wife says sleeping a lot. Feels frightened about sleep and fear choking but not really having the px he had before on clozapine .   SE hangover ? NM Reviewed prior meds.   Normal BP in June. ? Was elevated on Sunosi  and Adderall.  Plan: DC Vyvanse  DT NR Return to Adderall XR 30 AM and IR 15 mg PM   Depakote  ER 1500 mg HS.   Ok resume lamotirigine 50 mg for 2 weeks then 100 mg for dep.  Took it before. Continue Vraylar for now 1.5  Continue lithium  CR 450 Mg HS Increased gabapentin  for RLS, anxiety, sleep.  1200 mg pm and 600-900 mg BID. Trial sertraline  100 for TR anxiety Stopped Ambien  and no further RX for it. Seroquel  as need for sleep 50-100 mg HS  10/06/23 appt noted: virtual Med: Adderall XR 30 mg every morning, Vraylar 1.5 daily, Depakote  ER 1500 nightly, gabapentin  900 mg twice daily and 1200 nightly, lamotrigine  100, lithium  ER  450, quetiapine  50 nightly, sertraline  100. Ativan  1 mg TID-QID Energy level plumetted again.  Want to sleep .   Apathy and lack of motivation.   Get wound up easily.  Hard to describe.  The more wound up the less likely he is to do anything.  No sig crying.   No real change with switch back to Adderall.   Maybe gabapentin  increase made him more tired.  No effect from sertraline  noticeable.  Sleep with Seroquel  change.   Has gone to store some and did go swimming but has to force himself. Prmary worry not having a job.   Dep 7-8/10.  Maybe a little worse.   Asks for lorazepam  for sleep and excessive worry or social intolerance/anxiety.   No SE other than sleepiness or tiredness.   Pain about 4/10.   Just wants to sleep to escape.    Past Psychiatric Medication Trials: Depakote  1500 Oxcarbazepine hyponatremia Lamotrigine  100 mg twice daily Lithium  900 tremor  Saphris 20 sed Seroquel  600 sedation Olanzapine Vraylar 3 mg no response and some akathisia Rexulti ? effect Caplyta restlessness Latuda  questionable dose, brief and possibly agitated, 20 mg retry agitated & angry Clozapine  SE at low dose  trouble swallowing  Adderall 45, modafinil 300 NR, Concerta  72 mg AM poor response, Vyvanse  50  Pramipexole no response and restless Amantadine  100 BID NR  Paroxetine 40 sleepy, fluoxetine, remote Zoloft  nortriptyline anxiety,  Wellbutrin no response,  Auvelity no response Trintellix  30 for a week, NR  Gabapentin  4000 mg no response for pain.  Benefit anxiety and sleep Lyrica SE Clonazepam  0.5 mg twice daily, alprazolam sedation Clonidine  0.2 NM worse Trazodone no response Ambien  and Ambien  CR abuse,  hydroxyzine, Lunesta, mirtazapine  30,  Thorazine 25 side effects of night eating, Seroquel  50 for sleep Quiviq Lyrica  ECT 2025 unsuccessful with difficulty from anesthesia  Therapist Fred May EMDR  Psychiatric hospitalization in 1997  Flowsheet Row ED from 06/29/2023 in  [facility] at [facility] Admission (Discharged) from 06/20/2023 in [place] LONG PERIOPERATIVE AREA UC from 10/31/2021 in [facility] RISK CATEGORY No Risk No  Risk No Risk     Review of Systems:  Review of Systems  Constitutional:  Positive for fatigue.  Musculoskeletal:  Positive for arthralgias and back pain.  Neurological:  Positive for tremors.  Psychiatric/Behavioral:  Positive for agitation, decreased concentration, dysphoric mood and sleep disturbance. The patient is nervous/anxious.     Medications: I have reviewed the patient's current medications.  Current Outpatient Medications  Medication Sig Dispense Refill   acetaminophen  (TYLENOL ) 325 MG tablet Take 650 mg by mouth 2 (two) times daily as needed for moderate pain.     albuterol (VENTOLIN HFA) 108 (90 Base) MCG/ACT inhaler Inhale 2 puffs into the lungs every 4 (four) hours as needed for wheezing or shortness of breath.     cariprazine (VRAYLAR) 1.5 MG capsule Take 1 capsule by mouth daily.     divalproex  (DEPAKOTE  ER) 500 MG 24 hr tablet Take 3 tablets (1,500 mg total) by mouth at bedtime. 270 tablet 0   gabapentin  (NEURONTIN ) 300 MG capsule Take 3 capsules (900 mg total) by mouth 2 (two) times daily AND 4 capsules (1,200 mg total) every evening. 300 capsule 1   ibuprofen (ADVIL,MOTRIN) 200 MG tablet Take 400 mg by mouth daily as needed for moderate pain. (Patient taking differently: Take 200-600 mg by mouth daily as needed for moderate pain (pain score 4-6) or mild pain (pain score 1-3).)     lidocaine  (LIDODERM ) 5 % Apply 1 patch topically daily as needed (BACK PAIN).     Multiple Vitamin (MULTI VITAMIN) TABS Take 0.5 tablets by mouth 2 (two) times daily.     testosterone  cypionate (DEPOTESTOSTERONE CYPIONATE) 200 MG/ML injection Inject 0.5 mLs (100 mg total) into the muscle once a week. 10 mL 5   testosterone  cypionate (DEPOTESTOSTERONE CYPIONATE) 200 MG/ML  injection Inject 0.5 mLs (100 mg total) into the muscle once a week. 10 mL 5   Vitamin D , Ergocalciferol , (DRISDOL ) 1.25 MG (50000 UNIT) CAPS capsule Take 1 capsule (50,000 Units total) by mouth every 7 (seven) days. (Patient taking differently: Take 50,000 Units by mouth once a week. Take 50,000 units by mouth every week on Tuesday) 15 capsule 1   amphetamine -dextroamphetamine  (ADDERALL XR) 30 MG 24 hr capsule Take 1 capsule (30 mg total) by mouth daily. 30 capsule 0   lamoTRIgine  100 MG TBDP Take 2 tablets (200 mg total) by mouth daily in the afternoon.     lithium  carbonate (ESKALITH ) 450 MG ER tablet Take 1 tablet (450 mg total) by mouth at bedtime. 90 tablet 0   LORazepam  (ATIVAN ) 1 MG tablet Take 1 tablet (1 mg total) by mouth in the morning, at noon, in the evening, and at bedtime. 120 tablet 0   QUEtiapine  (SEROQUEL ) 50 MG tablet Take 1 tablet (50 mg total) by mouth at bedtime. 30 tablet 1   sertraline  (ZOLOFT ) 100 MG tablet Take 1.5 tablets (150 mg total) by mouth daily. 135 tablet 0   No current facility-administered medications for this visit.    Medication Side Effects: None  Allergies:  Allergies  Allergen Reactions   Trileptal [Oxcarbazepine] Other (See Comments)    Low sodium    Past Medical History:  Diagnosis Date   ADHD (attention deficit hyperactivity disorder)    Anxiety    Arthritis    knee and back   Bipolar 1 disorder (HCC)    Complication of anesthesia    Constipation    Depression    bi polar   GERD (gastroesophageal reflux disease)    Head  injury    as a child ;had stitches in head   Headache    migraines   Hypertension    Mental disorder    Bipolar   PONV (postoperative nausea and vomiting)    Sleep apnea    wears Cpap   Testosterone  deficiency    Trigeminal neuralgia of left side of face 04/01/2019   Left V2    Past Medical History, Surgical history, Social history, and Family history were reviewed and updated as appropriate.   Please see  review of systems for further details on the patient's review from today.   Objective:   Physical Exam:  There were no vitals taken for this visit.  Physical Exam Neurological:     Mental Status: He is alert and oriented to person, place, and time.     Cranial Nerves: No dysarthria.  Psychiatric:        Attention and Perception: Attention and perception normal.        Mood and Affect: Mood is anxious and depressed. Affect is not blunt or tearful.        Speech: Speech normal.        Behavior: Behavior is not agitated or aggressive. Behavior is cooperative.        Thought Content: Thought content normal. Thought content is not paranoid or delusional. Thought content does not include homicidal or suicidal ideation. Thought content does not include suicidal plan.        Cognition and Memory: Cognition and memory normal.        Judgment: Judgment normal.     Comments: Insight intact Ongoing dep and anxious.      Lab Review:     Component Value Date/Time   NA 139 07/30/2023 0145   NA 139 05/25/2021 1501   K 3.5 07/30/2023 0145   CL 102 07/30/2023 0145   CO2 21 (L) 07/30/2023 0141   GLUCOSE 121 (H) 07/30/2023 0145   BUN 17 07/30/2023 0145   BUN 11 05/25/2021 1501   CREATININE 1.10 07/30/2023 0145   CALCIUM 9.2 07/30/2023 0141   PROT 6.9 07/30/2023 0141   PROT 6.9 09/22/2020 0927   ALBUMIN  4.2 07/30/2023 0141   ALBUMIN  4.5 09/22/2020 0927   AST 26 07/30/2023 0141   ALT 22 07/30/2023 0141   ALKPHOS 42 07/30/2023 0141   BILITOT 1.2 07/30/2023 0141   BILITOT <0.2 09/22/2020 0927   GFRNONAA >60 07/30/2023 0141   GFRAA >60 08/02/2016 1416       Component Value Date/Time   WBC 8.3 07/30/2023 0141   RBC 5.08 07/30/2023 0141   HGB 15.3 07/30/2023 0145   HGB 15.2 07/26/2023 1531   HCT 45.0 07/30/2023 0145   HCT 45.0 07/26/2023 1531   PLT 228 07/30/2023 0141   PLT 244 07/26/2023 1531   MCV 87.8 07/30/2023 0141   MCV 90 07/26/2023 1531   MCH 30.9 07/30/2023 0141   MCHC  35.2 07/30/2023 0141   RDW 12.8 07/30/2023 0141   RDW 13.1 07/26/2023 1531   LYMPHSABS 2.7 07/30/2023 0141   LYMPHSABS 2.3 07/26/2023 1531   MONOABS 0.6 07/30/2023 0141   EOSABS 0.4 07/30/2023 0141   EOSABS 0.4 07/26/2023 1531   BASOSABS 0.1 07/30/2023 0141   BASOSABS 0.1 07/26/2023 1531    Lithium  Lvl  Date Value Ref Range Status  05/25/2021 0.5 0.5 - 1.2 mmol/L Final    Comment:    A concentration of 0.5-0.8 mmol/L is advised for long-term use; concentrations of up to 1.2 mmol/L  may be necessary during acute treatment.                                  Detection Limit = 0.1                           <0.1 indicates None Detected      Lab Results  Component Value Date   VALPROATE 51 05/30/2019     .res Assessment: Plan:    Franklyn was seen today for follow-up, depression, anxiety, sleeping problem and fatigue.  Diagnoses and all orders for this visit:  Bipolar disorder with severe depression (HCC) -     QUEtiapine  (SEROQUEL ) 50 MG tablet; Take 1 tablet (50 mg total) by mouth at bedtime. -     lithium  carbonate (ESKALITH ) 450 MG ER tablet; Take 1 tablet (450 mg total) by mouth at bedtime. -     lamoTRIgine  100 MG TBDP; Take 2 tablets (200 mg total) by mouth daily in the afternoon.  Generalized anxiety disorder -     sertraline  (ZOLOFT ) 100 MG tablet; Take 1.5 tablets (150 mg total) by mouth daily. -     LORazepam  (ATIVAN ) 1 MG tablet; Take 1 tablet (1 mg total) by mouth in the morning, at noon, in the evening, and at bedtime.  Attention deficit hyperactivity disorder (ADHD), predominantly inattentive type -     amphetamine -dextroamphetamine  (ADDERALL XR) 30 MG 24 hr capsule; Take 1 capsule (30 mg total) by mouth daily.  Obstructive sleep apnea hypopnea, severe  Insomnia due to mental condition -     QUEtiapine  (SEROQUEL ) 50 MG tablet; Take 1 tablet (50 mg total) by mouth at bedtime.  Chronic fatigue -     Cancel: TSH -     Cancel: VITAMIN D  25 Hydroxy (Vit-D  Deficiency, Fractures) -     Cancel: Ferritin -     Ferritin -     TSH -     VITAMIN D  25 Hydroxy (Vit-D Deficiency, Fractures)  Restless legs syndrome -     Cancel: Ferritin -     Ferritin    45 min with patient and his wife. We discussed his treatment resistant bipolar depression with severe anxiety and fatigue.  Multiple medication failures.  Likely elements of PTSD complicating treatment.  Rec counseling .  Likely would benefit more with EMDR.  Consider Helayne Skinner.    No opiates for several mos.  Better energy initially   OSA severe on CPAP as of early June 2024.  Try nasal pillows mask.  option Retry pramipexole (highest was 1.5 mg daily) but hx restlessness with it. Less often NM Consider resuming lamotrigine  for depression.  Disc unfortunately necessary to use polypharmacy.  Disc risk mania with sertraline  but anxiety has been unmanageable.  Adderall XR 30 AM and IR 15 mg PM    Depakote  ER 1500 mg HS.    increase lamotirigine 100 mg BID for dep.  Took it before.  Continue Vraylar for now 1.5    Continue lithium  CR 450 Mg HS  Increased gabapentin  for RLS, anxiety, sleep.  1200 mg pm and 600-900 mg BID.  Trial sertraline  100 for TR anxiety  Stopped Ambien  and no further RX for it.  Seroquel  as need for sleep 50-100 mg HS  Discussed potential benefits, risks, and side effects of stimulants with patient to include increased heart rate, palpitations, insomnia, increased anxiety,  increased irritability, or decreased appetite.  Instructed patient to contact office if experiencing any significant tolerability issues.  Discussed potential metabolic side effects associated with atypical antipsychotics, as well as potential risk for movement side effects. Advised pt to contact office if movement side effects occur.   Rec second psych opinion at [facility] or [facility] for TR bipolar depression.  Follow-up 1 mos  Discussed side effects of each medication.  Lorene Macintosh MD,  DFAPA Reduce gabapentin  to 300 mg 1 twice daily and 4 at night to reduce fatigue  Increase sertraline  to 150 mg daily.    Increase lamotrigine  to 200 mg daily.  Get labs: vitamin D , Ferritin, TSH,   Contact Helayne Skinner for counseling  Please see After Visit Summary for patient specific instructions.  Lorene Macintosh, MD, DFAPA   Future Appointments  Date Time Provider Department Center  11/07/2023  2:00 PM Dohmeier, Dedra, MD [facility] None       Orders Placed This Encounter  Procedures   Ferritin   TSH   VITAMIN D  25 Hydroxy (Vit-D Deficiency, Fractures)    -------------------------------bi

## 2023-10-09 ENCOUNTER — Other Ambulatory Visit (HOSPITAL_COMMUNITY): Payer: Self-pay

## 2023-10-16 ENCOUNTER — Other Ambulatory Visit: Payer: Self-pay

## 2023-10-16 ENCOUNTER — Other Ambulatory Visit (HOSPITAL_COMMUNITY): Payer: Self-pay

## 2023-10-25 ENCOUNTER — Other Ambulatory Visit (HOSPITAL_COMMUNITY): Payer: Self-pay

## 2023-11-01 ENCOUNTER — Other Ambulatory Visit: Payer: Self-pay | Admitting: Psychiatry

## 2023-11-01 ENCOUNTER — Other Ambulatory Visit (HOSPITAL_COMMUNITY): Payer: Self-pay

## 2023-11-01 DIAGNOSIS — F314 Bipolar disorder, current episode depressed, severe, without psychotic features: Secondary | ICD-10-CM

## 2023-11-01 DIAGNOSIS — F411 Generalized anxiety disorder: Secondary | ICD-10-CM

## 2023-11-03 ENCOUNTER — Other Ambulatory Visit (HOSPITAL_COMMUNITY): Payer: Self-pay

## 2023-11-03 MED ORDER — LORAZEPAM 1 MG PO TABS
1.0000 mg | ORAL_TABLET | Freq: Four times a day (QID) | ORAL | 0 refills | Status: AC
  Filled 2023-11-03: qty 120, 30d supply, fill #0

## 2023-11-07 ENCOUNTER — Other Ambulatory Visit: Payer: Self-pay | Admitting: Psychiatry

## 2023-11-07 ENCOUNTER — Ambulatory Visit (INDEPENDENT_AMBULATORY_CARE_PROVIDER_SITE_OTHER): Admitting: Neurology

## 2023-11-07 ENCOUNTER — Other Ambulatory Visit (HOSPITAL_COMMUNITY): Payer: Self-pay

## 2023-11-07 ENCOUNTER — Encounter: Payer: Self-pay | Admitting: Neurology

## 2023-11-07 VITALS — BP 126/87 | HR 85 | Ht 74.0 in | Wt 238.0 lb

## 2023-11-07 DIAGNOSIS — F900 Attention-deficit hyperactivity disorder, predominantly inattentive type: Secondary | ICD-10-CM

## 2023-11-07 DIAGNOSIS — G2401 Drug induced subacute dyskinesia: Secondary | ICD-10-CM | POA: Diagnosis not present

## 2023-11-07 DIAGNOSIS — R4781 Slurred speech: Secondary | ICD-10-CM | POA: Insufficient documentation

## 2023-11-07 DIAGNOSIS — F332 Major depressive disorder, recurrent severe without psychotic features: Secondary | ICD-10-CM | POA: Diagnosis not present

## 2023-11-07 MED ORDER — AMPHETAMINE-DEXTROAMPHET ER 30 MG PO CP24
30.0000 mg | ORAL_CAPSULE | Freq: Every day | ORAL | 0 refills | Status: DC
  Filled 2023-11-07: qty 30, 30d supply, fill #0

## 2023-11-07 NOTE — Progress Notes (Signed)
 [facility]  Provider:  Dr Makael Stein Referring Provider: Doretha Folks, MD Primary Care Physician:  Seabron Lenis, MD  Chief Complaint  Patient presents with   New Patient (Initial Visit)    Pt with wife, rm 1, states that went to ER back in May was taking Clozapine  and felt there to be side effects with difficulty swallowing r/t that medication. He said he got to the point where he couldn't swallow and couldn't breathe.. went to ER. States that while there had a migraine come on which they gave migraine cocktail for. Symptoms were better after they gave ativan . In spring he was undergoing ECT therapy. He was  in the ED advised to follow up with neurology.     HPI:  Shawn Cervantes is a 53 y.o. male and seen here on 11/07/2023 upon referral from ED Dr. Doretha for a Consultation/ Evaluation of  DYSPARTHRIA transiently unable  (on 07-29-2023  in the ED)  to answer questions clearly , was upset, anxious and agitated  , felt he wasn't able to speak correctly, had dysphagia, tardive dyskinesia, couldn't get the words out, but had the word on his mind. He was dizzy and walk was unsteady .    My saliva was just flowing and I felt I was choking  couldn't express myself, had ECT therapy and has memory loss ever since .   All resolved with the d/c of Clozepin. Treatment resistant depression. Hypersomnia,  each day can be different in terms of mood and functional ability.  .   Clozepin caused excess saliva and drooling, then felt unable to swallow which made him panic. No MRI was done.   cognitive changes  were reported for for a while, had ECT in May 2025.    Review of Systems: Out of a complete 14 system review, the patient complains of only the following symptoms, and all other reviewed systems are negative.    Social History   Socioeconomic History   Marital status: Married    Spouse name: Not on file   Number of children: 2   Years of education: 12    Highest education level: High school graduate  Occupational History   Occupation: IT  Tobacco Use   Smoking status: Former    Current packs/day: 0.00    Types: Cigars, Cigarettes    Quit date: 10/14/2013    Years since quitting: 10.0   Smokeless tobacco: Never  Vaping Use   Vaping status: Some Days   Devices: delta 8 vape at times  Substance and Sexual Activity   Alcohol use: No   Drug use: No   Sexual activity: Not Currently    Comment: 1- 2 a week  Other Topics Concern   Not on file  Social History Narrative   Lives at home with his wife.   Right-handed.   3-4 cups caffeine per day.   Social Drivers of Corporate investment banker Strain: Not on file  Food Insecurity: Not on file  Transportation Needs: Not on file  Physical Activity: Not on file  Stress: Not on file  Social Connections: Not on file  Intimate Partner Violence: Not on file    Family History  Problem Relation Age of Onset   Hypertension Mother    Depression Mother    Anxiety disorder Mother    Alcoholism Father    Diabetes Father     Past Medical History:  Diagnosis Date   ADHD (attention deficit hyperactivity disorder)  Anxiety    Arthritis    knee and back   Bipolar 1 disorder (HCC)    Complication of anesthesia    Constipation    Depression    bi polar   GERD (gastroesophageal reflux disease)    Head injury    as a child ;had stitches in head   Headache    migraines   Hypertension    Mental disorder    Bipolar   PONV (postoperative nausea and vomiting)    Sleep apnea    wears Cpap   Testosterone  deficiency    Trigeminal neuralgia of left side of face 04/01/2019   Left V2    Past Surgical History:  Procedure Laterality Date   BACK SURGERY  2021   replaced stimulator/leads   HAND SURGERY Left    tendon repai x2   LUMBAR LAMINECTOMY Right 04/01/2013   Procedure: MICRODISCECTOMY LUMBAR LAMINECTOMY;  Surgeon: Oneil JAYSON Herald, MD;  Location: [facility] OR;  Service: Orthopedics;   Laterality: Right;  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   LUMBAR WOUND DEBRIDEMENT N/A 07/30/2014   Procedure: LUMBAR WOUND IRRIGATION AND DEBRIDEMENT,        ;  Surgeon: Alm GORMAN Molt, MD;  Location: [facility];  Service: Neurosurgery;  Laterality: N/A;   MICRODISCECTOMY LUMBAR     L4-5   MICRODISCECTOMY LUMBAR     L4-5   POSTERIOR LUMBAR FUSION 2 WITH HARDWARE REMOVAL Right 06/20/2023   Procedure: ARTHROSCOPY, SHOULDER WITH DEBRIDEMENT AND CHROMIOPLASTY;  Surgeon: Sheril Coy, MD;  Location: [facility];  Service: Orthopedics;  Laterality: Right;   SPINAL CORD STIMULATOR INSERTION N/A 09/25/2015   Procedure: LUMBAR SPINAL CORD STIMULATOR INSERTION;  Surgeon: Deward Fabian, MD;  Location: [facility];  Service: Neurosurgery;  Laterality: N/A;  LUMBAR SPINAL CORD STIMULATOR INSERTION   SUBACROMIAL DECOMPRESSION Right 06/20/2023   Procedure: DECOMPRESSION, SUBACROMIAL SPACE;  Surgeon: Sheril Coy, MD;  Location: [facility];  Service: Orthopedics;  Laterality: Right;  RIGHT SHOULDER ARTHROSCOPY   TRANSFORAMINAL LUMBAR INTERBODY FUSION (TLIF) WITH PEDICLE SCREW FIXATION 1 LEVEL N/A 07/17/2014   Procedure: Transforaminal Lumbar Interbody Fusion Lumbar four-five with pedicle screws  ;  Surgeon: Alm GORMAN Molt, MD;  Location: [facility];  Service: Neurosurgery;  Laterality: N/A;   VASECTOMY     WRIST ARTHROSCOPY     WRIST SURGERY Right    reconstution    Current Outpatient Medications  Medication Sig Dispense Refill   acetaminophen  (TYLENOL ) 325 MG tablet Take 650 mg by mouth 2 (two) times daily as needed for moderate pain.     albuterol (VENTOLIN HFA) 108 (90 Base) MCG/ACT inhaler Inhale 2 puffs into the lungs every 4 (four) hours as needed for wheezing or shortness of breath.     amphetamine -dextroamphetamine  (ADDERALL XR) 30 MG 24 hr capsule Take 1 capsule (30 mg total) by mouth daily. 30 capsule 0   cariprazine (VRAYLAR) 1.5 MG capsule Take 1 capsule by mouth daily.     divalproex   (DEPAKOTE  ER) 500 MG 24 hr tablet Take 3 tablets (1,500 mg total) by mouth at bedtime. 270 tablet 0   gabapentin  (NEURONTIN ) 300 MG capsule Take 3 capsules (900 mg total) by mouth 2 (two) times daily AND 4 capsules (1,200 mg total) every evening. 300 capsule 1   ibuprofen (ADVIL,MOTRIN) 200 MG tablet Take 400 mg by mouth daily as needed for moderate pain. (Patient taking differently: Take 200-600 mg by mouth daily as needed for moderate pain (pain score 4-6) or mild pain (  pain score 1-3).)     lamoTRIgine  100 MG TBDP Take 2 tablets (200 mg total) by mouth daily in the afternoon.     lidocaine  (LIDODERM ) 5 % Apply 1 patch topically daily as needed (BACK PAIN).     lithium  carbonate (ESKALITH ) 450 MG ER tablet Take 1 tablet (450 mg total) by mouth at bedtime. 90 tablet 0   LORazepam  (ATIVAN ) 1 MG tablet Take 1 tablet (1 mg total) by mouth in the morning, at noon, in the evening, and at bedtime. 120 tablet 0   Multiple Vitamin (MULTI VITAMIN) TABS Take 0.5 tablets by mouth 2 (two) times daily.     testosterone  cypionate (DEPOTESTOSTERONE CYPIONATE) 200 MG/ML injection Inject 0.5 mLs (100 mg total) into the muscle once a week. 10 mL 5   Vitamin D , Ergocalciferol , (DRISDOL ) 1.25 MG (50000 UNIT) CAPS capsule Take 1 capsule (50,000 Units total) by mouth every 7 (seven) days. (Patient taking differently: Take 50,000 Units by mouth once a week. Take 50,000 units by mouth every week on Tuesday) 15 capsule 1   No current facility-administered medications for this visit.    Allergies as of 11/07/2023 - Review Complete 11/07/2023  Allergen Reaction Noted   Clozapine  Shortness Of Breath 11/07/2023   Trileptal [oxcarbazepine] Other (See Comments) 04/01/2019    Vitals: BP 126/87   Pulse 85   Ht 6' 2 (1.88 m)   Wt 238 lb (108 kg)   BMI 30.56 kg/m    Physical exam:  General: The patient is awake, alert and appears  in acute distress.  The patient is well groomed. Head: Normocephalic, atraumatic.   Neck is supple. Cardiovascular:  Regular rate and palpable peripheral pulse:  Respiratory: clear to auscultation.  Obese.   Neurologic exam : The patient is awake and alert, oriented to place and time.   Memory subjective  described as impaired.  There is limited  attention span & concentration ability.  Speech is fluent with dysarthria, dysphonia but I don't see true aphasia.  Mood and affect are agitated    Cranial nerves: Pupils are equal and briskly reactive to light.  Funduscopic exam evidence of pallor or edema.  Extraocular movements  in vertical and horizontal planes intact.  Visual fields by finger perimetry are intact. Hearing to finger rub intact .  Facial sensation intact to fine touch.  Facial motor strength is symmetric and tongue and uvula move midline. Tongue is restless   Motor exam:   Abnormal tone  in biceps  , symmetric muscle bulk and symmetric normal strength in all extremities. Grip Strength weaker in the right, had rotator-cuff surgery in April 2025.  Proximal strength of shoulder muscles and hip flexors was intact .  Sensory:   right leg sensory loss after of lower back pain, 2 micro-discectomies, fusion.   Fine touch and vibration were tested . Vibration felt in the left ankle for longer that on the right-   Proprioception was tested in the upper extremities only and was  normal.  Coordination: Rapid alternating movements in the fingers/hands were of  normal speed .  Finger-to-nose maneuver was tested and showed no evidence of dysmetria and endpoint  tremor.   Testing  with outstretched arms  shows bilateral hand tremor , mostly ring and small finger.   Gait and station: Patient walked with/ without assistive device .  Core Strength within normal limits.  Deep tendon reflexes: in the  upper and lower extremities are symmetric and  brisk without Clonus.  Assessment: Total time for face to face interview and examination, for review of  images and  laboratory testing, neurophysiology testing and pre-existing records, including out-of -network , was 45 minutes. Assessment is as follows here:   Medication induced dysarthria, dizziness, drooling.   0) I am not sure what I should follow up with, The patient a has presented to the Ed with Clozapine   induced This referral is for follow up on DYSARTHRIA and DYSARTHRIA has resolved. dysphagia, dysphonia and drooling, all resolved .  He has longer standing tardive dyskinetic movements, his psychiatric needs are followed by Dr. Geoffry.    NO stroke, no structural abnormality by CT in the ED - but I offered MRI brain to have a more detailed anatomical look to rule out vascular injuries.     1)   I offer a MRI brain, he is OK with this test. If no abnormality will not need to follow up .  This referral is for follow up on DYSARTHRIA and DYSARTHRIA has resolved.   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  3) tardive dyskinesia. Dr Geoffry.    Plan:    I recommend to defer for neurocognitive testing with a neuropsychologist, this referral can be send by PCP.  MRI brain to have a more detailed anatomical look to rule out vascular injuries.     Dedra Gores, MD  [facility] Board certified by The [employer] of Sleep Medicine and Diplomate of the [employer] of [employer] Medicine. Board certified In Neurology through the ABPN, Fellow of the [employer] of Neu[employer].

## 2023-11-07 NOTE — Patient Instructions (Addendum)
 MRI brain to rule out vascular  injury , brain stem lesion /   The patient a has presented to the Ed with Clozapine   induced This referral is for follow up on DYSARTHRIA and DYSARTHRIA has resolved. dysphagia, dysphonia and drooling, all resolved .   He has longer standing tardive dyskinetic movements, his psychiatric needs are followed by Dr. Geoffry.     NO stroke, no structural abnormality by CT in the ED - but I offered MRI brain to have a more detailed anatomical look to rule out vascular injuries.        1)   I offer a MRI brain, he is OK with this test. If no abnormality will not need to follow up .  This referral is for follow up on DYSARTHRIA and DYSARTHRIA has resolved.    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  3) tardive dyskinesia. Dr Geoffry.      Plan:    I recommend to defer for neurocognitive testing with a neuropsychologist, this referral can be send by PCP or psychiatry.   MRI brain to have a more detailed anatomical look to rule out vascular injuries.   Follow up if abnormal MRI.

## 2023-11-08 ENCOUNTER — Telehealth: Payer: Self-pay | Admitting: Neurology

## 2023-11-08 ENCOUNTER — Ambulatory Visit: Payer: Self-pay | Admitting: Neurology

## 2023-11-08 LAB — BASIC METABOLIC PANEL WITH GFR
BUN/Creatinine Ratio: 12 (ref 9–20)
BUN: 14 mg/dL (ref 6–24)
CO2: 24 mmol/L (ref 20–29)
Calcium: 9.7 mg/dL (ref 8.7–10.2)
Chloride: 98 mmol/L (ref 96–106)
Creatinine, Ser: 1.16 mg/dL (ref 0.76–1.27)
Glucose: 81 mg/dL (ref 70–99)
Potassium: 4.2 mmol/L (ref 3.5–5.2)
Sodium: 139 mmol/L (ref 134–144)
eGFR: 74 mL/min/1.73 (ref >59)

## 2023-11-08 NOTE — Telephone Encounter (Signed)
 no auth required sent to [facility] [phone]

## 2023-11-09 ENCOUNTER — Other Ambulatory Visit (HOSPITAL_COMMUNITY): Payer: Self-pay

## 2023-11-10 ENCOUNTER — Telehealth (INDEPENDENT_AMBULATORY_CARE_PROVIDER_SITE_OTHER): Admitting: Psychiatry

## 2023-11-10 ENCOUNTER — Encounter: Payer: Self-pay | Admitting: Psychiatry

## 2023-11-10 ENCOUNTER — Ambulatory Visit (HOSPITAL_COMMUNITY): Admission: RE | Admit: 2023-11-10 | Source: Ambulatory Visit

## 2023-11-10 DIAGNOSIS — R5382 Chronic fatigue, unspecified: Secondary | ICD-10-CM

## 2023-11-10 DIAGNOSIS — G4733 Obstructive sleep apnea (adult) (pediatric): Secondary | ICD-10-CM

## 2023-11-10 DIAGNOSIS — F900 Attention-deficit hyperactivity disorder, predominantly inattentive type: Secondary | ICD-10-CM | POA: Diagnosis not present

## 2023-11-10 DIAGNOSIS — F314 Bipolar disorder, current episode depressed, severe, without psychotic features: Secondary | ICD-10-CM

## 2023-11-10 DIAGNOSIS — F411 Generalized anxiety disorder: Secondary | ICD-10-CM | POA: Diagnosis not present

## 2023-11-10 DIAGNOSIS — F5105 Insomnia due to other mental disorder: Secondary | ICD-10-CM | POA: Diagnosis not present

## 2023-11-10 DIAGNOSIS — G2581 Restless legs syndrome: Secondary | ICD-10-CM

## 2023-11-10 NOTE — Progress Notes (Signed)
 Shawn Cervantes [id number] 01/10/1971 53 y.o.  Video Visit via My Chart  I connected with pt by video using My Chart and verified that I am speaking with the correct person using two identifiers.   I discussed the limitations, risks, security and privacy concerns of performing an evaluation and management service by My Chart  and the availability of in person appointments. I also discussed with the patient that there may be a patient responsible charge related to this service. The patient expressed understanding and agreed to proceed.  I discussed the assessment and treatment plan with the patient. The patient was provided an opportunity to ask questions and all were answered. The patient agreed with the plan and demonstrated an understanding of the instructions.   The patient was advised to call back or seek an in-person evaluation if the symptoms worsen or if the condition fails to improve as anticipated.  I provided 30 minutes of video time during this encounter.  The patient was located at home and the provider was located office. Session [id number]  Tried to connect video but ended up only phone bc pt 's desires  Subjective:   [name] is a 53 y.o. (DOB 04/22/1970) male.  Chief Complaint:  Chief Complaint  Patient presents with   Follow-up   Depression   Anxiety   Stress    HPI Ricard T Dupuy presents to the office today for follow-up of bipolar depression and panic disorder and ADD.  On 12/24/21 started Trintellix  and up to 10 mg daily.  And changed Adderall to concerta  54 mg Am No sig effect from Concerta  54. Maybe a little loss of energy with switch. NoSE with it. NO SE with trintellix  change. No change in energy.   F in law died.   Maybe more depressed than he's gotten used to being.  Hard to make himself go out.  Can't fake his way through it right now. Anxiety settled after adjusted to Trintellix  but still more.  Anxiety about the same as when on Paxil.   Sleep pattern is same but if anything more sleep than normal.  But some days EFA.  Harder to wake in the morning.   Mirtazapine  plus Quiviq helps initial insomnia.  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 Increase Trintellix  to 20 mg daily for treatment resistant bipolar depression  03/18/22 appt noted: Sleep better off Xtampza  and on morphine  ER 15 BID. No benefit or SE with Trintellix  20 mg daily. Anxiety is ok but not great.  Not the thing that's keeping me from moving DT depression and fatigue. Chronic apathy so why bother doing anything.  Will get anxious thinking about the downward spiral. Concerta  72 mg no better than Adderall.  Doesn't take it daily bc doesn't seem to help.  Stopped it for a couple of weeks.  Did take it consistently at 54 mg .  Didn't notice much from the 72 mg being different. Thinks he originally got benefit from Adderall. Has decided the pain meds make him tired and apathetic and wants to try to get off pain meds.   Sleep with Ambien  occ. Plan: Concerta  without help so return to Adderall.   Increase Trintellix  to 30 mg daily for treatment resistant bipolar depression for 2 weeks.  If NR stop it.  This is off label. Continue mirtazapine  30 mg HS.  OK prn zolpidem   04/08/22 appt noted: Overused the Ambien  and out.     Took 30 mg Trintellix  for a week or so and stopped it DT NR. Stopped pain pills for a couple of week and used Kratom to help with withdrawal.  Went to doctor yesterday and he got immediate release morphine  15 mg and got #120 pills. Withdrawal was better within a week or so.  Current meds:  Adderall XR 20 daily, flexeril  10 mg HS, Quiviviq, Depakote  ER 1000 mg HS, lamotrigine  100 mg daily, lithium  Cr 450 HS, mirtazapine  30 mg HS, stopped Trintellix , out of Ambien . Better overall mood since off the opiate.  Adderall seems more effective off the  opiate.  Would like to go back up on the amount to 30 XR and 15 IR daily. Beth says he's been up out of bed more than he was and applied for some jobs  so she notices some benefit. Last couple of weeks anxiety is pretty good.   Plan: Concerta  without help so return to Adderall.   DC Trintellix  Agrees to retry Latuda  but go really low to start bc got agitated on it before. Continue mirtazapine  30 mg HS.   DC Ambien  and no further RX for it.  05/18/2022 appointment noted: About 2 to 3 weeks ago stopped Latuda  20 mg because he felt agitated. Wife reports she is not doing well with regard to depression. 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  W doesn't think his thinking realistic.  He's been in bed for a week off Latuda . Got scammed in job situation.  Haven't felt as bad as this in a long time.  Feels like a disappointment.  Plan Amantadine  100 mg HS for a week then 100 mg BID Concerta  without help so return to Adderall.   DC  mirtazapine  30 mg HS Seroquel  25-50 mg HS   DC Ambien  and no further RX for it.  06/17/22 appt noted: Taking amantadine  twice daily.  Didn't really notice anything as far as benefit. Anxiety has been up more on fire inside and feels afraid like something bad is about to happen.  Worse 3 days last week and 3 days this week crippling at times and usually mid afternoon.   Still too sleepy and tired to do anything and this causes anxiety.  Energy better briefly but down again.   Sleep better with Seroquel  at nnight and more benefit.  But trouble waking up More desire to be engaged with people off all opiates.   Plan: DC Amantadine  100 Start Vraylar 1.5 mg QOD  Adderall.   Seroquel  25-50 mg HS   DC Ambien  and no further RX for it. Increase Depakote  ER 1500 mg HS  07/22/22 appt noted: No opiate for almost 2 mos.  Energy is better and once awake can stay awake. Pain is roughly the same but more consistent than when on opiates.  And can have spikes. Kratom helps without  SE and occ delta 8 on separate occ and it helps pain. No crazy buzz like THC. Meds: Vraylar 1.5 mg daily, Vit D 50 K week, Seroquel  50 mg HS, Depakote  Er 1500 HS, lamotrigine  100 BID , lithium  ER 450 HS, Adderall XR 30 AM and IR 10 mg PM Started testosterone  which might help mood and energy too.   More consistent.   A little less dep and a little better energy.  Some hangover with Seroquel  and hard to get up 7 AM.  Sleep much better with  Seroquel .  Sometimes sleeps through night.  No other SE.   No akathisia. Not much anxiety at all lately.   Dep 4/10.  Definitely best in a long time.    10/07/22 appt noted: CPAP causes anxiety dealing with it using gabapentin . Sleep 5-6 hours with occ of more.   Meds as above  gabapentin  as noted. For the most part ok.  A lot of trouble getting up in the AM chronically but worse the last couple of weeks.  Good bit of fatigue gradually better.   Adderall doesn't wake him up.   Managing to do a few things during the day now.  Takes 1 and 1/2 hour to go to sleep chronically.  To sleep about 1130.  Awakens 2-3 times per night and night eat and then gets up late about noon.  In bed 12 hours daily.  Will sleep solid 7-10 AM regularly.   Always hard to wake up but may be worse with Seroquel .   Been stressing some and a little low side with dep.  Still has some panic.  Situational usually.   SE mild akathisia  12/30/22 appt noted: Not needing Seroquel  usually for sleep.  Gabapentin  now helping that and anxiety at 600-900 mg daily and helps pain some .  Some hangover but less than Seroquel . Most of time more alert with Adderall and Sunosi  together.  However some days still drags.  No clear reason.  Last night slept well but still dragging today and then just wants to close his eyes.  He's having trouble functioning on days like this.   No SE  with it.   Mood up and down but more up than down since here.  When down is more isolated and withdrawn but usually brief.   More normal days with current med regimen.  Fatigue will cause him to get down. Sept is usually down month.  Was better with this this year.  Wife has noted his upswings in mood.  Can be a bit impulsive with upswings but not severe.  Can return things if needed.   10 yr ago back injury and can't do what he did back then.   SE some hangover with gabapentin  seems unrelated to amount he takes.  Positional tremor with VPA.   Started injx testosterone  for a month. Doing as well as the gel.  Libido is better.  Maybe recover better from physical activity.   Plan: DC Vraylar 1.5 mg QD bc ? Benefit and polypharmacy.    02/03/23 appt noted: Psych meds: stopped Vraylar.  Adderall XR 30 & IR 15 mg PM, Sunosi  150, Depakote  ER 1500 HS, gabapentin  300 TID, lamotrigine  100 BID, Eskalith  CR 450 HS.    Not taking Seroquel  for sleep.  SE hangover Stopped Vraylar and more dep and irritable, increased fear feeling on sense of doom.  Mood definitely worse off the Vraylar.   Situational stress.  Still don't have a job.  A lot of trouble carrying through on things.  Fatigue with dep.  Napping a couple of times per day.  Poor function making marriage worse.      06/16/23 appt noted:  with wife Psych meds: Seroquel  100 BID, clonidine  0.1 BID, Vraylar 1.5, no lithium  until last night. No SE. Anxious worried and down. Clonidine  helped little with anxiety. He has been very agitated since being off his main mood stabilizing medications Depakote  and lamotrigine .  He is having a great deal of anxiety.  He has trouble sitting still.  He has trouble sleeping.  He is motivated to do anything that may be helpful.  He is not currently suicidal. See the attached notes for details around the events since November 2024.  He has had a course of ECT at [facility].  However he had severe agitation and confusion coming out of anesthesia.  It was so severe that he was somewhat violent for brief periods of time while he was confused.  He is  not seen to gain significant benefits from the ECT.  [facility] is reconsidering whether to continue it but it appears that will be discontinued. He is having shoulder surgery next Tuesday. Plan: Cont Adderall XR 30 AM and IR 15 mg PM   Continue Seroquel  100 mg twice daily Resume Depakote  ER 1500 mg HS.   Continue lithium  CR 450 Mg HS Vraylar 1.5 mg   07/05/23 Disc case with wife:   Currently Lithium  Depakote  1000 mg HS Clonidine  0.1 mg BID and 0.1 mg twice daily prn Clozapine  50 mg HS Ativan  2 mg BID.  Plan: increase clozapine  100 mg HS and then to 150 mg HS asap.  It will not help at lower dose than that. Spread out Ativan  1 mg QID. Increase clonidine  to 0.2 mg BID and even 0.3 mg BID if BP and anxiety are still high.  Lorene Macintosh, MD, DFAPA  08/18/23 appt noted:  Psych meds: Adderall XR 30 every morning with Adderall 15 mg in the afternoon, Sunosi  150 am,  Vraylar 1.5 mg daily, no clonidine  0.1 mg daily, Depakote  ER 1500 nightly, gabapentin  300 mg 3 times daily as needed pain, lithium  ER 450 nightly, reduced Ativan  1 mg 2-3 times daily.   Seroquel  irregularly 100 HS. Stopped clozapine . 2 ER visits within 10 days complaining of SE clozapine  trouble swallowing.  Stopped clonidine .  He thinks it made NM worse; read about SE.  Had NM of how he would die.   Still having nightly NM; tends to be when upset of any kind, dep or angry.  Now over was sure he was going to die: when he was having trouble swallowing.  Was afraid to go to sleep for a couple of weeks after stopping clozapine .    Gabapentin  does calm him down.   Ongoing STM px some are worse since ECT. Ongoing anxiety including daytime anxiety.  Worrying over can't do things bc shoulder surgery. Trouble sleeping for a couple of days then crashes.  Fear is keeping him from sleeping.  Doesn't think it is entirely conscious but fears NM.  Then awakens panicked.  Afraid to sleep gradually.  Afraid to use CPAP bc suffocation fears.  Was  using CPAP more consistently in the early days of use.   Wife sees him as scattered.  Not finishing things.   Getting more dep and unstable emotionally, tearful over anything negative.  Irritable.  Plan: Stop Sunosi  Add Vyvanse  50 in the morning with Adderall XR. Increase gabapentin  for anxiety and sleep to 600-900 mg twice daily and 1200 mg at night.   Stop Vraylar and switch to Rexulti 1 mg in the morning. Try nasal pillows mask bc claustrophobia.  09/08/23 appt noted:  Psych meds: Vyvanse  50 AM with Adderall XR mg AM, stopped Sunosi  150 am,  continued Vraylar 1.5, no Rexulti,   Depakote  ER 1500 nightly, gabapentin  300 mg 1200 mg PM and 600 mg 2 times daily as needed pain, lithium  ER 450 nightly, reduced Ativan  1 mg 2-3 times daily.   Seroquel  irregularly  100 HS. No effect noticed with med changes.  Still exhausted and sleepy and hard to wake up.  No benefit Vyvanse .  Spending most of the day in bed. He No sig akathisia.   CC Anxiety is probably worse than last time.  Couple occ unable to leave house bc so anxious.  Anxiety worse makes him more dep.  Sleep better with Ativan  and Seroquel .  Hangover with it.  Wife says sleeping a lot. Feels frightened about sleep and fear choking but not really having the px he had before on clozapine .   SE hangover ? NM Reviewed prior meds.   Normal BP in June. ? Was elevated on Sunosi  and Adderall.  Plan: DC Vyvanse  DT NR Return to Adderall XR 30 AM and IR 15 mg PM   Depakote  ER 1500 mg HS.   Ok resume lamotirigine 50 mg for 2 weeks then 100 mg for dep.  Took it before. Continue Vraylar for now 1.5  Continue lithium  CR 450 Mg HS Increased gabapentin  for RLS, anxiety, sleep.  1200 mg pm and 600-900 mg BID. Trial sertraline  100 for TR anxiety Stopped Ambien  and no further RX for it. Seroquel  as need for sleep 50-100 mg HS  10/06/23 appt noted: virtual Med: Adderall XR 30 mg every morning, Vraylar 1.5 daily, Depakote  ER 1500 nightly, gabapentin  900 mg  twice daily and 1200 nightly, lamotrigine  100, lithium  ER 450, quetiapine  50 nightly, sertraline  100. Ativan  1 mg TID-QID Energy level plumetted again.  Want to sleep .   Apathy and lack of motivation.   Get wound up easily.  Hard to describe.  The more wound up the less likely he is to do anything.  No sig crying.   No real change with switch back to Adderall.   Maybe gabapentin  increase made him more tired.  No effect from sertraline  noticeable.  Sleep with Seroquel  change.   Has gone to store some and did go swimming but has to force himself. Prmary worry not having a job.   Dep 7-8/10.  Maybe a little worse.   Asks for lorazepam  for sleep and excessive worry or social intolerance/anxiety.   No SE other than sleepiness or tiredness.   Pain about 4/10.   Just wants to sleep to escape.  Plan: Reduce gabapentin  to 300 mg 1 twice daily and 4 at night to reduce fatigue Increase sertraline  to 150 mg daily.   Increase lamotrigine  to 200 mg daily. Get labs: vitamin D , Ferritin, TSH,  Contact Helayne Skinner for counseling  11/10/23 appt noted: emergency appt. Med  stopping zoloft , stopped Seroquel  DT tremor which resolved.   Was planning to stop Vraylar but continue Depakote  and Ativan  and lamotrigine  and lithium . Cycles of sleeping not enough then too much. Very tired and doesn't want to talk.  Agreed to brief update and conversation about sx and plan. Did agree to go to new therapist and start IOP.   No acute SI.   Past Psychiatric Medication Trials: Depakote  1500 Oxcarbazepine hyponatremia Lamotrigine  100 mg twice daily Lithium  900 tremor  Saphris 20 sed Seroquel  600 sedation Olanzapine Vraylar 3 mg no response and some akathisia Rexulti ? effect Caplyta restlessness Latuda  questionable dose, brief and possibly agitated, 20 mg retry agitated & angry Clozapine  SE at low dose  trouble swallowing  Adderall 45, modafinil 300 NR, Concerta  72 mg AM poor response, Vyvanse   50  Pramipexole no response and restless Amantadine  100 BID NR  Paroxetine 40 sleepy, fluoxetine, recent brief  Zoloft   not helpful for anxiety nortriptyline anxiety,  Wellbutrin no response,  Auvelity no response Trintellix  30 for a week, NR  Gabapentin  4000 mg no response for pain.  Benefit anxiety and sleep Lyrica SE Clonazepam  0.5 mg twice daily, alprazolam sedation Clonidine  0.2 NM worse Trazodone no response Ambien  and Ambien  CR abuse,  hydroxyzine, Lunesta, mirtazapine  30,  Thorazine 25 side effects of night eating, Seroquel  50 for sleep Quiviq Lyrica  ECT 2025 unsuccessful with difficulty from anesthesia specifically agitated reaction  Therapist Prentice May EMDR  Psychiatric hospitalization in 1997  Flowsheet Row ED from 06/29/2023 in [facility] Emergency Department at [facility] Admission (Discharged) from 06/20/2023 in [place] [facility] PERIOPERATIVE ARE[facility] UC from 10/31/2021 in [facility] RISK CATEGORY No Risk No Risk No Risk     Review of Systems:  Review of Systems  Constitutional:  Positive for fatigue.  Musculoskeletal:  Positive for arthralgias and back pain.  Neurological:  Positive for tremors.  Psychiatric/Behavioral:  Positive for agitation, decreased concentration, dysphoric mood and sleep disturbance. The patient is nervous/anxious.     Medications: I have reviewed the patient's current medications.  Current Outpatient Medications  Medication Sig Dispense Refill   acetaminophen  (TYLENOL ) 325 MG tablet Take 650 mg by mouth 2 (two) times daily as needed for moderate pain.     albuterol (VENTOLIN HFA) 108 (90 Base) MCG/ACT inhaler Inhale 2 puffs into the lungs every 4 (four) hours as needed for wheezing or shortness of breath.     amphetamine -dextroamphetamine  (ADDERALL XR) 30 MG 24 hr capsule Take 1 capsule (30 mg total) by mouth daily. 30 capsule 0   cariprazine (VRAYLAR) 1.5 MG capsule Take 1 capsule by mouth daily.      divalproex  (DEPAKOTE  ER) 500 MG 24 hr tablet Take 3 tablets (1,500 mg total) by mouth at bedtime. 270 tablet 0   ibuprofen (ADVIL,MOTRIN) 200 MG tablet Take 400 mg by mouth daily as needed for moderate pain. (Patient taking differently: Take 200-600 mg by mouth daily as needed for moderate pain (pain score 4-6) or mild pain (pain score 1-3).)     lamoTRIgine  100 MG TBDP Take 2 tablets (200 mg total) by mouth daily in the afternoon.     lidocaine  (LIDODERM ) 5 % Apply 1 patch topically daily as needed (BACK PAIN).     lithium  carbonate (ESKALITH ) 450 MG ER tablet Take 1 tablet (450 mg total) by mouth at bedtime. 90 tablet 0   LORazepam  (ATIVAN ) 1 MG tablet Take 1 tablet (1 mg total) by mouth in the morning, at noon, in the evening, and at bedtime. 120 tablet 0   Multiple Vitamin (MULTI VITAMIN) TABS Take 0.5 tablets by mouth 2 (two) times daily.     testosterone  cypionate (DEPOTESTOSTERONE CYPIONATE) 200 MG/ML injection Inject 0.5 mLs (100 mg total) into the muscle once a week. 10 mL 5   Vitamin D , Ergocalciferol , (DRISDOL ) 1.25 MG (50000 UNIT) CAPS capsule Take 1 capsule (50,000 Units total) by mouth every 7 (seven) days. (Patient taking differently: Take 50,000 Units by mouth once a week. Take 50,000 units by mouth every week on Tuesday) 15 capsule 1   gabapentin  (NEURONTIN ) 300 MG capsule Take 3 capsules (900 mg total) by mouth 2 (two) times daily AND 4 capsules (1,200 mg total) every evening. 300 capsule 1   No current facility-administered medications for this visit.    Medication Side Effects: None  Allergies:  Allergies  Allergen Reactions   Clozapine  Shortness Of  Breath    Difficulty with swallowing/ and breathing   Trileptal [Oxcarbazepine] Other (See Comments)    Low sodium    Past Medical History:  Diagnosis Date   ADHD (attention deficit hyperactivity disorder)    Anxiety    Arthritis    knee and back   Bipolar 1 disorder (HCC)    Complication of anesthesia     Constipation    Depression    bi polar   GERD (gastroesophageal reflux disease)    Head injury    as a child ;had stitches in head   Headache    migraines   Hypertension    Mental disorder    Bipolar   PONV (postoperative nausea and vomiting)    Sleep apnea    wears Cpap   Testosterone  deficiency    Trigeminal neuralgia of left side of face 04/01/2019   Left V2    Past Medical History, Surgical history, Social history, and Family history were reviewed and updated as appropriate.   Please see review of systems for further details on the patient's review from today.   Objective:   Physical Exam:  There were no vitals taken for this visit.  Physical Exam Neurological:     Mental Status: He is alert and oriented to person, place, and time.     Cranial Nerves: No dysarthria.  Psychiatric:        Attention and Perception: Attention and perception normal.        Mood and Affect: Mood is anxious and depressed. Affect is not blunt or tearful.        Speech: Speech normal.        Behavior: Behavior is not agitated or aggressive. Behavior is cooperative.        Thought Content: Thought content normal. Thought content is not paranoid or delusional. Thought content does not include homicidal or suicidal ideation. Thought content does not include suicidal plan.        Cognition and Memory: Cognition and memory normal.        Judgment: Judgment normal.     Comments: Insight fair Some irritablity and doesn't want to talk. Ongoing dep and anxious.      Lab Review:     Component Value Date/Time   NA 139 11/07/2023 1513   K 4.2 11/07/2023 1513   CL 98 11/07/2023 1513   CO2 24 11/07/2023 1513   GLUCOSE 81 11/07/2023 1513   GLUCOSE 121 (H) 07/30/2023 0145   BUN 14 11/07/2023 1513   CREATININE 1.16 11/07/2023 1513   CALCIUM 9.7 11/07/2023 1513   PROT 6.9 07/30/2023 0141   PROT 6.9 09/22/2020 0927   ALBUMIN  4.2 07/30/2023 0141   ALBUMIN  4.5 09/22/2020 0927   AST 26 07/30/2023  0141   ALT 22 07/30/2023 0141   ALKPHOS 42 07/30/2023 0141   BILITOT 1.2 07/30/2023 0141   BILITOT <0.2 09/22/2020 0927   GFRNONAA >60 07/30/2023 0141   GFRAA >60 08/02/2016 1416       Component Value Date/Time   WBC 8.3 07/30/2023 0141   RBC 5.08 07/30/2023 0141   HGB 15.3 07/30/2023 0145   HGB 15.2 07/26/2023 1531   HCT 45.0 07/30/2023 0145   HCT 45.0 07/26/2023 1531   PLT 228 07/30/2023 0141   PLT 244 07/26/2023 1531   MCV 87.8 07/30/2023 0141   MCV 90 07/26/2023 1531   MCH 30.9 07/30/2023 0141   MCHC 35.2 07/30/2023 0141   RDW 12.8 07/30/2023 0141   RDW 13.1  07/26/2023 1531   LYMPHSABS 2.7 07/30/2023 0141   LYMPHSABS 2.3 07/26/2023 1531   MONOABS 0.6 07/30/2023 0141   EOSABS 0.4 07/30/2023 0141   EOSABS 0.4 07/26/2023 1531   BASOSABS 0.1 07/30/2023 0141   BASOSABS 0.1 07/26/2023 1531    Lithium  Lvl  Date Value Ref Range Status  05/25/2021 0.5 0.5 - 1.2 mmol/L Final    Comment:    A concentration of 0.5-0.8 mmol/L is advised for long-term use; concentrations of up to 1.2 mmol/L may be necessary during acute treatment.                                  Detection Limit = 0.1                           <0.1 indicates None Detected      Lab Results  Component Value Date   VALPROATE 51 05/30/2019     .res Assessment: Plan:    Kaliel was seen today for follow-up, depression, anxiety and stress.  Diagnoses and all orders for this visit:  Bipolar disorder with severe depression (HCC)  Generalized anxiety disorder  Chronic fatigue  Obstructive sleep apnea hypopnea, severe  Insomnia due to mental condition  Attention deficit hyperactivity disorder (ADHD), predominantly inattentive type  Restless legs syndrome     . We discussed his treatment resistant bipolar depression with severe anxiety and fatigue.  Multiple medication failures including the last interventions.   Likely elements of PTSD complicating treatment.  Rec counseling .  Likely would  benefit more with EMDR.  Consider Helayne Skinner.    No opiates for several mos.  Better energy initially   OSA severe on CPAP as of early June 2024.  Try nasal pillows mask.  option Retry pramipexole (highest was 1.5 mg daily) but hx restlessness with it. Less often NM Consider resuming lamotrigine  for depression.  Disc unfortunately necessary to use polypharmacy.  Disc risk mania with sertraline  but anxiety has been unmanageable.  Adderall XR 30 AM and IR 15 mg PM    Depakote  ER 1500 mg HS.    increase lamotirigine 100 mg BID for dep.  Took it before.  Continue Vraylar for now 1.5    Continue lithium  CR 450 Mg HS  Increased gabapentin  for RLS, anxiety, sleep.  1200 mg pm and 600-900 mg BID.  Stopped Ambien  and no further RX for it.  He stopped DT tremor Seroquel  as need for sleep 50-100 mg HS  Discussed potential benefits, risks, and side effects of stimulants with patient to include increased heart rate, palpitations, insomnia, increased anxiety, increased irritability, or decreased appetite.  Instructed patient to contact office if experiencing any significant tolerability issues.  Discussed potential metabolic side effects associated with atypical antipsychotics, as well as potential risk for movement side effects. Advised pt to contact office if movement side effects occur.    Disc SE in detail and SSRI withdrawal sx.  Rec transfer psych care at [facility] or [facility] for TR bipolar depression. Considered hosp but decided for IOP per his preference and willingness to go.  Sent following in mychart:   Continue Vraylar. Reduce sertraline  to 50 mg for a week before you stop to try to prevent withdrawal dizziness.  Let me know if it's a problem. Continue Depakote  and take an extra as needed for agitation.  Continue other meds until sees new doctor.  Contact Helayne Skinner for therapy Start IOP at either [facility] or [facility] psychiatry to establish care their quickly so you can get some  relief quicker than waiting for a standard routine outpatient appt.  Let me know which you chose so I can get them records of what you have taken.  Lorene Macintosh MD, DFAPA  Please see After Visit Summary for patient specific instructions.  Follow-up 1 mos  Lorene Macintosh, MD, DFAPA   Future Appointments  Date Time Provider Department Center  11/10/2023  4:30 PM [facility] [place]       No orders of the defined types were placed in this encounter.   -------------------------------bi

## 2023-11-16 DIAGNOSIS — F314 Bipolar disorder, current episode depressed, severe, without psychotic features: Secondary | ICD-10-CM | POA: Diagnosis not present

## 2023-11-17 ENCOUNTER — Other Ambulatory Visit (HOSPITAL_COMMUNITY): Payer: Self-pay

## 2023-11-17 DIAGNOSIS — F314 Bipolar disorder, current episode depressed, severe, without psychotic features: Secondary | ICD-10-CM | POA: Diagnosis not present

## 2023-11-18 ENCOUNTER — Ambulatory Visit (HOSPITAL_COMMUNITY)
Admission: RE | Admit: 2023-11-18 | Discharge: 2023-11-18 | Disposition: A | Discharge status: Home / Self Care | Source: Ambulatory Visit | Attending: Neurology | Admitting: Neurology

## 2023-11-18 ENCOUNTER — Encounter (HOSPITAL_COMMUNITY): Payer: Self-pay

## 2023-11-18 DIAGNOSIS — R9089 Other abnormal findings on diagnostic imaging of central nervous system: Secondary | ICD-10-CM | POA: Diagnosis not present

## 2023-11-18 DIAGNOSIS — F339 Major depressive disorder, recurrent, unspecified: Secondary | ICD-10-CM | POA: Diagnosis not present

## 2023-11-18 DIAGNOSIS — R4781 Slurred speech: Secondary | ICD-10-CM | POA: Insufficient documentation

## 2023-11-18 DIAGNOSIS — G2401 Drug induced subacute dyskinesia: Secondary | ICD-10-CM | POA: Insufficient documentation

## 2023-11-18 DIAGNOSIS — F332 Major depressive disorder, recurrent severe without psychotic features: Secondary | ICD-10-CM | POA: Insufficient documentation

## 2023-11-18 MED ORDER — GADOBUTROL 1 MMOL/ML IV SOLN
10.0000 mL | Freq: Once | INTRAVENOUS | Status: AC | PRN
  Administered 2023-11-18: 10 mL via INTRAVENOUS

## 2023-11-20 DIAGNOSIS — F314 Bipolar disorder, current episode depressed, severe, without psychotic features: Secondary | ICD-10-CM | POA: Diagnosis not present

## 2023-11-21 DIAGNOSIS — F314 Bipolar disorder, current episode depressed, severe, without psychotic features: Secondary | ICD-10-CM | POA: Diagnosis not present

## 2023-11-22 DIAGNOSIS — F314 Bipolar disorder, current episode depressed, severe, without psychotic features: Secondary | ICD-10-CM | POA: Diagnosis not present

## 2023-11-23 DIAGNOSIS — F314 Bipolar disorder, current episode depressed, severe, without psychotic features: Secondary | ICD-10-CM | POA: Diagnosis not present

## 2023-11-24 DIAGNOSIS — F314 Bipolar disorder, current episode depressed, severe, without psychotic features: Secondary | ICD-10-CM | POA: Diagnosis not present

## 2023-11-28 ENCOUNTER — Other Ambulatory Visit (HOSPITAL_COMMUNITY): Payer: Self-pay

## 2023-11-28 DIAGNOSIS — F314 Bipolar disorder, current episode depressed, severe, without psychotic features: Secondary | ICD-10-CM | POA: Diagnosis not present

## 2023-11-28 MED ORDER — ZOLPIDEM TARTRATE 5 MG PO TABS
5.0000 mg | ORAL_TABLET | Freq: Every evening | ORAL | 0 refills | Status: AC | PRN
  Filled 2023-11-28: qty 15, 30d supply, fill #0

## 2023-11-29 DIAGNOSIS — F314 Bipolar disorder, current episode depressed, severe, without psychotic features: Secondary | ICD-10-CM | POA: Diagnosis not present

## 2023-12-01 DIAGNOSIS — F314 Bipolar disorder, current episode depressed, severe, without psychotic features: Secondary | ICD-10-CM | POA: Diagnosis not present

## 2023-12-04 DIAGNOSIS — F314 Bipolar disorder, current episode depressed, severe, without psychotic features: Secondary | ICD-10-CM | POA: Diagnosis not present

## 2023-12-05 DIAGNOSIS — F314 Bipolar disorder, current episode depressed, severe, without psychotic features: Secondary | ICD-10-CM | POA: Diagnosis not present

## 2023-12-06 DIAGNOSIS — F314 Bipolar disorder, current episode depressed, severe, without psychotic features: Secondary | ICD-10-CM | POA: Diagnosis not present

## 2023-12-07 DIAGNOSIS — F314 Bipolar disorder, current episode depressed, severe, without psychotic features: Secondary | ICD-10-CM | POA: Diagnosis not present

## 2023-12-08 DIAGNOSIS — F314 Bipolar disorder, current episode depressed, severe, without psychotic features: Secondary | ICD-10-CM | POA: Diagnosis not present

## 2023-12-11 DIAGNOSIS — F314 Bipolar disorder, current episode depressed, severe, without psychotic features: Secondary | ICD-10-CM | POA: Diagnosis not present

## 2023-12-12 DIAGNOSIS — F314 Bipolar disorder, current episode depressed, severe, without psychotic features: Secondary | ICD-10-CM | POA: Diagnosis not present

## 2023-12-13 DIAGNOSIS — F314 Bipolar disorder, current episode depressed, severe, without psychotic features: Secondary | ICD-10-CM | POA: Diagnosis not present

## 2023-12-13 DIAGNOSIS — G4733 Obstructive sleep apnea (adult) (pediatric): Secondary | ICD-10-CM | POA: Diagnosis not present

## 2023-12-14 DIAGNOSIS — F314 Bipolar disorder, current episode depressed, severe, without psychotic features: Secondary | ICD-10-CM | POA: Diagnosis not present

## 2023-12-15 DIAGNOSIS — F314 Bipolar disorder, current episode depressed, severe, without psychotic features: Secondary | ICD-10-CM | POA: Diagnosis not present

## 2023-12-19 ENCOUNTER — Other Ambulatory Visit (HOSPITAL_COMMUNITY): Payer: Self-pay

## 2023-12-19 DIAGNOSIS — F314 Bipolar disorder, current episode depressed, severe, without psychotic features: Secondary | ICD-10-CM | POA: Diagnosis not present

## 2023-12-19 MED ORDER — ZOLPIDEM TARTRATE 5 MG PO TABS
5.0000 mg | ORAL_TABLET | Freq: Every evening | ORAL | 0 refills | Status: AC | PRN
  Filled 2023-12-29: qty 30, 30d supply, fill #0

## 2023-12-20 ENCOUNTER — Other Ambulatory Visit (HOSPITAL_COMMUNITY): Payer: Self-pay

## 2023-12-20 ENCOUNTER — Other Ambulatory Visit: Payer: Self-pay | Admitting: Psychiatry

## 2023-12-20 DIAGNOSIS — F900 Attention-deficit hyperactivity disorder, predominantly inattentive type: Secondary | ICD-10-CM

## 2023-12-20 DIAGNOSIS — F314 Bipolar disorder, current episode depressed, severe, without psychotic features: Secondary | ICD-10-CM | POA: Diagnosis not present

## 2023-12-20 MED ORDER — AMPHETAMINE-DEXTROAMPHET ER 30 MG PO CP24
30.0000 mg | ORAL_CAPSULE | Freq: Every day | ORAL | 0 refills | Status: AC
Start: 2023-12-20 — End: ?
  Filled 2023-12-20: qty 30, 30d supply, fill #0

## 2023-12-21 DIAGNOSIS — F314 Bipolar disorder, current episode depressed, severe, without psychotic features: Secondary | ICD-10-CM | POA: Diagnosis not present

## 2023-12-22 DIAGNOSIS — M545 Low back pain, unspecified: Secondary | ICD-10-CM | POA: Diagnosis not present

## 2023-12-22 DIAGNOSIS — Z1211 Encounter for screening for malignant neoplasm of colon: Secondary | ICD-10-CM | POA: Diagnosis not present

## 2023-12-22 DIAGNOSIS — G43909 Migraine, unspecified, not intractable, without status migrainosus: Secondary | ICD-10-CM | POA: Diagnosis not present

## 2023-12-22 DIAGNOSIS — Z125 Encounter for screening for malignant neoplasm of prostate: Secondary | ICD-10-CM | POA: Diagnosis not present

## 2023-12-22 DIAGNOSIS — G4721 Circadian rhythm sleep disorder, delayed sleep phase type: Secondary | ICD-10-CM | POA: Diagnosis not present

## 2023-12-22 DIAGNOSIS — E291 Testicular hypofunction: Secondary | ICD-10-CM | POA: Diagnosis not present

## 2023-12-22 DIAGNOSIS — K219 Gastro-esophageal reflux disease without esophagitis: Secondary | ICD-10-CM | POA: Diagnosis not present

## 2023-12-22 DIAGNOSIS — F319 Bipolar disorder, unspecified: Secondary | ICD-10-CM | POA: Diagnosis not present

## 2023-12-22 DIAGNOSIS — E782 Mixed hyperlipidemia: Secondary | ICD-10-CM | POA: Diagnosis not present

## 2023-12-22 DIAGNOSIS — Z0000 Encounter for general adult medical examination without abnormal findings: Secondary | ICD-10-CM | POA: Diagnosis not present

## 2023-12-22 DIAGNOSIS — F988 Other specified behavioral and emotional disorders with onset usually occurring in childhood and adolescence: Secondary | ICD-10-CM | POA: Diagnosis not present

## 2023-12-25 DIAGNOSIS — F314 Bipolar disorder, current episode depressed, severe, without psychotic features: Secondary | ICD-10-CM | POA: Diagnosis not present

## 2023-12-26 DIAGNOSIS — F314 Bipolar disorder, current episode depressed, severe, without psychotic features: Secondary | ICD-10-CM | POA: Diagnosis not present

## 2023-12-27 DIAGNOSIS — F314 Bipolar disorder, current episode depressed, severe, without psychotic features: Secondary | ICD-10-CM | POA: Diagnosis not present

## 2023-12-27 DIAGNOSIS — F19982 Other psychoactive substance use, unspecified with psychoactive substance-induced sleep disorder: Secondary | ICD-10-CM | POA: Diagnosis not present

## 2023-12-27 DIAGNOSIS — Z9152 Personal history of nonsuicidal self-harm: Secondary | ICD-10-CM | POA: Diagnosis not present

## 2023-12-27 DIAGNOSIS — G2581 Restless legs syndrome: Secondary | ICD-10-CM | POA: Diagnosis not present

## 2023-12-27 DIAGNOSIS — G4733 Obstructive sleep apnea (adult) (pediatric): Secondary | ICD-10-CM | POA: Diagnosis not present

## 2023-12-27 DIAGNOSIS — F319 Bipolar disorder, unspecified: Secondary | ICD-10-CM | POA: Diagnosis not present

## 2023-12-27 DIAGNOSIS — F1520 Other stimulant dependence, uncomplicated: Secondary | ICD-10-CM | POA: Diagnosis not present

## 2023-12-27 DIAGNOSIS — F4312 Post-traumatic stress disorder, chronic: Secondary | ICD-10-CM | POA: Diagnosis not present

## 2023-12-27 DIAGNOSIS — F909 Attention-deficit hyperactivity disorder, unspecified type: Secondary | ICD-10-CM | POA: Diagnosis not present

## 2023-12-28 DIAGNOSIS — F314 Bipolar disorder, current episode depressed, severe, without psychotic features: Secondary | ICD-10-CM | POA: Diagnosis not present

## 2023-12-29 ENCOUNTER — Other Ambulatory Visit (HOSPITAL_COMMUNITY): Payer: Self-pay

## 2023-12-29 DIAGNOSIS — F314 Bipolar disorder, current episode depressed, severe, without psychotic features: Secondary | ICD-10-CM | POA: Diagnosis not present

## 2024-01-01 DIAGNOSIS — F314 Bipolar disorder, current episode depressed, severe, without psychotic features: Secondary | ICD-10-CM | POA: Diagnosis not present

## 2024-01-02 DIAGNOSIS — F314 Bipolar disorder, current episode depressed, severe, without psychotic features: Secondary | ICD-10-CM | POA: Diagnosis not present

## 2024-01-03 DIAGNOSIS — F314 Bipolar disorder, current episode depressed, severe, without psychotic features: Secondary | ICD-10-CM | POA: Diagnosis not present

## 2024-01-04 DIAGNOSIS — F314 Bipolar disorder, current episode depressed, severe, without psychotic features: Secondary | ICD-10-CM | POA: Diagnosis not present

## 2024-01-05 DIAGNOSIS — F314 Bipolar disorder, current episode depressed, severe, without psychotic features: Secondary | ICD-10-CM | POA: Diagnosis not present

## 2024-01-08 DIAGNOSIS — F314 Bipolar disorder, current episode depressed, severe, without psychotic features: Secondary | ICD-10-CM | POA: Diagnosis not present

## 2024-01-09 DIAGNOSIS — F314 Bipolar disorder, current episode depressed, severe, without psychotic features: Secondary | ICD-10-CM | POA: Diagnosis not present

## 2024-01-10 DIAGNOSIS — F314 Bipolar disorder, current episode depressed, severe, without psychotic features: Secondary | ICD-10-CM | POA: Diagnosis not present

## 2024-01-11 ENCOUNTER — Other Ambulatory Visit: Payer: Self-pay | Admitting: Medical Genetics

## 2024-01-11 DIAGNOSIS — Z006 Encounter for examination for normal comparison and control in clinical research program: Secondary | ICD-10-CM

## 2024-01-11 DIAGNOSIS — F314 Bipolar disorder, current episode depressed, severe, without psychotic features: Secondary | ICD-10-CM | POA: Diagnosis not present

## 2024-01-12 DIAGNOSIS — F314 Bipolar disorder, current episode depressed, severe, without psychotic features: Secondary | ICD-10-CM | POA: Diagnosis not present

## 2024-01-15 ENCOUNTER — Other Ambulatory Visit (HOSPITAL_COMMUNITY): Payer: Self-pay

## 2024-01-15 DIAGNOSIS — F909 Attention-deficit hyperactivity disorder, unspecified type: Secondary | ICD-10-CM | POA: Diagnosis not present

## 2024-01-15 DIAGNOSIS — F1520 Other stimulant dependence, uncomplicated: Secondary | ICD-10-CM | POA: Diagnosis not present

## 2024-01-15 DIAGNOSIS — F4312 Post-traumatic stress disorder, chronic: Secondary | ICD-10-CM | POA: Diagnosis not present

## 2024-01-15 DIAGNOSIS — F319 Bipolar disorder, unspecified: Secondary | ICD-10-CM | POA: Diagnosis not present

## 2024-01-15 DIAGNOSIS — Z9152 Personal history of nonsuicidal self-harm: Secondary | ICD-10-CM | POA: Diagnosis not present

## 2024-01-15 DIAGNOSIS — F314 Bipolar disorder, current episode depressed, severe, without psychotic features: Secondary | ICD-10-CM | POA: Diagnosis not present

## 2024-01-15 MED ORDER — DIVALPROEX SODIUM ER 500 MG PO TB24
1500.0000 mg | ORAL_TABLET | Freq: Every evening | ORAL | 2 refills | Status: AC
  Filled 2024-01-15: qty 90, 30d supply, fill #0
  Filled 2024-02-20: qty 90, 30d supply, fill #1
  Filled 2024-03-20: qty 90, 30d supply, fill #2

## 2024-01-15 MED ORDER — AMPHETAMINE-DEXTROAMPHET ER 30 MG PO CP24
30.0000 mg | ORAL_CAPSULE | Freq: Every day | ORAL | 0 refills | Status: DC
  Filled 2024-01-23: qty 30, 30d supply, fill #0

## 2024-01-16 DIAGNOSIS — G4733 Obstructive sleep apnea (adult) (pediatric): Secondary | ICD-10-CM | POA: Diagnosis not present

## 2024-01-16 DIAGNOSIS — F314 Bipolar disorder, current episode depressed, severe, without psychotic features: Secondary | ICD-10-CM | POA: Diagnosis not present

## 2024-01-23 ENCOUNTER — Other Ambulatory Visit (HOSPITAL_COMMUNITY): Payer: Self-pay

## 2024-01-23 ENCOUNTER — Encounter (HOSPITAL_COMMUNITY): Payer: Self-pay

## 2024-01-24 ENCOUNTER — Other Ambulatory Visit: Payer: Self-pay

## 2024-01-24 ENCOUNTER — Other Ambulatory Visit (HOSPITAL_COMMUNITY): Payer: Self-pay

## 2024-01-26 DIAGNOSIS — E291 Testicular hypofunction: Secondary | ICD-10-CM | POA: Diagnosis not present

## 2024-01-30 DIAGNOSIS — R5382 Chronic fatigue, unspecified: Secondary | ICD-10-CM | POA: Diagnosis not present

## 2024-01-30 DIAGNOSIS — Z79899 Other long term (current) drug therapy: Secondary | ICD-10-CM | POA: Diagnosis not present

## 2024-01-30 DIAGNOSIS — G2581 Restless legs syndrome: Secondary | ICD-10-CM | POA: Diagnosis not present

## 2024-01-30 DIAGNOSIS — G4733 Obstructive sleep apnea (adult) (pediatric): Secondary | ICD-10-CM | POA: Diagnosis not present

## 2024-02-02 ENCOUNTER — Other Ambulatory Visit (HOSPITAL_COMMUNITY): Payer: Self-pay

## 2024-02-02 ENCOUNTER — Encounter (HOSPITAL_COMMUNITY): Payer: Self-pay

## 2024-02-02 DIAGNOSIS — E291 Testicular hypofunction: Secondary | ICD-10-CM | POA: Diagnosis not present

## 2024-02-02 MED ORDER — TESTOSTERONE CYPIONATE 200 MG/ML IM SOLN
50.0000 mg | INTRAMUSCULAR | 5 refills | Status: AC
  Filled 2024-02-02: qty 10, 28d supply, fill #0
  Filled 2024-02-05: qty 10, 70d supply, fill #0
  Filled 2024-02-20 – 2024-03-20 (×3): qty 4, 28d supply, fill #0

## 2024-02-05 ENCOUNTER — Other Ambulatory Visit (HOSPITAL_COMMUNITY): Payer: Self-pay

## 2024-02-05 DIAGNOSIS — M75121 Complete rotator cuff tear or rupture of right shoulder, not specified as traumatic: Secondary | ICD-10-CM | POA: Diagnosis not present

## 2024-02-05 MED ORDER — CYCLOBENZAPRINE HCL 5 MG PO TABS
5.0000 mg | ORAL_TABLET | Freq: Three times a day (TID) | ORAL | 0 refills | Status: DC | PRN
  Filled 2024-02-05: qty 30, 10d supply, fill #0

## 2024-02-20 ENCOUNTER — Other Ambulatory Visit: Payer: Self-pay

## 2024-02-20 ENCOUNTER — Other Ambulatory Visit (HOSPITAL_COMMUNITY): Payer: Self-pay

## 2024-02-21 ENCOUNTER — Other Ambulatory Visit (HOSPITAL_COMMUNITY): Payer: Self-pay

## 2024-02-27 ENCOUNTER — Other Ambulatory Visit (HOSPITAL_COMMUNITY): Payer: Self-pay

## 2024-02-28 ENCOUNTER — Other Ambulatory Visit (HOSPITAL_COMMUNITY): Payer: Self-pay

## 2024-02-28 DIAGNOSIS — Z9152 Personal history of nonsuicidal self-harm: Secondary | ICD-10-CM | POA: Diagnosis not present

## 2024-02-28 DIAGNOSIS — F19982 Other psychoactive substance use, unspecified with psychoactive substance-induced sleep disorder: Secondary | ICD-10-CM | POA: Diagnosis not present

## 2024-02-28 DIAGNOSIS — F4312 Post-traumatic stress disorder, chronic: Secondary | ICD-10-CM | POA: Diagnosis not present

## 2024-02-28 MED ORDER — CLONIDINE HCL 0.1 MG PO TABS
0.1000 mg | ORAL_TABLET | Freq: Every evening | ORAL | 2 refills | Status: AC
  Filled 2024-02-28 (×2): qty 30, 30d supply, fill #0
  Filled 2024-04-03: qty 30, 30d supply, fill #1

## 2024-02-28 MED ORDER — AMPHETAMINE-DEXTROAMPHET ER 30 MG PO CP24
30.0000 mg | ORAL_CAPSULE | Freq: Every day | ORAL | 0 refills | Status: AC
  Filled 2024-02-28 (×2): qty 30, 30d supply, fill #0

## 2024-02-28 MED ORDER — QUETIAPINE FUMARATE 50 MG PO TABS
50.0000 mg | ORAL_TABLET | Freq: Every evening | ORAL | 2 refills | Status: AC
  Filled 2024-02-28 (×2): qty 30, 30d supply, fill #0
  Filled 2024-04-03: qty 30, 30d supply, fill #1

## 2024-03-11 DIAGNOSIS — M75121 Complete rotator cuff tear or rupture of right shoulder, not specified as traumatic: Secondary | ICD-10-CM | POA: Diagnosis not present

## 2024-03-19 ENCOUNTER — Encounter (HOSPITAL_COMMUNITY): Payer: Self-pay | Admitting: Orthopaedic Surgery

## 2024-03-20 ENCOUNTER — Other Ambulatory Visit (HOSPITAL_COMMUNITY): Payer: Self-pay

## 2024-03-20 ENCOUNTER — Other Ambulatory Visit: Payer: Self-pay

## 2024-03-20 ENCOUNTER — Other Ambulatory Visit: Payer: Self-pay | Admitting: Psychiatry

## 2024-03-22 NOTE — Telephone Encounter (Signed)
Has new provider

## 2024-03-25 ENCOUNTER — Other Ambulatory Visit (HOSPITAL_COMMUNITY): Payer: Self-pay

## 2024-03-25 ENCOUNTER — Encounter (HOSPITAL_COMMUNITY): Payer: Self-pay

## 2024-04-03 ENCOUNTER — Other Ambulatory Visit: Payer: Self-pay

## 2024-04-03 ENCOUNTER — Other Ambulatory Visit (HOSPITAL_COMMUNITY): Payer: Self-pay

## 2024-04-03 MED ORDER — CYCLOBENZAPRINE HCL 5 MG PO TABS
5.0000 mg | ORAL_TABLET | Freq: Three times a day (TID) | ORAL | 0 refills | Status: AC | PRN
  Filled 2024-04-03: qty 30, 10d supply, fill #0

## 2024-04-05 ENCOUNTER — Other Ambulatory Visit (HOSPITAL_COMMUNITY): Payer: Self-pay

## 2024-04-09 ENCOUNTER — Other Ambulatory Visit (HOSPITAL_COMMUNITY): Payer: Self-pay

## 2024-04-10 ENCOUNTER — Other Ambulatory Visit (HOSPITAL_COMMUNITY): Payer: Self-pay

## 2024-04-17 ENCOUNTER — Other Ambulatory Visit (HOSPITAL_COMMUNITY): Payer: Self-pay

## 2024-04-17 MED ORDER — TRAMADOL HCL 50 MG PO TABS
50.0000 mg | ORAL_TABLET | Freq: Two times a day (BID) | ORAL | 0 refills | Status: AC | PRN
  Filled 2024-04-17: qty 20, 10d supply, fill #0

## 2024-04-18 ENCOUNTER — Other Ambulatory Visit: Payer: Self-pay | Admitting: Orthopedic Surgery

## 2024-04-23 ENCOUNTER — Encounter (HOSPITAL_COMMUNITY): Admission: RE | Admit: 2024-04-23

## 2024-05-02 ENCOUNTER — Ambulatory Visit (HOSPITAL_COMMUNITY): Admit: 2024-05-02 | Admitting: Orthopedic Surgery
